# Patient Record
Sex: Female | Born: 1935
Health system: Southern US, Community
[De-identification: ages and names within clinical notes are randomized; demographics above are authoritative.]

## PROBLEM LIST (undated history)

## (undated) DIAGNOSIS — T7840XA Allergy, unspecified, initial encounter: Secondary | ICD-10-CM

## (undated) DIAGNOSIS — C4491 Basal cell carcinoma of skin, unspecified: Secondary | ICD-10-CM

## (undated) DIAGNOSIS — I480 Paroxysmal atrial fibrillation: Secondary | ICD-10-CM

## (undated) DIAGNOSIS — B019 Varicella without complication: Secondary | ICD-10-CM

## (undated) DIAGNOSIS — I5189 Other ill-defined heart diseases: Secondary | ICD-10-CM

## (undated) DIAGNOSIS — Z9289 Personal history of other medical treatment: Secondary | ICD-10-CM

## (undated) DIAGNOSIS — R918 Other nonspecific abnormal finding of lung field: Secondary | ICD-10-CM

## (undated) DIAGNOSIS — R42 Dizziness and giddiness: Secondary | ICD-10-CM

## (undated) DIAGNOSIS — I2699 Other pulmonary embolism without acute cor pulmonale: Secondary | ICD-10-CM

## (undated) DIAGNOSIS — K449 Diaphragmatic hernia without obstruction or gangrene: Secondary | ICD-10-CM

## (undated) DIAGNOSIS — Z87442 Personal history of urinary calculi: Secondary | ICD-10-CM

## (undated) DIAGNOSIS — M1990 Unspecified osteoarthritis, unspecified site: Secondary | ICD-10-CM

## (undated) DIAGNOSIS — R55 Syncope and collapse: Secondary | ICD-10-CM

## (undated) DIAGNOSIS — K219 Gastro-esophageal reflux disease without esophagitis: Secondary | ICD-10-CM

## (undated) DIAGNOSIS — J45909 Unspecified asthma, uncomplicated: Secondary | ICD-10-CM

## (undated) HISTORY — DX: Unspecified asthma, uncomplicated: J45.909

## (undated) HISTORY — DX: Varicella without complication: B01.9

## (undated) HISTORY — DX: Personal history of other medical treatment: Z92.89

## (undated) HISTORY — DX: Personal history of urinary calculi: Z87.442

## (undated) HISTORY — DX: Other nonspecific abnormal finding of lung field: R91.8

## (undated) HISTORY — DX: Gastro-esophageal reflux disease without esophagitis: K21.9

## (undated) HISTORY — DX: Other pulmonary embolism without acute cor pulmonale: I26.99

## (undated) HISTORY — DX: Allergy, unspecified, initial encounter: T78.40XA

## (undated) HISTORY — DX: Paroxysmal atrial fibrillation: I48.0

## (undated) HISTORY — DX: Diaphragmatic hernia without obstruction or gangrene: K44.9

## (undated) HISTORY — DX: Unspecified osteoarthritis, unspecified site: M19.90

---
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
## 1969-09-25 HISTORY — PX: VEIN LIGATION AND STRIPPING: SHX2653

## 1973-09-25 HISTORY — PX: NEPHRECTOMY: SHX65

## 1973-09-25 HISTORY — PX: DILATION AND CURETTAGE OF UTERUS: SHX78

## 2010-11-24 LAB — HM PAP SMEAR: HM PAP: NORMAL

## 2010-11-24 LAB — HM MAMMOGRAPHY: HM MAMMO: NORMAL

## 2011-09-26 HISTORY — PX: REPLACEMENT TOTAL KNEE BILATERAL: SUR1225

## 2013-11-18 DIAGNOSIS — J45909 Unspecified asthma, uncomplicated: Secondary | ICD-10-CM | POA: Diagnosis not present

## 2013-11-18 DIAGNOSIS — E785 Hyperlipidemia, unspecified: Secondary | ICD-10-CM | POA: Diagnosis not present

## 2013-11-18 DIAGNOSIS — M129 Arthropathy, unspecified: Secondary | ICD-10-CM | POA: Diagnosis not present

## 2013-11-18 DIAGNOSIS — I10 Essential (primary) hypertension: Secondary | ICD-10-CM | POA: Diagnosis not present

## 2013-11-19 DIAGNOSIS — L570 Actinic keratosis: Secondary | ICD-10-CM | POA: Diagnosis not present

## 2013-11-19 DIAGNOSIS — D235 Other benign neoplasm of skin of trunk: Secondary | ICD-10-CM | POA: Diagnosis not present

## 2013-11-19 DIAGNOSIS — L820 Inflamed seborrheic keratosis: Secondary | ICD-10-CM | POA: Diagnosis not present

## 2013-11-27 DIAGNOSIS — E785 Hyperlipidemia, unspecified: Secondary | ICD-10-CM | POA: Diagnosis not present

## 2013-11-27 DIAGNOSIS — I10 Essential (primary) hypertension: Secondary | ICD-10-CM | POA: Diagnosis not present

## 2013-11-27 DIAGNOSIS — R42 Dizziness and giddiness: Secondary | ICD-10-CM | POA: Diagnosis not present

## 2013-12-02 DIAGNOSIS — R911 Solitary pulmonary nodule: Secondary | ICD-10-CM | POA: Diagnosis not present

## 2013-12-02 DIAGNOSIS — R918 Other nonspecific abnormal finding of lung field: Secondary | ICD-10-CM | POA: Diagnosis not present

## 2013-12-04 DIAGNOSIS — J449 Chronic obstructive pulmonary disease, unspecified: Secondary | ICD-10-CM | POA: Diagnosis not present

## 2013-12-04 DIAGNOSIS — J45909 Unspecified asthma, uncomplicated: Secondary | ICD-10-CM | POA: Diagnosis not present

## 2013-12-04 DIAGNOSIS — J984 Other disorders of lung: Secondary | ICD-10-CM | POA: Diagnosis not present

## 2013-12-31 DIAGNOSIS — D34 Benign neoplasm of thyroid gland: Secondary | ICD-10-CM | POA: Diagnosis not present

## 2013-12-31 DIAGNOSIS — E042 Nontoxic multinodular goiter: Secondary | ICD-10-CM | POA: Diagnosis not present

## 2013-12-31 DIAGNOSIS — E049 Nontoxic goiter, unspecified: Secondary | ICD-10-CM | POA: Diagnosis not present

## 2014-01-01 DIAGNOSIS — H6120 Impacted cerumen, unspecified ear: Secondary | ICD-10-CM | POA: Diagnosis not present

## 2014-01-06 DIAGNOSIS — J309 Allergic rhinitis, unspecified: Secondary | ICD-10-CM | POA: Diagnosis not present

## 2014-01-06 DIAGNOSIS — J45909 Unspecified asthma, uncomplicated: Secondary | ICD-10-CM | POA: Diagnosis not present

## 2014-01-06 DIAGNOSIS — J069 Acute upper respiratory infection, unspecified: Secondary | ICD-10-CM | POA: Diagnosis not present

## 2014-01-06 DIAGNOSIS — I10 Essential (primary) hypertension: Secondary | ICD-10-CM | POA: Diagnosis not present

## 2014-02-03 DIAGNOSIS — D235 Other benign neoplasm of skin of trunk: Secondary | ICD-10-CM | POA: Diagnosis not present

## 2014-02-03 DIAGNOSIS — L988 Other specified disorders of the skin and subcutaneous tissue: Secondary | ICD-10-CM | POA: Diagnosis not present

## 2014-02-03 DIAGNOSIS — D485 Neoplasm of uncertain behavior of skin: Secondary | ICD-10-CM | POA: Diagnosis not present

## 2014-02-03 DIAGNOSIS — R21 Rash and other nonspecific skin eruption: Secondary | ICD-10-CM | POA: Diagnosis not present

## 2014-04-20 DIAGNOSIS — R21 Rash and other nonspecific skin eruption: Secondary | ICD-10-CM | POA: Diagnosis not present

## 2014-04-28 DIAGNOSIS — R42 Dizziness and giddiness: Secondary | ICD-10-CM | POA: Diagnosis not present

## 2014-04-28 DIAGNOSIS — I10 Essential (primary) hypertension: Secondary | ICD-10-CM | POA: Diagnosis not present

## 2014-04-28 DIAGNOSIS — E042 Nontoxic multinodular goiter: Secondary | ICD-10-CM | POA: Diagnosis not present

## 2014-04-28 DIAGNOSIS — E785 Hyperlipidemia, unspecified: Secondary | ICD-10-CM | POA: Diagnosis not present

## 2014-04-28 DIAGNOSIS — J45909 Unspecified asthma, uncomplicated: Secondary | ICD-10-CM | POA: Diagnosis not present

## 2014-04-28 DIAGNOSIS — D34 Benign neoplasm of thyroid gland: Secondary | ICD-10-CM | POA: Diagnosis not present

## 2014-05-12 DIAGNOSIS — R21 Rash and other nonspecific skin eruption: Secondary | ICD-10-CM | POA: Diagnosis not present

## 2014-05-14 DIAGNOSIS — L259 Unspecified contact dermatitis, unspecified cause: Secondary | ICD-10-CM | POA: Diagnosis not present

## 2014-05-15 DIAGNOSIS — Z471 Aftercare following joint replacement surgery: Secondary | ICD-10-CM | POA: Diagnosis not present

## 2014-05-15 DIAGNOSIS — R21 Rash and other nonspecific skin eruption: Secondary | ICD-10-CM | POA: Diagnosis not present

## 2014-06-15 DIAGNOSIS — J45909 Unspecified asthma, uncomplicated: Secondary | ICD-10-CM | POA: Diagnosis not present

## 2014-06-15 DIAGNOSIS — I9589 Other hypotension: Secondary | ICD-10-CM | POA: Diagnosis not present

## 2014-06-15 DIAGNOSIS — R42 Dizziness and giddiness: Secondary | ICD-10-CM | POA: Diagnosis not present

## 2014-06-15 DIAGNOSIS — T50904A Poisoning by unspecified drugs, medicaments and biological substances, undetermined, initial encounter: Secondary | ICD-10-CM | POA: Diagnosis not present

## 2014-07-30 ENCOUNTER — Encounter: Payer: Self-pay | Admitting: Internal Medicine

## 2014-07-30 ENCOUNTER — Ambulatory Visit (INDEPENDENT_AMBULATORY_CARE_PROVIDER_SITE_OTHER): Payer: Medicare Other | Admitting: Internal Medicine

## 2014-07-30 VITALS — BP 146/78 | HR 76 | Temp 97.9°F | Wt 193.0 lb

## 2014-07-30 DIAGNOSIS — J309 Allergic rhinitis, unspecified: Secondary | ICD-10-CM | POA: Diagnosis not present

## 2014-07-30 DIAGNOSIS — R05 Cough: Secondary | ICD-10-CM

## 2014-07-30 DIAGNOSIS — R059 Cough, unspecified: Secondary | ICD-10-CM

## 2014-07-30 MED ORDER — HYDROCODONE-HOMATROPINE 5-1.5 MG/5ML PO SYRP: 5.0000 mL | ORAL_SOLUTION | Freq: Three times a day (TID) | ORAL | Status: DC | PRN

## 2014-07-30 NOTE — Progress Notes (Signed)
HPI  Pt presents to the clinic today with c/o cough x 1 week. She has had some chest congestion and nasal congestion. She is producing mucous from her nose and her cough. It is clear/white. She reports that she has had fever and chills. She does not have any other URI symptoms. She has tried Delsym and saline nasal rinse. She does have a history of seasonal allergies. She is on Claritin and Flonase.  Review of Systems     No past medical history on file.  No family history on file.  History   Social History  . Marital Status: Widowed    Spouse Name: N/A    Number of Children: N/A  . Years of Education: N/A   Occupational History  . Not on file.   Social History Main Topics  . Smoking status: Never Smoker   . Smokeless tobacco: Never Used  . Alcohol Use: 0.0 oz/week    0 Not specified per week     Comment: rare--wine  . Drug Use: Not on file  . Sexual Activity: Not on file   Other Topics Concern  . Not on file   Social History Narrative  . No narrative on file    No Known Allergies   Constitutional: Positive headache, fatigue and fever. Denies abrupt weight changes.  HEENT:  Positive nasal congestion. Denies eye redness, eye pain, pressure behind the eyes, facial pain, ear pain, ringing in the ears, wax buildup, runny nose or bloody nose. Respiratory: Positive cough. Denies difficulty breathing or shortness of breath.  Cardiovascular: Denies chest pain, chest tightness, palpitations or swelling in the hands or feet.   No other specific complaints in a complete review of systems (except as listed in HPI above).  Objective:   BP 146/78 mmHg  Pulse 76  Temp(Src) 97.9 F (36.6 C) (Oral)  Wt 193 lb (87.544 kg)  SpO2 97%  Wt Readings from Last 3 Encounters:  07/30/14 193 lb (87.544 kg)     General: Appears her stated age, well developed, well nourished in NAD. HEENT: Head: normal shape and size; Eyes: sclera slightly injected, no icterus, conjunctiva pink,  PERRLA and EOMs intact; Ears: Tm's gray and intact, normal light reflex; Nose: mucosa pink and moist, septum midline; Throat/Mouth: + PND. Teeth present, mucosa pink and moist, no exudate noted, no lesions or ulcerations noted.  Cardiovascular: Normal rate and rhythm. S1,S2 noted.  No murmur, rubs or gallops noted.. Pulmonary/Chest: Normal effort and positive vesicular breath sounds. No respiratory distress. No wheezes, rales or ronchi noted.      Assessment & Plan:   Cough, secondary to allergies:  Get some rest and drink plenty of water Continue your Claritin and Flonase Rx for Hycodan cough syrup  Watch for fever, colored mucous or worsening cough with shortness of breath  RTC as needed or if symptoms persist.

## 2014-07-30 NOTE — Patient Instructions (Signed)
Cough, Adult  A cough is a reflex that helps clear your throat and airways. It can help heal the body or may be a reaction to an irritated airway. A cough may only last 2 or 3 weeks (acute) or may last more than 8 weeks (chronic).  CAUSES Acute cough:  Viral or bacterial infections. Chronic cough:  Infections.  Allergies.  Asthma.  Post-nasal drip.  Smoking.  Heartburn or acid reflux.  Some medicines.  Chronic lung problems (COPD).  Cancer. SYMPTOMS   Cough.  Fever.  Chest pain.  Increased breathing rate.  High-pitched whistling sound when breathing (wheezing).  Colored mucus that you cough up (sputum). TREATMENT   A bacterial cough may be treated with antibiotic medicine.  A viral cough must run its course and will not respond to antibiotics.  Your caregiver may recommend other treatments if you have a chronic cough. HOME CARE INSTRUCTIONS   Only take over-the-counter or prescription medicines for pain, discomfort, or fever as directed by your caregiver. Use cough suppressants only as directed by your caregiver.  Use a cold steam vaporizer or humidifier in your bedroom or home to help loosen secretions.  Sleep in a semi-upright position if your cough is worse at night.  Rest as needed.  Stop smoking if you smoke. SEEK IMMEDIATE MEDICAL CARE IF:   You have pus in your sputum.  Your cough starts to worsen.  You cannot control your cough with suppressants and are losing sleep.  You begin coughing up blood.  You have difficulty breathing.  You develop pain which is getting worse or is uncontrolled with medicine.  You have a fever. MAKE SURE YOU:   Understand these instructions.  Will watch your condition.  Will get help right away if you are not doing well or get worse. Document Released: 03/10/2011 Document Revised: 12/04/2011 Document Reviewed: 03/10/2011 Exit[employer] Patient Information 2015 [employer]. This information is not intended  to replace advice given to you by your health care provider. Make sure you discuss any questions you have with your health care provider.  

## 2014-08-13 ENCOUNTER — Encounter: Payer: Self-pay | Admitting: Internal Medicine

## 2014-08-13 ENCOUNTER — Ambulatory Visit (INDEPENDENT_AMBULATORY_CARE_PROVIDER_SITE_OTHER): Payer: Medicare Other | Admitting: Internal Medicine

## 2014-08-13 VITALS — BP 120/76 | HR 75 | Temp 97.9°F | Wt 192.0 lb

## 2014-08-13 DIAGNOSIS — J309 Allergic rhinitis, unspecified: Secondary | ICD-10-CM

## 2014-08-13 DIAGNOSIS — Z9189 Other specified personal risk factors, not elsewhere classified: Secondary | ICD-10-CM

## 2014-08-13 DIAGNOSIS — Z87898 Personal history of other specified conditions: Secondary | ICD-10-CM | POA: Diagnosis not present

## 2014-08-13 DIAGNOSIS — Z23 Encounter for immunization: Secondary | ICD-10-CM

## 2014-08-13 MED ORDER — PREDNISONE 10 MG PO TABS: ORAL_TABLET | ORAL | Status: DC

## 2014-08-13 MED ORDER — AMOXICILLIN 500 MG PO CAPS: ORAL_CAPSULE | ORAL | Status: DC

## 2014-08-13 NOTE — Progress Notes (Signed)
HPI  Pt presents to the clinic today with c/o cough and chest congestion. This has now been going on for the last 4 weeks. She was seen 11/5 for the same, advised this was allergies. She was encouraged to take Claritin and Flonase. She was given Hycodan which did help her cough. However, the cough is still productive of clear/white mucous. She has also tried Delsym and Mucinex. She denies fever, chills or body aches. She has not had sick contacts. She has no history of breathing problems. She is being followed by a pulmonologist because she has "spots on her lungs"  Review of Systems     No past medical history on file.  No family history on file.  History   Social History  . Marital Status: Widowed    Spouse Name: N/A    Number of Children: N/A  . Years of Education: N/A   Occupational History  . Not on file.   Social History Main Topics  . Smoking status: Never Smoker   . Smokeless tobacco: Never Used  . Alcohol Use: 0.0 oz/week    0 Not specified per week     Comment: rare--wine  . Drug Use: Not on file  . Sexual Activity: Not on file   Other Topics Concern  . Not on file   Social History Narrative    No Known Allergies   Constitutional:  Denies headache, fatigue, fever or abrupt weight changes.  HEENT:  Denies eye redness, eye pain, pressure behind the eyes, facial pain, nasal congestion, ear pain, ringing in the ears, wax buildup, runny nose or sore throat. Respiratory: Positive cough, chest congestion or shortness of breath. Denies difficulty breathing or shortness of breath.  Cardiovascular: Denies chest pain, chest tightness, palpitations or swelling in the hands or feet.   No other specific complaints in a complete review of systems (except as listed in HPI above).  Objective:   BP 120/76 mmHg  Pulse 75  Temp(Src) 97.9 F (36.6 C) (Oral)  Wt 192 lb (87.091 kg)  SpO2 98%  Wt Readings from Last 3 Encounters:  08/13/14 192 lb (87.091 kg)  07/30/14 193  lb (87.544 kg)     General: Appears her stated age, well developed, well nourished in NAD. HEENT: Head: normal shape and size;  Ears: Tm's gray and intact, normal light reflex; Nose: mucosa pink and moist, septum midline; Throat/Mouth:Teeth present, mucosa pink and moist, no exudate noted, no lesions or ulcerations noted.  Cardiovascular: Normal rate and rhythm. S1,S2 noted.  No murmur, rubs or gallops noted.  Pulmonary/Chest: Normal effort and diminished vesicular breath sounds. No respiratory distress. No wheezes, rales or ronchi noted.      Assessment & Plan:   Allergic Rhinitis:  Get some rest and drink plenty of water eRx for pred taper Continue Delsym OTC for cough Flu shot today  Prophylaxis prior to dental procedure:  eRx for Amoxil 2 gm PO x 1 30 minutes prior to dental procedure  RTC as needed or if symptoms persist.

## 2014-08-13 NOTE — Patient Instructions (Signed)
Cough, Adult  A cough is a reflex that helps clear your throat and airways. It can help heal the body or may be a reaction to an irritated airway. A cough may only last 2 or 3 weeks (acute) or may last more than 8 weeks (chronic).  CAUSES Acute cough:  Viral or bacterial infections. Chronic cough:  Infections.  Allergies.  Asthma.  Post-nasal drip.  Smoking.  Heartburn or acid reflux.  Some medicines.  Chronic lung problems (COPD).  Cancer. SYMPTOMS   Cough.  Fever.  Chest pain.  Increased breathing rate.  High-pitched whistling sound when breathing (wheezing).  Colored mucus that you cough up (sputum). TREATMENT   A bacterial cough may be treated with antibiotic medicine.  A viral cough must run its course and will not respond to antibiotics.  Your caregiver may recommend other treatments if you have a chronic cough. HOME CARE INSTRUCTIONS   Only take over-the-counter or prescription medicines for pain, discomfort, or fever as directed by your caregiver. Use cough suppressants only as directed by your caregiver.  Use a cold steam vaporizer or humidifier in your bedroom or home to help loosen secretions.  Sleep in a semi-upright position if your cough is worse at night.  Rest as needed.  Stop smoking if you smoke. SEEK IMMEDIATE MEDICAL CARE IF:   You have pus in your sputum.  Your cough starts to worsen.  You cannot control your cough with suppressants and are losing sleep.  You begin coughing up blood.  You have difficulty breathing.  You develop pain which is getting worse or is uncontrolled with medicine.  You have a fever. MAKE SURE YOU:   Understand these instructions.  Will watch your condition.  Will get help right away if you are not doing well or get worse. Document Released: 03/10/2011 Document Revised: 12/04/2011 Document Reviewed: 03/10/2011 Exit[employer] Patient Information 2015 [employer]. This information is not intended  to replace advice given to you by your health care provider. Make sure you discuss any questions you have with your health care provider.  

## 2014-08-13 NOTE — Progress Notes (Signed)
Pre visit review using our clinic review tool, if applicable. No additional management support is needed unless otherwise documented below in the visit note. 

## 2014-09-10 ENCOUNTER — Ambulatory Visit (INDEPENDENT_AMBULATORY_CARE_PROVIDER_SITE_OTHER): Payer: Medicare Other | Admitting: Internal Medicine

## 2014-09-10 ENCOUNTER — Encounter: Payer: Self-pay | Admitting: Internal Medicine

## 2014-09-10 VITALS — BP 124/76 | HR 72 | Temp 98.0°F | Ht 66.25 in | Wt 194.0 lb

## 2014-09-10 DIAGNOSIS — J309 Allergic rhinitis, unspecified: Secondary | ICD-10-CM | POA: Diagnosis not present

## 2014-09-10 DIAGNOSIS — E785 Hyperlipidemia, unspecified: Secondary | ICD-10-CM

## 2014-09-10 DIAGNOSIS — K219 Gastro-esophageal reflux disease without esophagitis: Secondary | ICD-10-CM | POA: Diagnosis not present

## 2014-09-10 DIAGNOSIS — Z87898 Personal history of other specified conditions: Secondary | ICD-10-CM | POA: Diagnosis not present

## 2014-09-10 DIAGNOSIS — M1990 Unspecified osteoarthritis, unspecified site: Secondary | ICD-10-CM

## 2014-09-10 DIAGNOSIS — I10 Essential (primary) hypertension: Secondary | ICD-10-CM | POA: Diagnosis not present

## 2014-09-10 DIAGNOSIS — Z9189 Other specified personal risk factors, not elsewhere classified: Secondary | ICD-10-CM

## 2014-09-10 LAB — COMPREHENSIVE METABOLIC PANEL WITH GFR
ALT: 15 U/L (ref 0–35)
AST: 16 U/L (ref 0–37)
Albumin: 3.7 g/dL (ref 3.5–5.2)
Alkaline Phosphatase: 51 U/L (ref 39–117)
BUN: 16 mg/dL (ref 6–23)
CO2: 25 meq/L (ref 19–32)
Calcium: 9.2 mg/dL (ref 8.4–10.5)
Chloride: 105 meq/L (ref 96–112)
Creatinine, Ser: 0.8 mg/dL (ref 0.4–1.2)
GFR: 78.01 mL/min
Glucose, Bld: 89 mg/dL (ref 70–99)
Potassium: 4.6 meq/L (ref 3.5–5.1)
Sodium: 139 meq/L (ref 135–145)
Total Bilirubin: 0.5 mg/dL (ref 0.2–1.2)
Total Protein: 6.6 g/dL (ref 6.0–8.3)

## 2014-09-10 LAB — CBC
HCT: 36.5 % (ref 36.0–46.0)
Hemoglobin: 11.9 g/dL — ABNORMAL LOW (ref 12.0–15.0)
MCHC: 32.7 g/dL (ref 30.0–36.0)
MCV: 86.7 fl (ref 78.0–100.0)
Platelets: 215 10*3/uL (ref 150.0–400.0)
RBC: 4.21 Mil/uL (ref 3.87–5.11)
RDW: 13.5 % (ref 11.5–15.5)
WBC: 4.6 10*3/uL (ref 4.0–10.5)

## 2014-09-10 LAB — LIPID PANEL
Cholesterol: 140 mg/dL (ref 0–200)
HDL: 51.7 mg/dL
LDL Cholesterol: 68 mg/dL (ref 0–99)
NonHDL: 88.3
Total CHOL/HDL Ratio: 3
Triglycerides: 102 mg/dL (ref 0.0–149.0)
VLDL: 20.4 mg/dL (ref 0.0–40.0)

## 2014-09-10 MED ORDER — LEVOCETIRIZINE DIHYDROCHLORIDE 5 MG PO TABS: 5.0000 mg | ORAL_TABLET | Freq: Every evening | ORAL | Status: DC

## 2014-09-10 MED ORDER — AMOXICILLIN 500 MG PO CAPS: ORAL_CAPSULE | ORAL | Status: DC

## 2014-09-10 MED ORDER — FLUTICASONE PROPIONATE 50 MCG/ACT NA SUSP: 2.0000 | Freq: Every day | NASAL | Status: DC

## 2014-09-10 NOTE — Assessment & Plan Note (Signed)
Occasional She does not want to go back on the PPI- I agree Continue Gaviscon prn

## 2014-09-10 NOTE — Assessment & Plan Note (Signed)
Well controlled on celebrex and tylenol Will check CBC and CMET today

## 2014-09-10 NOTE — Assessment & Plan Note (Signed)
Controlled off meds Will check CBC and CMET today

## 2014-09-10 NOTE — Assessment & Plan Note (Signed)
Flaring Will switch claritin to xyzal Increase Flonase to BID Continue pataday

## 2014-09-10 NOTE — Assessment & Plan Note (Signed)
No issues on zocor Will check CMET and Lipid profile today

## 2014-09-10 NOTE — Progress Notes (Signed)
HPI  Pt presents to the clinic today to establish care.  Asthma: Symptoms controlled on Flovent. Rarely uses albuterol.  Arthritis: Fair control with celebrex. Will take Tylenol occasionally in addition as needed.  GERD: Diet controlled. She was taken off her PPI last year and has been doing well since. Will take Gaviscon about once per week.  Allergies: Symptoms controlled on Claritin, Flonase and Pataday.  HTN: Not medicated.  HLD: She denies myalgias on Zocor.  She does have some concerns today about sinus issues. These did completely clear up with the prednisone taper. She feels a pressure in her sinus but denies blowing colored mucous out of her nose. She denies fever, chills or body aches. She has been taking her prescription medications only.  Flu: 2015 Tetanus: 2011 Pneumovax: 2013 Prevnar: unsure Zostovax: 2013 Pap Smear: 2012 Mammogram: 2012 Bone Density: 2012 Colon Screening: more than 10 years ago Vision Screening: 2014 Dentist: biannually  Past Medical History  Diagnosis Date  . Asthma   . Arthritis   . Chicken pox   . GERD (gastroesophageal reflux disease)   . Allergy   . Hypertension   . History of kidney stones   . Pulmonary embolism on left     Current Outpatient Prescriptions  Medication Sig Dispense Refill  . acetaminophen (TYLENOL) 500 MG tablet Take 500 mg by mouth as needed.    Marland Kitchen albuterol (PROVENTIL HFA;VENTOLIN HFA) 108 (90 BASE) MCG/ACT inhaler Inhale 2 puffs into the lungs every 4 (four) hours as needed for wheezing or shortness of breath.    . Alum Hydroxide-Mag Carbonate (GAVISCON EXTRA STRENGTH) 160-105 MG CHEW Chew 2 tablets by mouth as needed.    Marland Kitchen aspirin 81 MG tablet Take 81 mg by mouth daily.    . celecoxib (CELEBREX) 200 MG capsule Take 200 mg by mouth daily.    . clobetasol cream (TEMOVATE) 4.69 % Apply 1 application topically 2 (two) times daily.    Marland Kitchen docusate sodium (COLACE) 100 MG capsule Take 100 mg by mouth 2 (two) times daily  as needed for mild constipation.    . Fish Oil-Cholecalciferol (OMEGA-3 FISH OIL-VITAMIN D3) 1200-1000 MG-UNIT CAPS Take 1 capsule by mouth daily.    . fluticasone (FLONASE) 50 MCG/ACT nasal spray Place 2 sprays into both nostrils daily.    . fluticasone (FLOVENT HFA) 110 MCG/ACT inhaler Inhale 2 puffs into the lungs 2 (two) times daily.    Donnie Aho (GLUCOSAMINE MSM COMPLEX) TABS Take 1 tablet by mouth 2 (two) times daily.    Marland Kitchen loratadine (CLARITIN) 10 MG tablet Take 10 mg by mouth daily.    . Melatonin 5 MG CAPS Take 1 capsule by mouth at bedtime.    . Multiple Vitamins-Minerals (ONE DAILY MULTIVITAMIN WOMEN PO) Take 1 tablet by mouth daily.    . Olopatadine HCl (PATADAY) 0.2 % SOLN Apply 1 drop to eye daily.    . Polyethylene Glycol 3350 GRAN 17 g by Does not apply route as needed.    . simvastatin (ZOCOR) 10 MG tablet Take 10 mg by mouth daily.    . Wheat Dextrin (BENEFIBER) POWD Take 4 g by mouth as needed.     No current facility-administered medications for this visit.    No Known Allergies  History reviewed. No pertinent family history.  History   Social History  . Marital Status: Widowed    Spouse Name: N/A    Number of Children: N/A  . Years of Education: N/A   Occupational History  .  Not on file.   Social History Main Topics  . Smoking status: Never Smoker   . Smokeless tobacco: Never Used  . Alcohol Use: 0.0 oz/week    0 Not specified per week     Comment: rare--wine  . Drug Use: Not on file  . Sexual Activity: Not on file   Other Topics Concern  . Not on file   Social History Narrative    ROS:  Constitutional: Denies fever, malaise, fatigue, headache or abrupt weight changes.  HEENT: Pt reports runny nose and sinus pressure. Denies eye pain, eye redness, ear pain, ringing in the ears, wax buildup, nasal congestion, bloody nose, or sore throat. Respiratory: Denies difficulty breathing, shortness of breath, cough or sputum production.    Cardiovascular: Denies chest pain, chest tightness, palpitations or swelling in the hands or feet.  Gastrointestinal: Denies abdominal pain, bloating, constipation, diarrhea or blood in the stool.  GU: Denies frequency, urgency, pain with urination, blood in urine, odor or discharge. Musculoskeletal: Pt reports joint pains. Denies decrease in range of motion, difficulty with gait, muscle pain.  Skin: Denies redness, rashes, lesions or ulcercations.  Neurological: Denies dizziness, difficulty with memory, difficulty with speech or problems with balance and coordination.   No other specific complaints in a complete review of systems (except as listed in HPI above).  PE:  Ht 5' 6.25" (1.683 m)  Wt 194 lb (87.998 kg)  BMI 31.07 kg/m2 Wt Readings from Last 3 Encounters:  09/10/14 194 lb (87.998 kg)  08/13/14 192 lb (87.091 kg)  07/30/14 193 lb (87.544 kg)    General: Appears herstated age, chronically ill appearing in NAD. HEENT: Head: normal shape and size, no sinus tenderness noted; Eyes: sclera white, no icterus, conjunctiva pinkt; Ears: Tm's gray and intact, normal light reflex; Nose: mucosa boggy and moist, septum midline; Throat/Mouth: + PND, mucosa pink and moist, no lesions or ulcerations noted.  Neck: No adenopathy noted. Cardiovascular: Normal rate and rhythm. S1,S2 noted.  No murmur, rubs or gallops noted.  No carotid bruits noted. Pulmonary/Chest: Normal effort and positive vesicular breath sounds. No respiratory distress. No wheezes, rales or ronchi noted.  Abdomen: Soft and nontender. Normal bowel sounds, no bruits noted. No distention or masses noted.  Musculoskeletal: Bilateral joint deformity of hands. Strength 5/5 BUE/BLE.  No difficulty with gait.  Neurological: Alert and oriented. Cranial nerves II-XII grossly intact. Coordination normal.     Assessment and Plan:

## 2014-09-10 NOTE — Progress Notes (Signed)
Pre visit review using our clinic review tool, if applicable. No additional management support is needed unless otherwise documented below in the visit note. 

## 2014-09-10 NOTE — Patient Instructions (Signed)

## 2014-09-10 NOTE — Addendum Note (Signed)
Addended by: Lurlean Nanny on: 09/10/2014 11:03 AM   Modules accepted: Orders

## 2014-09-11 ENCOUNTER — Telehealth: Payer: Self-pay | Admitting: Internal Medicine

## 2014-09-11 NOTE — Telephone Encounter (Signed)
emmi mailed  °

## 2014-09-14 ENCOUNTER — Other Ambulatory Visit: Payer: Self-pay | Admitting: Internal Medicine

## 2014-09-14 NOTE — Telephone Encounter (Signed)
Last filled 09/10/14--please advise

## 2014-09-21 ENCOUNTER — Ambulatory Visit (INDEPENDENT_AMBULATORY_CARE_PROVIDER_SITE_OTHER): Payer: Medicare Other | Admitting: Family Medicine

## 2014-09-21 ENCOUNTER — Encounter: Payer: Self-pay | Admitting: Family Medicine

## 2014-09-21 VITALS — BP 136/82 | HR 78 | Temp 98.0°F | Wt 190.0 lb

## 2014-09-21 DIAGNOSIS — J441 Chronic obstructive pulmonary disease with (acute) exacerbation: Secondary | ICD-10-CM | POA: Diagnosis not present

## 2014-09-21 DIAGNOSIS — J189 Pneumonia, unspecified organism: Secondary | ICD-10-CM

## 2014-09-21 MED ORDER — PREDNISONE 20 MG PO TABS: ORAL_TABLET | ORAL | Status: DC

## 2014-09-21 MED ORDER — LEVOFLOXACIN 500 MG PO TABS: 500.0000 mg | ORAL_TABLET | Freq: Every day | ORAL | Status: DC

## 2014-09-21 NOTE — Progress Notes (Signed)
Pre visit review using our clinic review tool, if applicable. No additional management support is needed unless otherwise documented below in the visit note. 

## 2014-09-21 NOTE — Progress Notes (Signed)
Dr. Frederico Hamman T. Myson Levi, MD, [facility] [place], [address] Phone: [phone] Fax: [phone]  09/21/2014  Patient: Julie Davies, MRN: [id number], DOB: 09-16-1927, 80 y.o.  Primary Physician:  Webb Silversmith, NP  Chief Complaint: Cough and Dizziness  Subjective:   Julie Davies is a 80 y.o. very pleasant female patient who presents with the following:  Patient to me who has had what sounds like a history of COPD initially diagnosed at the age of 27. She was initially told her this was asthma, but she recently saw a pulmonologist in [facility] who felt like she more likely had COPD. She does not have a history of smoking since the 1950s, but she was in a household full smokers.  Coughing for 2 months. This morning got up and had to call son in law and had a dizzy spell. "Like tunnel vision and shaky." Has had them before.   COPD / Emphysema - ? If developed in the late 60's / 70's.  But more than 10 days ago she was on the low dose of some prednisone, and this made her feel somewhat better. She is currently afebrile.  Past Medical History, Surgical History, Social History, Family History, Problem List, Medications, and Allergies have been reviewed and updated if relevant.  ROS: GEN: Acute illness details above GI: Tolerating PO intake GU: maintaining adequate hydration and urination Pulm: No SOB Interactive and getting along well at home.  Otherwise, ROS is as per the HPI.   Objective:   BP 136/82 mmHg  Pulse 78  Temp(Src) 98 F (36.7 C) (Oral)  Wt 190 lb (86.183 kg)  SpO2 98%   GEN: A and O x 3. WDWN. NAD.    ENT: Nose clear, ext NML.  No LAD.  No JVD.  TM's clear. Oropharynx clear.  PULM: Normal WOB, no distress. No crackles, wheezes, rhonchi. CV: RRR, no M/G/R, No rubs, No JVD.   EXT: warm and well-perfused, No c/c/e. PSYCH: Pleasant and conversant.    Laboratory and Imaging  Data:  Assessment and Plan:   COPD exacerbation  Atypical pneumonia   Suspect that this is a COPD exacerbation that has a prolonged prior illness from either a viral bronchitis or atypical pneumonia. Pertussis is also possible but less likely. History more impressive than exam  She may have incompletely treated COPD, but her history is that 6 months ago or so ago she  Was not significantly short of breath.  She recently stopped her PPI, and GERD is often a cause of chronic coughs. If her symptoms do not improve, then I would restart this.  New Prescriptions   LEVOFLOXACIN (LEVAQUIN) 500 MG TABLET    Take 1 tablet (500 mg total) by mouth daily.   PREDNISONE (DELTASONE) 20 MG TABLET    2 tabs po daily for 5 days, then 1 po for 5 days   No orders of the defined types were placed in this encounter.    Signed,  Maud Deed. Kaira Stringfield, MD   Patient's Medications  New Prescriptions   LEVOFLOXACIN (LEVAQUIN) 500 MG TABLET    Take 1 tablet (500 mg total) by mouth daily.   PREDNISONE (DELTASONE) 20 MG TABLET    2 tabs po daily for 5 days, then 1 po for 5 days  Previous Medications   ACETAMINOPHEN (TYLENOL) 500 MG TABLET    Take 500 mg by mouth as needed.   ALBUTEROL (PROVENTIL HFA;VENTOLIN HFA) 108 (90  BASE) MCG/ACT INHALER    Inhale 2 puffs into the lungs every 4 (four) hours as needed for wheezing or shortness of breath.   ALUM HYDROXIDE-MAG CARBONATE (GAVISCON EXTRA STRENGTH) 160-105 MG CHEW    Chew 2 tablets by mouth as needed.   AMOXICILLIN (AMOXIL) 500 MG CAPSULE    TAKE 4 CAPSULES 30 MIN PRIOR TO DENTAL APPT   ASPIRIN 81 MG TABLET    Take 81 mg by mouth daily.   CELECOXIB (CELEBREX) 200 MG CAPSULE    Take 200 mg by mouth daily.   CLOBETASOL CREAM (TEMOVATE) 0.05 %    Apply 1 application topically 2 (two) times daily.   DOCUSATE SODIUM (COLACE) 100 MG CAPSULE    Take 100 mg by mouth 2 (two) times daily as needed for mild constipation.   FISH OIL-CHOLECALCIFEROL (OMEGA-3 FISH  OIL-VITAMIN D3) 1200-1000 MG-UNIT CAPS    Take 1 capsule by mouth daily.   FLUTICASONE (FLONASE) 50 MCG/ACT NASAL SPRAY    Place 2 sprays into both nostrils daily.   FLUTICASONE (FLOVENT HFA) 110 MCG/ACT INHALER    Inhale 2 puffs into the lungs 2 (two) times daily.   GLUCOS-MSM-C-MN-GINGER-WILLOW (GLUCOSAMINE MSM COMPLEX) TABS    Take 1 tablet by mouth 2 (two) times daily.   LEVOCETIRIZINE (XYZAL) 5 MG TABLET    Take 1 tablet (5 mg total) by mouth every evening.   MELATONIN 5 MG CAPS    Take 1 capsule by mouth at bedtime.   MULTIPLE VITAMINS-MINERALS (ONE DAILY MULTIVITAMIN WOMEN PO)    Take 1 tablet by mouth daily.   OLOPATADINE HCL (PATADAY) 0.2 % SOLN    Apply 1 drop to eye daily.   POLYETHYLENE GLYCOL 3350 GRAN    17 g by Does not apply route as needed.   SIMVASTATIN (ZOCOR) 10 MG TABLET    Take 10 mg by mouth daily.   WHEAT DEXTRIN (BENEFIBER) POWD    Take 4 g by mouth as needed.  Modified Medications   No medications on file  Discontinued Medications   No medications on file

## 2014-09-23 ENCOUNTER — Telehealth: Payer: Self-pay | Admitting: Family Medicine

## 2014-09-23 NOTE — Telephone Encounter (Signed)
Pt left vm on triage phone stating that she saw Dr. Lorelei Pont this week and he put her on Levaquin.  She saw her dentist today and he put her on amoxicillin and told her to call and make sure it was ok to take them both together.  I know they don't interact with each other but I wanted to double check before we call her back.

## 2014-09-23 NOTE — Telephone Encounter (Signed)
Ok to take both together

## 2014-09-24 NOTE — Telephone Encounter (Signed)
Pt informed

## 2014-10-12 ENCOUNTER — Other Ambulatory Visit: Payer: Self-pay

## 2014-10-12 DIAGNOSIS — J309 Allergic rhinitis, unspecified: Secondary | ICD-10-CM

## 2014-10-12 MED ORDER — FLUTICASONE PROPIONATE 50 MCG/ACT NA SUSP: 2.0000 | Freq: Every day | NASAL | Status: DC

## 2014-10-12 NOTE — Telephone Encounter (Addendum)
Pt said refill for flonase was supposed [facility]; I told pt I would cancel [facility]; pt said no she did not have enough med to wait on mail order and this time would get from [facility]. Pt will let our office know when she needs a [facility]ill [facility].

## 2014-11-12 ENCOUNTER — Other Ambulatory Visit: Payer: Self-pay

## 2014-11-12 DIAGNOSIS — J309 Allergic rhinitis, unspecified: Secondary | ICD-10-CM

## 2014-11-12 MED ORDER — ALBUTEROL SULFATE HFA 108 (90 BASE) MCG/ACT IN AERS: 2.0000 | INHALATION_SPRAY | RESPIRATORY_TRACT | Status: DC | PRN

## 2014-11-12 MED ORDER — FLUTICASONE PROPIONATE 50 MCG/ACT NA SUSP: 2.0000 | Freq: Every day | NASAL | Status: DC

## 2014-11-12 NOTE — Telephone Encounter (Signed)
Pt left note requesting refill fluticasone 50 mcg and ventolin HFA to [facility]. Pt left note using ventolin more often; pt has been seen x 4 since 07/30/14 with respiratory issues. Is it OK to refill?

## 2014-11-23 ENCOUNTER — Other Ambulatory Visit: Payer: Self-pay

## 2014-12-01 DIAGNOSIS — Z961 Presence of intraocular lens: Secondary | ICD-10-CM | POA: Diagnosis not present

## 2014-12-01 DIAGNOSIS — H524 Presbyopia: Secondary | ICD-10-CM | POA: Diagnosis not present

## 2014-12-01 DIAGNOSIS — H52223 Regular astigmatism, bilateral: Secondary | ICD-10-CM | POA: Diagnosis not present

## 2014-12-04 ENCOUNTER — Encounter: Payer: Self-pay | Admitting: Internal Medicine

## 2014-12-04 DIAGNOSIS — R918 Other nonspecific abnormal finding of lung field: Secondary | ICD-10-CM | POA: Diagnosis not present

## 2014-12-04 DIAGNOSIS — R911 Solitary pulmonary nodule: Secondary | ICD-10-CM | POA: Diagnosis not present

## 2014-12-07 ENCOUNTER — Telehealth: Payer: Self-pay

## 2014-12-07 NOTE — Telephone Encounter (Signed)
Pt left v/m; pt has appt with Dr Willaim Bane, pulmonary doctor on 12/08/14 and pt request any pertinent med records faxed to [phone]. Dr Surdulescu contact # is [phone]. Pt is seeing Dr Willaim Bane as f/u for CT scan that found spot on each lung.Please advise.

## 2014-12-07 NOTE — Telephone Encounter (Signed)
Note faxed as requested

## 2014-12-07 NOTE — Telephone Encounter (Signed)
Can fax Dr. Serita Grit last note, otherwise, I have nothing relevant to send

## 2014-12-08 DIAGNOSIS — R918 Other nonspecific abnormal finding of lung field: Secondary | ICD-10-CM | POA: Diagnosis not present

## 2014-12-09 ENCOUNTER — Encounter: Payer: Self-pay | Admitting: Internal Medicine

## 2014-12-09 ENCOUNTER — Ambulatory Visit (INDEPENDENT_AMBULATORY_CARE_PROVIDER_SITE_OTHER): Payer: Medicare Other | Admitting: Internal Medicine

## 2014-12-09 VITALS — BP 142/84 | HR 70 | Temp 97.9°F | Wt 194.0 lb

## 2014-12-09 DIAGNOSIS — J309 Allergic rhinitis, unspecified: Secondary | ICD-10-CM

## 2014-12-09 MED ORDER — PREDNISONE 10 MG PO TABS
ORAL_TABLET | ORAL | Status: DC
Start: 2014-12-09 — End: 2015-03-12

## 2014-12-09 NOTE — Patient Instructions (Signed)

## 2014-12-09 NOTE — Progress Notes (Signed)
HPI  Pt presents to the clinic today with c/o headache, nasal congestion, sore throat and cough. She is not able to blow anything out of her nose. The cough is productive at time of yellow mucous. She does reports itchy, watery eyes. She is taking Zyrtec, Flonase and Pataday without much relief. She denies fever, chills or body aches. She does have a history of allergies and asthma. She has not had sick contacts. She is UTD on her flu and pneumonia vaccines.  Review of Systems    Past Medical History  Diagnosis Date  . Asthma   . Arthritis   . Chicken pox   . GERD (gastroesophageal reflux disease)   . Allergy   . Hypertension   . History of kidney stones   . Pulmonary embolism on left     Family History  Problem Relation Age of Onset  . Heart disease Father   . Heart disease Maternal Aunt   . Stroke Maternal Uncle   . Cancer Neg Hx   . Diabetes Neg Hx     History   Social History  . Marital Status: Widowed    Spouse Name: N/A  . Number of Children: N/A  . Years of Education: N/A   Occupational History  . Not on file.   Social History Main Topics  . Smoking status: Never Smoker   . Smokeless tobacco: Never Used  . Alcohol Use: 0.0 oz/week    0 Standard drinks or equivalent per week     Comment: rare--wine  . Drug Use: No  . Sexual Activity: Not Currently   Other Topics Concern  . Not on file   Social History Narrative    No Known Allergies   Constitutional: Positive headache. Denies fatigue, fever or abrupt weight changes.  HEENT:  Positive facial pain, nasal congestion and sore throat. Denies eye redness, ear pain, ringing in the ears, wax buildup, runny nose or bloody nose. Respiratory: Positive cough. Denies difficulty breathing or shortness of breath.  Cardiovascular: Denies chest pain, chest tightness, palpitations or swelling in the hands or feet.   No other specific complaints in a complete review of systems (except as listed in HPI  above).  Objective:   BP 142/84 mmHg  Pulse 70  Temp(Src) 97.9 F (36.6 C) (Oral)  Wt 194 lb (87.998 kg)  SpO2 97%  General: Appears her stated age, well developed, well nourished in NAD. HEENT: Head: normal shape and size, maxillary sinus tenderness noted; Eyes: sclera injected, no icterus, conjunctiva pink; Ears: Tm's gray and intact, normal light reflex; Nose: mucosa boggy and moist, septum midline; Throat/Mouth: + PND. Teeth present, mucosa pink and moist, no exudate noted, no lesions or ulcerations noted.  Neck: No adenopathy noted. Cardiovascular: Normal rate and rhythm. S1,S2 noted.  No murmur, rubs or gallops noted.  Pulmonary/Chest: Normal effort and positive vesicular breath sounds. No respiratory distress. No wheezes, rales or ronchi noted.      Assessment & Plan:   Allergic Rhinitis  Continue Zyrtec, Flonase and Pataday eRx for low dose pred taper x 6 days If no improvement, consider allergy referral as this is her 3rd appt in 6 months for uncontrolled allergies No s/s of infection so will hold off on abx at this time  RTC as needed or if symptoms persist.

## 2014-12-25 DIAGNOSIS — E042 Nontoxic multinodular goiter: Secondary | ICD-10-CM | POA: Diagnosis not present

## 2015-01-01 DIAGNOSIS — E042 Nontoxic multinodular goiter: Secondary | ICD-10-CM | POA: Diagnosis not present

## 2015-01-01 DIAGNOSIS — D34 Benign neoplasm of thyroid gland: Secondary | ICD-10-CM | POA: Diagnosis not present

## 2015-03-02 ENCOUNTER — Other Ambulatory Visit: Payer: Self-pay | Admitting: Internal Medicine

## 2015-03-11 ENCOUNTER — Encounter: Payer: Self-pay | Admitting: Internal Medicine

## 2015-03-11 ENCOUNTER — Other Ambulatory Visit: Payer: Self-pay

## 2015-03-11 NOTE — Telephone Encounter (Signed)
i do not see where this has been filled by you--please advise

## 2015-03-12 ENCOUNTER — Ambulatory Visit (INDEPENDENT_AMBULATORY_CARE_PROVIDER_SITE_OTHER): Payer: Medicare Other | Admitting: Internal Medicine

## 2015-03-12 ENCOUNTER — Encounter: Payer: Self-pay | Admitting: Internal Medicine

## 2015-03-12 VITALS — BP 142/80 | HR 69 | Temp 98.1°F | Wt 186.0 lb

## 2015-03-12 DIAGNOSIS — R269 Unspecified abnormalities of gait and mobility: Secondary | ICD-10-CM

## 2015-03-12 DIAGNOSIS — K219 Gastro-esophageal reflux disease without esophagitis: Secondary | ICD-10-CM | POA: Diagnosis not present

## 2015-03-12 DIAGNOSIS — R918 Other nonspecific abnormal finding of lung field: Secondary | ICD-10-CM | POA: Insufficient documentation

## 2015-03-12 DIAGNOSIS — M1990 Unspecified osteoarthritis, unspecified site: Secondary | ICD-10-CM | POA: Diagnosis not present

## 2015-03-12 DIAGNOSIS — E785 Hyperlipidemia, unspecified: Secondary | ICD-10-CM

## 2015-03-12 DIAGNOSIS — I10 Essential (primary) hypertension: Secondary | ICD-10-CM | POA: Diagnosis not present

## 2015-03-12 LAB — LIPID PANEL
Cholesterol: 163 mg/dL (ref 0–200)
HDL: 58.7 mg/dL (ref >39.00)
LDL Cholesterol: 86 mg/dL (ref 0–99)
NonHDL: 104.3
Total CHOL/HDL Ratio: 3
Triglycerides: 93 mg/dL (ref 0.0–149.0)
VLDL: 18.6 mg/dL (ref 0.0–40.0)

## 2015-03-12 LAB — CBC
HCT: 38.5 % (ref 36.0–46.0)
HEMOGLOBIN: 12.6 g/dL (ref 12.0–15.0)
MCHC: 32.8 g/dL (ref 30.0–36.0)
MCV: 86.2 fl (ref 78.0–100.0)
PLATELETS: 235 10*3/uL (ref 150.0–400.0)
RBC: 4.47 Mil/uL (ref 3.87–5.11)
RDW: 13.8 % (ref 11.5–15.5)
WBC: 5.6 10*3/uL (ref 4.0–10.5)

## 2015-03-12 LAB — COMPREHENSIVE METABOLIC PANEL
ALK PHOS: 51 U/L (ref 39–117)
ALT: 15 U/L (ref 0–35)
AST: 19 U/L (ref 0–37)
Albumin: 4 g/dL (ref 3.5–5.2)
BILIRUBIN TOTAL: 0.4 mg/dL (ref 0.2–1.2)
BUN: 14 mg/dL (ref 6–23)
CO2: 30 mEq/L (ref 19–32)
CREATININE: 0.78 mg/dL (ref 0.40–1.20)
Calcium: 9.6 mg/dL (ref 8.4–10.5)
Chloride: 102 mEq/L (ref 96–112)
GFR: 74.47 mL/min (ref >60.00)
GLUCOSE: 98 mg/dL (ref 70–99)
POTASSIUM: 4.4 meq/L (ref 3.5–5.1)
SODIUM: 136 meq/L (ref 135–145)
Total Protein: 6.5 g/dL (ref 6.0–8.3)

## 2015-03-12 MED ORDER — CELECOXIB 200 MG PO CAPS
200.0000 mg | ORAL_CAPSULE | Freq: Every day | ORAL | Status: DC
Start: 2015-03-12 — End: 2015-07-27

## 2015-03-12 NOTE — Patient Instructions (Signed)
Fat and Cholesterol Control Diet Fat and cholesterol levels in your blood and organs are influenced by your diet. High levels of fat and cholesterol may lead to diseases of the heart, small and large blood vessels, gallbladder, liver, and pancreas. CONTROLLING FAT AND CHOLESTEROL WITH DIET Although exercise and lifestyle factors are important, your diet is key. That is because certain foods are known to raise cholesterol and others to lower it. The goal is to balance foods for their effect on cholesterol and more importantly, to replace saturated and trans fat with other types of fat, such as monounsaturated fat, polyunsaturated fat, and omega-3 fatty acids. On average, a person should consume no more than 15 to 17 g of saturated fat daily. Saturated and trans fats are considered "bad" fats, and they will raise LDL cholesterol. Saturated fats are primarily found in animal products such as meats, butter, and cream. However, that does not mean you need to give up all your favorite foods. Today, there are good tasting, low-fat, low-cholesterol substitutes for most of the things you like to eat. Choose low-fat or nonfat alternatives. Choose round or loin cuts of red meat. These types of cuts are lowest in fat and cholesterol. Chicken (without the skin), fish, veal, and ground turkey breast are great choices. Eliminate fatty meats, such as hot dogs and salami. Even shellfish have little or no saturated fat. Have a 3 oz (85 g) portion when you eat lean meat, poultry, or fish. Trans fats are also called "partially hydrogenated oils." They are oils that have been scientifically manipulated so that they are solid at room temperature resulting in a longer shelf life and improved taste and texture of foods in which they are added. Trans fats are found in stick margarine, some tub margarines, cookies, crackers, and baked goods.  When baking and cooking, oils are a great substitute for butter. The monounsaturated oils are  especially beneficial since it is believed they lower LDL and raise HDL. The oils you should avoid entirely are saturated tropical oils, such as coconut and palm.  Remember to eat a lot from food groups that are naturally free of saturated and trans fat, including fish, fruit, vegetables, beans, grains (barley, rice, couscous, bulgur wheat), and pasta (without cream sauces).  IDENTIFYING FOODS THAT LOWER FAT AND CHOLESTEROL  Soluble fiber may lower your cholesterol. This type of fiber is found in fruits such as apples, vegetables such as broccoli, potatoes, and carrots, legumes such as beans, peas, and lentils, and grains such as barley. Foods fortified with plant sterols (phytosterol) may also lower cholesterol. You should eat at least 2 g per day of these foods for a cholesterol lowering effect.  Read package labels to identify low-saturated fats, trans fat free, and low-fat foods at the supermarket. Select cheeses that have only 2 to 3 g saturated fat per ounce. Use a heart-healthy tub margarine that is free of trans fats or partially hydrogenated oil. When buying baked goods (cookies, crackers), avoid partially hydrogenated oils. Breads and muffins should be made from whole grains (whole-wheat or whole oat flour, instead of "flour" or "enriched flour"). Buy non-creamy canned soups with reduced salt and no added fats.  FOOD PREPARATION TECHNIQUES  Never deep-fry. If you must fry, either stir-fry, which uses very little fat, or use non-stick cooking sprays. When possible, broil, bake, or roast meats, and steam vegetables. Instead of putting butter or margarine on vegetables, use lemon and herbs, applesauce, and cinnamon (for squash and sweet potatoes). Use nonfat   yogurt, salsa, and low-fat dressings for salads.  LOW-SATURATED FAT / LOW-FAT FOOD SUBSTITUTES Meats / Saturated Fat (g)  Avoid: Steak, marbled (3 oz/85 g) / 11 g  Choose: Steak, lean (3 oz/85 g) / 4 g  Avoid: Hamburger (3 oz/85 g) / 7  g  Choose: Hamburger, lean (3 oz/85 g) / 5 g  Avoid: Ham (3 oz/85 g) / 6 g  Choose: Ham, lean cut (3 oz/85 g) / 2.4 g  Avoid: Chicken, with skin, dark meat (3 oz/85 g) / 4 g  Choose: Chicken, skin removed, dark meat (3 oz/85 g) / 2 g  Avoid: Chicken, with skin, light meat (3 oz/85 g) / 2.5 g  Choose: Chicken, skin removed, light meat (3 oz/85 g) / 1 g Dairy / Saturated Fat (g)  Avoid: Whole milk (1 cup) / 5 g  Choose: Low-fat milk, 2% (1 cup) / 3 g  Choose: Low-fat milk, 1% (1 cup) / 1.5 g  Choose: Skim milk (1 cup) / 0.3 g  Avoid: Hard cheese (1 oz/28 g) / 6 g  Choose: Skim milk cheese (1 oz/28 g) / 2 to 3 g  Avoid: Cottage cheese, 4% fat (1 cup) / 6.5 g  Choose: Low-fat cottage cheese, 1% fat (1 cup) / 1.5 g  Avoid: Ice cream (1 cup) / 9 g  Choose: Sherbet (1 cup) / 2.5 g  Choose: Nonfat frozen yogurt (1 cup) / 0.3 g  Choose: Frozen fruit bar / trace  Avoid: Whipped cream (1 tbs) / 3.5 g  Choose: Nondairy whipped topping (1 tbs) / 1 g Condiments / Saturated Fat (g)  Avoid: Mayonnaise (1 tbs) / 2 g  Choose: Low-fat mayonnaise (1 tbs) / 1 g  Avoid: Butter (1 tbs) / 7 g  Choose: Extra light margarine (1 tbs) / 1 g  Avoid: Coconut oil (1 tbs) / 11.8 g  Choose: Olive oil (1 tbs) / 1.8 g  Choose: Corn oil (1 tbs) / 1.7 g  Choose: Safflower oil (1 tbs) / 1.2 g  Choose: Sunflower oil (1 tbs) / 1.4 g  Choose: Soybean oil (1 tbs) / 2.4 g  Choose: Canola oil (1 tbs) / 1 g Document Released: 09/11/2005 Document Revised: 01/06/2013 Document Reviewed: 12/10/2013 ExitCare Patient Information 2015 [employer]. This information is not intended to replace advice given to you by your health care provider. Make sure you discuss any questions you have with your health care provider.  

## 2015-03-12 NOTE — Assessment & Plan Note (Signed)
Seems to be worse She would like referral to an orthopedic Referral placed Will have her continue Celebrex and Tylenol for now

## 2015-03-12 NOTE — Progress Notes (Signed)
HPI  Pt presents to the clinic today for 6 month follow up of chronic condtions.  Asthma: Symptoms controlled on Flovent. Rarely uses Albuterol.  Arthritis: She reports her symptoms have gotten worse over the last 6 months. She is having more joint pain especially in her hands. She has also noted more deformity in the joints of her hand. She does take Celebrex every morning and Tylenol at night. She would like a referral to an orthopedist for further evaluation.  GERD: Diet controlled. She was taken off her PPI last year and has been doing well since. Will take Gaviscon about once per week.  Allergies: Symptoms controlled on Xyzal, Flonase and Pataday.  HTN: Not medicated. She does report that she has noticed more pain in her left chest that radiates between her shoulders. This occurs only with exertion. She has noticed some shortness of breath with it as well but is not sure if it is related to her asthma or not. Her BP today is 142/80.  HLD: She denies myalgias on Zocor. She does try to consume a low fat diet.  She also reports having more trouble with her gait. She woke up yesterday morning feeling "off"  She denies dizziness or lightheadedness. She reports her legs felt weak. She did not have any chest pain or shortness of breath with this episode. Her symptoms only last 2-3 hours. She is not sure what may have caused it. She denied blurred vision, difficulty with speech or swallowing. She denies unilateral weakness. She has not fallen recently and always walks with a cane. She is back to normal today.  She is also due to follow up her pulmonary nodules. She does not want to go all the way back to [facility] to see her pulmonologist there. She would like to be referred to a pulmonologist here.  Past Medical History  Diagnosis Date  . Asthma   . Arthritis   . Chicken pox   . GERD (gastroesophageal reflux disease)   . Allergy   . Hypertension   . History of kidney stones   . Pulmonary  embolism on left     Current Outpatient Prescriptions  Medication Sig Dispense Refill  . acetaminophen (TYLENOL) 500 MG tablet Take 500 mg by mouth as needed.    Marland Kitchen albuterol (PROVENTIL HFA;VENTOLIN HFA) 108 (90 BASE) MCG/ACT inhaler Inhale 2 puffs into the lungs every 4 (four) hours as needed for wheezing or shortness of breath. 18 g 3  . Alum Hydroxide-Mag Carbonate (GAVISCON EXTRA STRENGTH) 160-105 MG CHEW Chew 2 tablets by mouth as needed.    Marland Kitchen amoxicillin (AMOXIL) 500 MG capsule TAKE 4 CAPSULES 30 MIN PRIOR TO DENTAL APPT 4 capsule 0  . aspirin 81 MG tablet Take 81 mg by mouth daily.    . celecoxib (CELEBREX) 200 MG capsule Take 200 mg by mouth daily.    . clobetasol cream (TEMOVATE) 8.31 % Apply 1 application topically 2 (two) times daily.    Marland Kitchen docusate sodium (COLACE) 100 MG capsule Take 100 mg by mouth 2 (two) times daily as needed for mild constipation.    . Fish Oil-Cholecalciferol (OMEGA-3 FISH OIL-VITAMIN D3) 1200-1000 MG-UNIT CAPS Take 1 capsule by mouth daily.    . fluticasone (FLONASE) 50 MCG/ACT nasal spray Place 2 sprays into both nostrils daily. 48 g 3  . fluticasone (FLOVENT HFA) 110 MCG/ACT inhaler Inhale 2 puffs into the lungs 2 (two) times daily.    Donnie Aho (GLUCOSAMINE MSM COMPLEX) TABS Take 1 tablet by  mouth 2 (two) times daily.    Marland Kitchen levocetirizine (XYZAL) 5 MG tablet Take 1 tablet (5 mg total) by mouth every evening. 30 tablet 0  . Melatonin 5 MG CAPS Take 1 capsule by mouth at bedtime.    . Multiple Vitamins-Minerals (ONE DAILY MULTIVITAMIN WOMEN PO) Take 1 tablet by mouth daily.    . Olopatadine HCl (PATADAY) 0.2 % SOLN Apply 1 drop to eye daily.    . Polyethylene Glycol 3350 GRAN 17 g by Does not apply route as needed.    . predniSONE (DELTASONE) 10 MG tablet Take 3 tabs on days 1-2, take 2 tabs on days 3-4, take 1 tab on days 5-6 12 tablet 0  . simvastatin (ZOCOR) 10 MG tablet Take 10 mg by mouth daily.    . Wheat Dextrin (BENEFIBER) POWD  Take 4 g by mouth as needed.     No current facility-administered medications for this visit.    No Known Allergies  Family History  Problem Relation Age of Onset  . Heart disease Father   . Heart disease Maternal Aunt   . Stroke Maternal Uncle   . Cancer Neg Hx   . Diabetes Neg Hx     History   Social History  . Marital Status: Widowed    Spouse Name: N/A  . Number of Children: N/A  . Years of Education: N/A   Occupational History  . Not on file.   Social History Main Topics  . Smoking status: Never Smoker   . Smokeless tobacco: Never Used  . Alcohol Use: 0.0 oz/week    0 Standard drinks or equivalent per week     Comment: rare--wine  . Drug Use: No  . Sexual Activity: Not Currently   Other Topics Concern  . Not on file   Social History Narrative    ROS:  Constitutional: Denies fever, malaise, fatigue, headache or abrupt weight changes.  HEENT:  Denies eye pain, eye redness, ear pain, ringing in the ears, wax buildup, nasal congestion, runny nose, or sore throat. Respiratory: Pt reports dyspnea on exertion. Denies difficulty breathing, shortness of breath, cough or sputum production.   Cardiovascular: Pt reports chest tightness with exertion, Denies chest pain, palpitations or swelling in the hands or feet.  Gastrointestinal: Denies abdominal pain, bloating, constipation, diarrhea or blood in the stool.  GU: Pt reports urinary incontinence. Denies frequency, urgency, pain with urination, blood in urine, odor or discharge. Musculoskeletal: Pt reports joint pains. Denies decrease in range of motion, difficulty with gait, muscle pain.  Skin: Denies redness, rashes, lesions or ulcercations.  Neurological: Pt reports problems with balance. Denies dizziness, difficulty with memory, difficulty with speech or problems with coordination.  Psych: Denies anxiety, depression, SI/HI.  No other specific complaints in a complete review of systems (except as listed in HPI  above).  PE:  BP 142/80 mmHg  Pulse 69  Temp(Src) 98.1 F (36.7 C) (Oral)  Wt 186 lb (84.369 kg)  SpO2 98%  Wt Readings from Last 3 Encounters:  12/09/14 194 lb (87.998 kg)  09/21/14 190 lb (86.183 kg)  09/10/14 194 lb (87.998 kg)    General: Appears her stated age, chronically ill appearing in NAD. HEENT: Head: normal shape and size, no sinus tenderness noted; Eyes: sclera white, no icterus, conjunctiva pinkt; Ears: Tm's gray and intact, normal light reflex; Nose: mucosa boggy and moist, septum midline; Throat/Mouth: mucosa pink and moist, no lesions or ulcerations noted.  Neck: No adenopathy noted. Cardiovascular: Normal rate and rhythm.  S1,S2 noted.  No murmur, rubs or gallops noted.  No carotid bruits noted. Pulmonary/Chest: Normal effort and positive vesicular breath sounds. No respiratory distress. No wheezes, rales or ronchi noted.  Abdomen: Soft and nontender. Normal bowel sounds, no bruits noted. No distention or masses noted.  Musculoskeletal: Bilateral joint deformity of hands. Strength 5/5 BUE/BLE.  No difficulty with gait.  Neurological: Alert and oriented. Cranial nerves II-XII grossly intact. Coordination normal.    Assessment and Plan:  Impaired gait and balance:  Has returned to normal It is likely that she could of had a TIA ECG today Last LDL 68, but Lipid Profile will be repeated today Continue baby ASA daily She does not want to do a carotid ultrasound at this time  RTC in 6 months or sooner if needed

## 2015-03-12 NOTE — Assessment & Plan Note (Signed)
Will repeat Lipid Profile and CMET today Handout given on a low fat diet

## 2015-03-12 NOTE — Assessment & Plan Note (Addendum)
BP at goal off meds Given chest pressure and DOE will obtain ECG today- PAC's, no acute changes, possible old anterior infarct. Will check CBC and CMET

## 2015-03-12 NOTE — Assessment & Plan Note (Signed)
Controlled on Flonase, Xyzal and Pataday

## 2015-03-12 NOTE — Assessment & Plan Note (Signed)
Will request records from previous pulmonologist Will place referral to pulmonology in [facility]

## 2015-03-12 NOTE — Assessment & Plan Note (Signed)
Occasional Continue Gaviscon prn

## 2015-03-12 NOTE — Progress Notes (Signed)
Pre visit review using our clinic review tool, if applicable. No additional management support is needed unless otherwise documented below in the visit note. 

## 2015-03-15 ENCOUNTER — Encounter: Payer: Self-pay | Admitting: Internal Medicine

## 2015-03-19 ENCOUNTER — Other Ambulatory Visit: Payer: Self-pay

## 2015-03-19 DIAGNOSIS — M25562 Pain in left knee: Secondary | ICD-10-CM | POA: Diagnosis not present

## 2015-03-19 DIAGNOSIS — Z96652 Presence of left artificial knee joint: Secondary | ICD-10-CM | POA: Diagnosis not present

## 2015-03-19 DIAGNOSIS — Z96651 Presence of right artificial knee joint: Secondary | ICD-10-CM | POA: Diagnosis not present

## 2015-03-19 DIAGNOSIS — M5416 Radiculopathy, lumbar region: Secondary | ICD-10-CM | POA: Diagnosis not present

## 2015-03-19 DIAGNOSIS — M25561 Pain in right knee: Secondary | ICD-10-CM | POA: Diagnosis not present

## 2015-03-19 MED ORDER — SIMVASTATIN 10 MG PO TABS: 10.0000 mg | ORAL_TABLET | Freq: Every day | ORAL | Status: DC

## 2015-03-25 ENCOUNTER — Other Ambulatory Visit: Payer: Self-pay | Admitting: Orthopedic Surgery

## 2015-03-25 DIAGNOSIS — M5416 Radiculopathy, lumbar region: Secondary | ICD-10-CM

## 2015-03-30 ENCOUNTER — Ambulatory Visit
Admission: RE | Admit: 2015-03-30 | Discharge: 2015-03-30 | Disposition: A | Discharge status: Home / Self Care | Payer: Medicare Other | Source: Ambulatory Visit | Attending: Orthopedic Surgery | Admitting: Orthopedic Surgery

## 2015-03-30 DIAGNOSIS — M4807 Spinal stenosis, lumbosacral region: Secondary | ICD-10-CM | POA: Diagnosis not present

## 2015-03-30 DIAGNOSIS — M4806 Spinal stenosis, lumbar region: Secondary | ICD-10-CM | POA: Insufficient documentation

## 2015-03-30 DIAGNOSIS — M5416 Radiculopathy, lumbar region: Secondary | ICD-10-CM

## 2015-04-01 DIAGNOSIS — M25561 Pain in right knee: Secondary | ICD-10-CM | POA: Diagnosis not present

## 2015-04-01 DIAGNOSIS — M5417 Radiculopathy, lumbosacral region: Secondary | ICD-10-CM | POA: Diagnosis not present

## 2015-04-01 DIAGNOSIS — M545 Low back pain: Secondary | ICD-10-CM | POA: Diagnosis not present

## 2015-04-01 DIAGNOSIS — M25562 Pain in left knee: Secondary | ICD-10-CM | POA: Diagnosis not present

## 2015-04-06 DIAGNOSIS — M545 Low back pain: Secondary | ICD-10-CM | POA: Diagnosis not present

## 2015-04-06 DIAGNOSIS — M5417 Radiculopathy, lumbosacral region: Secondary | ICD-10-CM | POA: Diagnosis not present

## 2015-04-09 DIAGNOSIS — M5417 Radiculopathy, lumbosacral region: Secondary | ICD-10-CM | POA: Diagnosis not present

## 2015-04-09 DIAGNOSIS — M545 Low back pain: Secondary | ICD-10-CM | POA: Diagnosis not present

## 2015-04-12 DIAGNOSIS — M5416 Radiculopathy, lumbar region: Secondary | ICD-10-CM | POA: Diagnosis not present

## 2015-04-12 DIAGNOSIS — M4806 Spinal stenosis, lumbar region: Secondary | ICD-10-CM | POA: Diagnosis not present

## 2015-04-13 DIAGNOSIS — M545 Low back pain: Secondary | ICD-10-CM | POA: Diagnosis not present

## 2015-04-13 DIAGNOSIS — M5417 Radiculopathy, lumbosacral region: Secondary | ICD-10-CM | POA: Diagnosis not present

## 2015-04-14 ENCOUNTER — Encounter: Payer: Self-pay | Admitting: Internal Medicine

## 2015-04-14 ENCOUNTER — Ambulatory Visit (INDEPENDENT_AMBULATORY_CARE_PROVIDER_SITE_OTHER): Payer: Medicare Other | Admitting: Internal Medicine

## 2015-04-14 VITALS — BP 128/78 | HR 69 | Temp 97.8°F | Wt 185.0 lb

## 2015-04-14 DIAGNOSIS — Q6100 Congenital renal cyst, unspecified: Secondary | ICD-10-CM

## 2015-04-14 DIAGNOSIS — N281 Cyst of kidney, acquired: Secondary | ICD-10-CM

## 2015-04-14 NOTE — Progress Notes (Signed)
Subjective:    Patient ID: Julie Davies, female    DOB: 1927/02/27, 81 y.o.   MRN: [id number]  HPI  Pt presents to the clinic today to discuss her MRI. The MRI showed that she had a few cysts in her left kidney. This concerns her because she has already had a right nephrectomy. She has not had any dysuria, urgency or frequency. She does reports some stress incontinence but she expects this for her age. BMET from 02/2015 reviewed- kidney function was normal.  Review of Systems      Past Medical History  Diagnosis Date  . Asthma   . Arthritis   . Chicken pox   . GERD (gastroesophageal reflux disease)   . Allergy   . Hypertension   . History of kidney stones   . Pulmonary embolism on left     Current Outpatient Prescriptions  Medication Sig Dispense Refill  . acetaminophen (TYLENOL) 500 MG tablet Take 500 mg by mouth as needed.    Marland Kitchen albuterol (PROVENTIL HFA;VENTOLIN HFA) 108 (90 BASE) MCG/ACT inhaler Inhale 2 puffs into the lungs every 4 (four) hours as needed for wheezing or shortness of breath. 18 g 3  . Alum Hydroxide-Mag Carbonate (GAVISCON EXTRA STRENGTH) 160-105 MG CHEW Chew 2 tablets by mouth as needed.    Marland Kitchen amoxicillin (AMOXIL) 500 MG capsule TAKE 4 CAPSULES 30 MIN PRIOR TO DENTAL APPT 4 capsule 0  . aspirin 81 MG tablet Take 81 mg by mouth daily.    . celecoxib (CELEBREX) 200 MG capsule Take 1 capsule (200 mg total) by mouth daily. 90 capsule 1  . clobetasol cream (TEMOVATE) 8.67 % Apply 1 application topically 2 (two) times daily.    Marland Kitchen docusate sodium (COLACE) 100 MG capsule Take 100 mg by mouth 2 (two) times daily as needed for mild constipation.    . Fish Oil-Cholecalciferol (OMEGA-3 FISH OIL-VITAMIN D3) 1200-1000 MG-UNIT CAPS Take 1 capsule by mouth daily.    . fluticasone (FLONASE) 50 MCG/ACT nasal spray Place 2 sprays into both nostrils daily. 48 g 3  . fluticasone (FLOVENT HFA) 110 MCG/ACT inhaler Inhale 2 puffs into the lungs 2 (two) times daily.    Donnie Aho (GLUCOSAMINE MSM COMPLEX) TABS Take 1 tablet by mouth 2 (two) times daily.    Marland Kitchen levocetirizine (XYZAL) 5 MG tablet Take 1 tablet (5 mg total) by mouth every evening. 30 tablet 0  . Melatonin 5 MG CAPS Take 1 capsule by mouth at bedtime.    . Multiple Vitamins-Minerals (ONE DAILY MULTIVITAMIN WOMEN PO) Take 1 tablet by mouth daily.    . Olopatadine HCl (PATADAY) 0.2 % SOLN Apply 1 drop to eye daily.    . Polyethylene Glycol 3350 GRAN 17 g by Does not apply route as needed.    . simvastatin (ZOCOR) 10 MG tablet Take 1 tablet (10 mg total) by mouth daily. 90 tablet 1  . Wheat Dextrin (BENEFIBER) POWD Take 4 g by mouth as needed.     No current facility-administered medications for this visit.    No Known Allergies  Family History  Problem Relation Age of Onset  . Heart disease Father   . Heart disease Maternal Aunt   . Stroke Maternal Uncle   . Cancer Neg Hx   . Diabetes Neg Hx     History   Social History  . Marital Status: Widowed    Spouse Name: N/A  . Number of Children: N/A  . Years of Education: N/A  Occupational History  . Not on file.   Social History Main Topics  . Smoking status: Never Smoker   . Smokeless tobacco: Never Used  . Alcohol Use: 0.0 oz/week    0 Standard drinks or equivalent per week     Comment: rare--wine  . Drug Use: No  . Sexual Activity: Not Currently   Other Topics Concern  . Not on file   Social History Narrative     Constitutional: Denies fever, malaise, fatigue, headache or abrupt weight changes.  Respiratory: Denies difficulty breathing, shortness of breath, cough or sputum production.   Cardiovascular: Denies chest pain, chest tightness, palpitations or swelling in the hands or feet.  Gastrointestinal: Denies abdominal pain, bloating, constipation, diarrhea or blood in the stool.  GU: Denies urgency, frequency, pain with urination, burning sensation, blood in urine, odor or discharge.   No other  specific complaints in a complete review of systems (except as listed in HPI above).  Objective:   Physical Exam   BP 128/78 mmHg  Pulse 69  Temp(Src) 97.8 F (36.6 C) (Oral)  Wt 185 lb (83.915 kg)  SpO2 97% Wt Readings from Last 3 Encounters:  04/14/15 185 lb (83.915 kg)  03/12/15 186 lb (84.369 kg)  12/09/14 194 lb (87.998 kg)    General: Appears her stated age, well developed, well nourished in NAD. Cardiovascular: Normal rate and rhythm. S1,S2 noted.   Pulmonary/Chest: Normal effort and positive vesicular breath sounds. No respiratory distress. No wheezes, rales or ronchi noted.  Abdomen: Soft and nontender. Normal bowel sounds, no bruits noted. No distention or masses noted. Liver, spleen and kidneys non palpable. No CVA tenderness.   BMET    Component Value Date/Time   NA 136 03/12/2015 1407   K 4.4 03/12/2015 1407   CL 102 03/12/2015 1407   CO2 30 03/12/2015 1407   GLUCOSE 98 03/12/2015 1407   BUN 14 03/12/2015 1407   CREATININE 0.78 03/12/2015 1407   CALCIUM 9.6 03/12/2015 1407    Lipid Panel     Component Value Date/Time   CHOL 163 03/12/2015 1407   TRIG 93.0 03/12/2015 1407   HDL 58.70 03/12/2015 1407   CHOLHDL 3 03/12/2015 1407   VLDL 18.6 03/12/2015 1407   LDLCALC 86 03/12/2015 1407    CBC    Component Value Date/Time   WBC 5.6 03/12/2015 1407   RBC 4.47 03/12/2015 1407   HGB 12.6 03/12/2015 1407   HCT 38.5 03/12/2015 1407   PLT 235.0 03/12/2015 1407   MCV 86.2 03/12/2015 1407   MCHC 32.8 03/12/2015 1407   RDW 13.8 03/12/2015 1407    Hgb A1C No results found for: HGBA1C      Assessment & Plan:   Cysts in left kidney:  Reassured her this is benign and no intervention needed Her kidney is functioning normal This is something we will continue to monitor All questions answered  RTC as needed

## 2015-04-14 NOTE — Progress Notes (Signed)
Pre visit review using our clinic review tool, if applicable. No additional management support is needed unless otherwise documented below in the visit note. 

## 2015-04-14 NOTE — Patient Instructions (Signed)
Epidermal Cyst An epidermal cyst is sometimes called a sebaceous cyst, epidermal inclusion cyst, or infundibular cyst. These cysts usually contain a substance that looks "pasty" or "cheesy" and may have a bad smell. This substance is a protein called keratin. Epidermal cysts are usually found on the face, neck, or trunk. They may also occur in the vaginal area or other parts of the genitalia of both men and women. Epidermal cysts are usually small, painless, slow-growing bumps or lumps that move freely under the skin. It is important not to try to pop them. This may cause an infection and lead to tenderness and swelling. CAUSES  Epidermal cysts may be caused by a deep penetrating injury to the skin or a plugged hair follicle, often associated with acne. SYMPTOMS  Epidermal cysts can become inflamed and cause:  Redness.  Tenderness.  Increased temperature of the skin over the bumps or lumps.  Grayish-white, bad smelling material that drains from the bump or lump. DIAGNOSIS  Epidermal cysts are easily diagnosed by your caregiver during an exam. Rarely, a tissue sample (biopsy) may be taken to rule out other conditions that may resemble epidermal cysts. TREATMENT   Epidermal cysts often get better and disappear on their own. They are rarely ever cancerous.  If a cyst becomes infected, it may become inflamed and tender. This may require opening and draining the cyst. Treatment with antibiotics may be necessary. When the infection is gone, the cyst may be removed with minor surgery.  Small, inflamed cysts can often be treated with antibiotics or by injecting steroid medicines.  Sometimes, epidermal cysts become large and bothersome. If this happens, surgical removal in your caregiver's office may be necessary. HOME CARE INSTRUCTIONS  Only take over-the-counter or prescription medicines as directed by your caregiver.  Take your antibiotics as directed. Finish them even if you start to feel  better. SEEK MEDICAL CARE IF:   Your cyst becomes tender, red, or swollen.  Your condition is not improving or is getting worse.  You have any other questions or concerns. MAKE SURE YOU:  Understand these instructions.  Will watch your condition.  Will get help right away if you are not doing well or get worse. Document Released: 08/12/2004 Document Revised: 12/04/2011 Document Reviewed: 03/20/2011 ExitCare Patient Information 2015 [employer]. This information is not intended to replace advice given to you by your health care provider. Make sure you discuss any questions you have with your health care provider.  

## 2015-04-15 DIAGNOSIS — M545 Low back pain: Secondary | ICD-10-CM | POA: Diagnosis not present

## 2015-04-15 DIAGNOSIS — M5417 Radiculopathy, lumbosacral region: Secondary | ICD-10-CM | POA: Diagnosis not present

## 2015-04-19 ENCOUNTER — Ambulatory Visit (INDEPENDENT_AMBULATORY_CARE_PROVIDER_SITE_OTHER): Payer: Medicare Other | Admitting: Internal Medicine

## 2015-04-19 ENCOUNTER — Encounter: Payer: Self-pay | Admitting: Internal Medicine

## 2015-04-19 VITALS — BP 138/80 | HR 89 | Temp 97.9°F | Ht 67.0 in | Wt 188.0 lb

## 2015-04-19 DIAGNOSIS — J439 Emphysema, unspecified: Secondary | ICD-10-CM | POA: Diagnosis not present

## 2015-04-19 DIAGNOSIS — R918 Other nonspecific abnormal finding of lung field: Secondary | ICD-10-CM | POA: Diagnosis not present

## 2015-04-19 DIAGNOSIS — J449 Chronic obstructive pulmonary disease, unspecified: Secondary | ICD-10-CM | POA: Insufficient documentation

## 2015-04-19 NOTE — Assessment & Plan Note (Signed)
Records obtained from previous pulmonologist Dr.Sever C Surdulescu [facility] [phone]) Apparently patient had a bronchitis/pneumonia earlier in the year, discussed follow-up with a PET CT, PET CT showed possible new lesions or new pulmonary nodules (did not get a copy of this PET/CT), a repeat CT was done followed up in July 2016 that showed 8 x 74mm enlarging right upper lobe nodule along with a 1.4 x 1.7 cm right upper lobe nodule, also. Given that she had a recent infection, these were felt to be reactive or inflammatory but malignancy cannot be completely excluded.  Plan: Repeat CT with contrast prior to follow-up

## 2015-04-19 NOTE — Assessment & Plan Note (Signed)
Symptoms are mild at this time mostly mild dyspnea on exertion. Continue with allergy control. Continue with Flovent 110, 2 puffs twice a day Patient advised avoid  any forms of tobacco including secondhand smoke, electronic cigarettes, and vapors.

## 2015-04-19 NOTE — Patient Instructions (Addendum)
Follow up with Dr. Stevenson Clinch in 4  months - pulmonary function testing and 6 minute walk test prior to follow up. - repeat CT of Chest with contrast prior to follow up visit for history of pulmonary nodules.  - cont with flovent. - cont with current allergy regiment.  - cont with as needed albuterol - cont with exercise as tolerated.  - we will obtain a copy of your records from your last pulmonologist (Dr. Christin Bach Surdulescu, [facility] [phone], Fax [phone]).

## 2015-04-19 NOTE — Progress Notes (Signed)
Date: 04/19/2015  MRN# [id number] Julie Davies [birth year]  Referring Physician: NP Webb Silversmith  Julie Davies is a 81 y.o. old female seen in consultation for asthma and lung nodules  CC:  Chief Complaint  Patient presents with  . Advice Only    Pt referred by PCP Webb Silversmith for Pulmonary nodules. Previous pulmonary Dr. at [facility]--Dr. Surdulescu. Pt here to establish care she has sob and mild chest tightness.    HPI:  Patient is a pleasant 81 year old female seen in consultation today for evaluation of lung nodules and asthma optimization. Patient has a history of lung nodules. He follow-up pulmonologist in [place], review of records show that she was following his earliest 2014 for 2 small 8 mm lower lobe nodules with mild adenopathy. Nodules are nonspecific in a patient with a history of choking, hiatal hernia and possible aspiration. At that time surveillance was recommended. Patient stated that she had her last CT in March 2016 at all pulmonologist office. Patient is a prolonged history of asthma, smoked for about 10 years quit in the 1950s, had significant secondhand smoking exposure from appearance. For her asthma she is currently on as needed albuterol, and Flovent. Patient states she has a mild intermittent slightly productive cough at times, has not had to use albuterol rescue inhaler in a few months. However she did state that prior to walking she will take 2 puffs of albuterol. Patient is a former Nature conservation officer, had several Torres in [place] and station in various areas throughout the [place] including [place], [place], [place]. Patient states she has mild dizziness today but this is a chronic problem, she lives with her daughter, she can walk up and down stairs with mild dyspnea. Review of records showed she had one asthma exacerbation in 2015 that was treated with antibiotics and steroids. As for her pulmonary nodules based on her CAT scan from 2016 patient  states she has 2 new nodules for a total of 4 nodules. At today's visit there is no CD with CT scans to compare previous lung windows. Allergies: Symptoms controlled on Xyzal, Flonase and Pataday. PMHX:   Past Medical History  Diagnosis Date  . Asthma   . Arthritis   . Chicken pox   . GERD (gastroesophageal reflux disease)   . Allergy   . Hypertension   . History of kidney stones   . Pulmonary embolism on left    Surgical Hx:  Past Surgical History  Procedure Laterality Date  . Appendectomy  1935  . Vein ligation and stripping Left 1971  . Nephrectomy Right 1975  . Replacement total knee bilateral  2013  . Dilation and curettage of uterus  1975   Family Hx:  Family History  Problem Relation Age of Onset  . Heart disease Father   . Heart disease Maternal Aunt   . Stroke Maternal Uncle   . Cancer Neg Hx   . Diabetes Neg Hx    Social Hx:   History  Substance Use Topics  . Smoking status: Never Smoker   . Smokeless tobacco: Never Used  . Alcohol Use: 0.0 oz/week    0 Standard drinks or equivalent per week     Comment: rare--wine   Medication:   Current Outpatient Rx  Name  Route  Sig  Dispense  Refill  . acetaminophen (TYLENOL) 500 MG tablet   Oral   Take 500 mg by mouth as needed.         Marland Kitchen albuterol (PROVENTIL HFA;VENTOLIN HFA)  108 (90 BASE) MCG/ACT inhaler   Inhalation   Inhale 2 puffs into the lungs every 4 (four) hours as needed for wheezing or shortness of breath.   18 g   3   . Alum Hydroxide-Mag Carbonate (GAVISCON EXTRA STRENGTH) 160-105 MG CHEW   Oral   Chew 2 tablets by mouth as needed.         Marland Kitchen amoxicillin (AMOXIL) 500 MG capsule      TAKE 4 CAPSULES 30 MIN PRIOR TO DENTAL APPT   4 capsule   0   . aspirin 81 MG tablet   Oral   Take 81 mg by mouth daily.         . celecoxib (CELEBREX) 200 MG capsule   Oral   Take 1 capsule (200 mg total) by mouth daily.   90 capsule   1   . clobetasol cream (TEMOVATE) 0.05 %   Topical   Apply 1  application topically as needed.          . docusate sodium (COLACE) 100 MG capsule   Oral   Take 100 mg by mouth 2 (two) times daily as needed for mild constipation.         . Fish Oil-Cholecalciferol (OMEGA-3 FISH OIL-VITAMIN D3) 1200-1000 MG-UNIT CAPS   Oral   Take 1 capsule by mouth daily.         . fluticasone (FLONASE) 50 MCG/ACT nasal spray   Each Nare   Place 2 sprays into both nostrils daily.   48 g   3   . fluticasone (FLOVENT HFA) 110 MCG/ACT inhaler   Inhalation   Inhale 2 puffs into the lungs 2 (two) times daily.         Donnie Aho (GLUCOSAMINE MSM COMPLEX) TABS   Oral   Take 1 tablet by mouth 2 (two) times daily.         Marland Kitchen levocetirizine (XYZAL) 5 MG tablet   Oral   Take 1 tablet (5 mg total) by mouth every evening.   30 tablet   0   . Melatonin 5 MG CAPS   Oral   Take 1 capsule by mouth at bedtime.         . Multiple Vitamins-Minerals (ONE DAILY MULTIVITAMIN WOMEN PO)   Oral   Take 1 tablet by mouth daily.         . Olopatadine HCl (PATADAY) 0.2 % SOLN   Ophthalmic   Apply 1 drop to eye daily.         . Polyethylene Glycol 3350 GRAN   Does not apply   17 g by Does not apply route as needed.         . simvastatin (ZOCOR) 10 MG tablet   Oral   Take 1 tablet (10 mg total) by mouth daily.   90 tablet   1   . Wheat Dextrin (BENEFIBER) POWD   Oral   Take 4 g by mouth as needed.             Allergies:  Review of patient's allergies indicates no known allergies.  Review of Systems: Gen:  Denies  fever, sweats, chills HEENT: Denies blurred vision, double vision, ear pain, eye pain, hearing loss, nose bleeds, sore throat Cvc:  No dizziness, chest pain or heaviness Resp:   Mild intermittent productive cough, mild shortness of breath with exertion Gi: Denies swallowing difficulty, stomach pain, nausea or vomiting, diarrhea, constipation, bowel incontinence Gu:  Denies bladder incontinence, burning  urine  Ext:   No Joint pain, stiffness or swelling Skin: No skin rash, easy bruising or bleeding or hives Endoc:  No polyuria, polydipsia , polyphagia or weight change Psych: No depression, insomnia or hallucinations  Other:  All other systems negative  Physical Examination:   VS: BP 138/80 mmHg  Pulse 89  Temp(Src) 97.9 F (36.6 C) (Oral)  Ht 5\' 7"  (1.702 m)  Wt 188 lb (85.276 kg)  BMI 29.44 kg/m2  SpO2 93%  General Appearance: No distress  Neuro:without focal findings, mental status, speech normal, alert and oriented, cranial nerves 2-12 intact, reflexes normal and symmetric, sensation grossly normal  HEENT: PERRLA, EOM intact, no ptosis, no other lesions noticed; Mallampati 2 Pulmonary: normal breath sounds., diaphragmatic excursion normal.No wheezing, No rales;   Sputum Production:  none CardiovascularNormal S1,S2.  No m/r/g.  Abdominal aorta pulsation normal.    Abdomen: Benign, Soft, non-tender, No masses, hepatosplenomegaly, No lymphadenopathy Renal:  No costovertebral tenderness  GU:  No performed at this time. Endoc: No evident thyromegaly, no signs of acromegaly or Cushing features Skin:   warm, no rashes, no ecchymosis  Extremities: normal, no cyanosis, clubbing, no edema, warm with normal capillary refill. Other findings:none   Review of Faxed Records by Dr. Stevenson Clinch CT Chest 04/02/15 No findings to suggest interstitial lung disease, 8 x 10 mm microlobulated and slightly spiculated nodule in the posterior aspect of the right upper lobe, concerning for potential primary bronchogenic neoplasm. Alternatively, given the patient's history of recent pneumonia, this could represent an area of post infectious or inflammatory scarring. There is an additional area of architectural distortion and slightly cephalad to this lesion within the right upper lobe which is also unusual, and favored to represent an area of scarring also. Mild diffuse bronchial wall thickening with mild  centrilobular emphysema Three-vessel coronary artery disease  Pulmonary function testing 06/06/2013 FVC 98% FEV110% FEV1/FVC 79% RV 110 TLC 97 DLCO 73   Assessment and Plan: 81 year old female seen in consultation for asthma optimization and history of pulmonary nodules COPD type A Symptoms are mild at this time mostly mild dyspnea on exertion. Continue with allergy control. Continue with Flovent 110, 2 puffs twice a day Patient advised avoid  any forms of tobacco including secondhand smoke, electronic cigarettes, and vapors.   Pulmonary nodules Records obtained from previous pulmonologist Dr.Sever C Surdulescu [facility], [facility] [phone]) Apparently patient had a bronchitis/pneumonia earlier in the year, discussed follow-up with a PET CT, PET CT showed possible new lesions or new pulmonary nodules (did not get a copy of this PET/CT), a repeat CT was done followed up in July 2016 that showed 8 x 65mm enlarging right upper lobe nodule along with a 1.4 x 1.7 cm right upper lobe nodule, also. Given that she had a recent infection, these were felt to be reactive or inflammatory but malignancy cannot be completely excluded.  Plan: Repeat CT with contrast prior to follow-up    Updated Medication List Outpatient Encounter Prescriptions as of 04/19/2015  Medication Sig  . acetaminophen (TYLENOL) 500 MG tablet Take 500 mg by mouth as needed.  Marland Kitchen albuterol (PROVENTIL HFA;VENTOLIN HFA) 108 (90 BASE) MCG/ACT inhaler Inhale 2 puffs into the lungs every 4 (four) hours as needed for wheezing or shortness of breath.  . Alum Hydroxide-Mag Carbonate (GAVISCON EXTRA STRENGTH) 160-105 MG CHEW Chew 2 tablets by mouth as needed.  Marland Kitchen amoxicillin (AMOXIL) 500 MG capsule TAKE 4 CAPSULES 30 MIN PRIOR TO DENTAL APPT  . aspirin 81 MG tablet Take  81 mg by mouth daily.  . celecoxib (CELEBREX) 200 MG capsule Take 1 capsule (200 mg total) by mouth daily.  . clobetasol cream (TEMOVATE) 4.91 % Apply 1  application topically as needed.   . docusate sodium (COLACE) 100 MG capsule Take 100 mg by mouth 2 (two) times daily as needed for mild constipation.  . Fish Oil-Cholecalciferol (OMEGA-3 FISH OIL-VITAMIN D3) 1200-1000 MG-UNIT CAPS Take 1 capsule by mouth daily.  . fluticasone (FLONASE) 50 MCG/ACT nasal spray Place 2 sprays into both nostrils daily.  . fluticasone (FLOVENT HFA) 110 MCG/ACT inhaler Inhale 2 puffs into the lungs 2 (two) times daily.  Donnie Aho (GLUCOSAMINE MSM COMPLEX) TABS Take 1 tablet by mouth 2 (two) times daily.  Marland Kitchen levocetirizine (XYZAL) 5 MG tablet Take 1 tablet (5 mg total) by mouth every evening.  . Melatonin 5 MG CAPS Take 1 capsule by mouth at bedtime.  . Multiple Vitamins-Minerals (ONE DAILY MULTIVITAMIN WOMEN PO) Take 1 tablet by mouth daily.  . Olopatadine HCl (PATADAY) 0.2 % SOLN Apply 1 drop to eye daily.  . Polyethylene Glycol 3350 GRAN 17 g by Does not apply route as needed.  . simvastatin (ZOCOR) 10 MG tablet Take 1 tablet (10 mg total) by mouth daily.  . Wheat Dextrin (BENEFIBER) POWD Take 4 g by mouth as needed.   No facility-administered encounter medications on file as of 04/19/2015.    Orders for this visit: No orders of the defined types were placed in this encounter.     Thank  you for the consultation and for allowing [facility] to assist in the care of your patient. Our recommendations are noted above.  Please contact us if we can be of further service.   Vilinda Boehringer, MD [facility] Office Number: [phone]

## 2015-04-20 DIAGNOSIS — M5417 Radiculopathy, lumbosacral region: Secondary | ICD-10-CM | POA: Diagnosis not present

## 2015-04-20 DIAGNOSIS — M545 Low back pain: Secondary | ICD-10-CM | POA: Diagnosis not present

## 2015-04-22 DIAGNOSIS — M5417 Radiculopathy, lumbosacral region: Secondary | ICD-10-CM | POA: Diagnosis not present

## 2015-04-22 DIAGNOSIS — M545 Low back pain: Secondary | ICD-10-CM | POA: Diagnosis not present

## 2015-04-26 DIAGNOSIS — M545 Low back pain: Secondary | ICD-10-CM | POA: Diagnosis not present

## 2015-04-26 DIAGNOSIS — M5417 Radiculopathy, lumbosacral region: Secondary | ICD-10-CM | POA: Diagnosis not present

## 2015-04-30 DIAGNOSIS — M5417 Radiculopathy, lumbosacral region: Secondary | ICD-10-CM | POA: Diagnosis not present

## 2015-04-30 DIAGNOSIS — M545 Low back pain: Secondary | ICD-10-CM | POA: Diagnosis not present

## 2015-05-03 DIAGNOSIS — M545 Low back pain: Secondary | ICD-10-CM | POA: Diagnosis not present

## 2015-05-03 DIAGNOSIS — M5416 Radiculopathy, lumbar region: Secondary | ICD-10-CM | POA: Diagnosis not present

## 2015-05-05 DIAGNOSIS — M5417 Radiculopathy, lumbosacral region: Secondary | ICD-10-CM | POA: Diagnosis not present

## 2015-05-05 DIAGNOSIS — M545 Low back pain: Secondary | ICD-10-CM | POA: Diagnosis not present

## 2015-05-10 DIAGNOSIS — M5417 Radiculopathy, lumbosacral region: Secondary | ICD-10-CM | POA: Diagnosis not present

## 2015-05-10 DIAGNOSIS — M545 Low back pain: Secondary | ICD-10-CM | POA: Diagnosis not present

## 2015-05-12 DIAGNOSIS — M5417 Radiculopathy, lumbosacral region: Secondary | ICD-10-CM | POA: Diagnosis not present

## 2015-05-12 DIAGNOSIS — M545 Low back pain: Secondary | ICD-10-CM | POA: Diagnosis not present

## 2015-05-13 DIAGNOSIS — M4806 Spinal stenosis, lumbar region: Secondary | ICD-10-CM | POA: Diagnosis not present

## 2015-05-13 DIAGNOSIS — M545 Low back pain: Secondary | ICD-10-CM | POA: Diagnosis not present

## 2015-05-13 DIAGNOSIS — M5416 Radiculopathy, lumbar region: Secondary | ICD-10-CM | POA: Diagnosis not present

## 2015-05-25 DIAGNOSIS — M461 Sacroiliitis, not elsewhere classified: Secondary | ICD-10-CM | POA: Diagnosis not present

## 2015-06-10 DIAGNOSIS — M5416 Radiculopathy, lumbar region: Secondary | ICD-10-CM | POA: Diagnosis not present

## 2015-06-10 DIAGNOSIS — M4806 Spinal stenosis, lumbar region: Secondary | ICD-10-CM | POA: Diagnosis not present

## 2015-06-16 DIAGNOSIS — M5417 Radiculopathy, lumbosacral region: Secondary | ICD-10-CM | POA: Diagnosis not present

## 2015-06-16 DIAGNOSIS — M545 Low back pain: Secondary | ICD-10-CM | POA: Diagnosis not present

## 2015-06-22 DIAGNOSIS — M545 Low back pain: Secondary | ICD-10-CM | POA: Diagnosis not present

## 2015-06-22 DIAGNOSIS — M5417 Radiculopathy, lumbosacral region: Secondary | ICD-10-CM | POA: Diagnosis not present

## 2015-06-29 DIAGNOSIS — M545 Low back pain: Secondary | ICD-10-CM | POA: Diagnosis not present

## 2015-06-29 DIAGNOSIS — M5417 Radiculopathy, lumbosacral region: Secondary | ICD-10-CM | POA: Diagnosis not present

## 2015-07-09 ENCOUNTER — Telehealth: Payer: Self-pay | Admitting: Internal Medicine

## 2015-07-09 NOTE — Telephone Encounter (Signed)
LM for pt to returncall

## 2015-07-12 NOTE — Telephone Encounter (Signed)
Pt called back asking about coming in to see you. You had nothing available. Since she was going to be gone for 2 months she had me to schedule her for the end of January 2017. This is an Pharmacist, hospital for you.

## 2015-07-26 ENCOUNTER — Encounter: Payer: Self-pay | Admitting: Internal Medicine

## 2015-07-26 ENCOUNTER — Ambulatory Visit (INDEPENDENT_AMBULATORY_CARE_PROVIDER_SITE_OTHER): Payer: Medicare Other | Admitting: Internal Medicine

## 2015-07-26 ENCOUNTER — Ambulatory Visit (INDEPENDENT_AMBULATORY_CARE_PROVIDER_SITE_OTHER)
Admission: RE | Admit: 2015-07-26 | Discharge: 2015-07-26 | Disposition: A | Discharge status: Home / Self Care | Payer: Medicare Other | Source: Ambulatory Visit | Attending: Internal Medicine | Admitting: Internal Medicine

## 2015-07-26 VITALS — BP 138/80 | HR 98 | Wt 192.0 lb

## 2015-07-26 DIAGNOSIS — M79672 Pain in left foot: Secondary | ICD-10-CM

## 2015-07-26 NOTE — Progress Notes (Signed)
Subjective:    Patient ID: Julie Davies, female    DOB: 1926/11/01, 81 y.o.   MRN: [id number]  HPI Here due to left foot pain  She "tweaked" her left ankle in grocery store 2 weeks ago or so Thinks she inverted-- and had pain in instep and along big toe Had been walking fairly normally Mild pain but still doing her regular exercise (Tai Chi and "walking with ease" and other things) Had tried some tylenol--some help  Yesterday, cat was right in front of her and she slammed her foot down hard trying to avoid him Pain was bad right away---foot and came up leg "I saw stars"  She tried some ice then No other meds Still able to walk on it  Current Outpatient Prescriptions on File Prior to Visit  Medication Sig Dispense Refill  . acetaminophen (TYLENOL) 500 MG tablet Take 500 mg by mouth as needed.    Marland Kitchen albuterol (PROVENTIL HFA;VENTOLIN HFA) 108 (90 BASE) MCG/ACT inhaler Inhale 2 puffs into the lungs every 4 (four) hours as needed for wheezing or shortness of breath. 18 g 3  . Alum Hydroxide-Mag Carbonate (GAVISCON EXTRA STRENGTH) 160-105 MG CHEW Chew 2 tablets by mouth as needed.    Marland Kitchen amoxicillin (AMOXIL) 500 MG capsule TAKE 4 CAPSULES 30 MIN PRIOR TO DENTAL APPT 4 capsule 0  . aspirin 81 MG tablet Take 81 mg by mouth daily.    . celecoxib (CELEBREX) 200 MG capsule Take 1 capsule (200 mg total) by mouth daily. 90 capsule 1  . clobetasol cream (TEMOVATE) 6.73 % Apply 1 application topically as needed.     . docusate sodium (COLACE) 100 MG capsule Take 100 mg by mouth 2 (two) times daily as needed for mild constipation.    . Fish Oil-Cholecalciferol (OMEGA-3 FISH OIL-VITAMIN D3) 1200-1000 MG-UNIT CAPS Take 1 capsule by mouth daily.    . fluticasone (FLONASE) 50 MCG/ACT nasal spray Place 2 sprays into both nostrils daily. 48 g 3  . fluticasone (FLOVENT HFA) 110 MCG/ACT inhaler Inhale 2 puffs into the lungs 2 (two) times daily.    Donnie Aho (GLUCOSAMINE MSM  COMPLEX) TABS Take 1 tablet by mouth 2 (two) times daily.    Marland Kitchen levocetirizine (XYZAL) 5 MG tablet Take 1 tablet (5 mg total) by mouth every evening. 30 tablet 0  . Multiple Vitamins-Minerals (ONE DAILY MULTIVITAMIN WOMEN PO) Take 1 tablet by mouth daily.    . Olopatadine HCl (PATADAY) 0.2 % SOLN Apply 1 drop to eye daily.    . Polyethylene Glycol 3350 GRAN 17 g by Does not apply route as needed.    . simvastatin (ZOCOR) 10 MG tablet Take 1 tablet (10 mg total) by mouth daily. 90 tablet 1  . Wheat Dextrin (BENEFIBER) POWD Take 4 g by mouth as needed.     No current facility-administered medications on file prior to visit.    No Known Allergies  Past Medical History  Diagnosis Date  . Asthma   . Arthritis   . Chicken pox   . GERD (gastroesophageal reflux disease)   . Allergy   . Hypertension   . History of kidney stones   . Pulmonary embolism on left [facility])     Past Surgical History  Procedure Laterality Date  . Appendectomy  1935  . Vein ligation and stripping Left 1971  . Nephrectomy Right 1975  . Replacement total knee bilateral  2013  . Dilation and curettage of uterus  1975    Family History  Problem Relation Age of Onset  . Heart disease Father   . Heart disease Maternal Aunt   . Stroke Maternal Uncle   . Cancer Neg Hx   . Diabetes Neg Hx     Social History   Social History  . Marital Status: Widowed    Spouse Name: N/A  . Number of Children: N/A  . Years of Education: N/A   Occupational History  . Not on file.   Social History Main Topics  . Smoking status: Never Smoker   . Smokeless tobacco: Never Used  . Alcohol Use: 0.0 oz/week    0 Standard drinks or equivalent per week     Comment: rare--wine  . Drug Use: No  . Sexual Activity: Not Currently   Other Topics Concern  . Not on file   Social History Narrative   Review of Systems  Generally wears orthotics in her [employer] shoes No gout in past No swelling     Objective:   Physical Exam   Musculoskeletal:  No foot swelling Ankle completely quiet Pain along instep and at base of 1st metatarsal Toes not painful          Assessment & Plan:

## 2015-07-26 NOTE — Assessment & Plan Note (Addendum)
At first an apparent twisting injury of forefoot---then more severe slam down yesterday Localized tenderness at base of 1st metatarsal X-ray reassuring Discussed rest, ice after on it for a while Try elastic wrap or continue the ACE wrap Tylenol Uses orthotics

## 2015-07-26 NOTE — Progress Notes (Signed)
Pre visit review using our clinic review tool, if applicable. No additional management support is needed unless otherwise documented below in the visit note. 

## 2015-07-27 ENCOUNTER — Other Ambulatory Visit: Payer: Self-pay | Admitting: Internal Medicine

## 2015-08-02 ENCOUNTER — Ambulatory Visit
Admission: RE | Admit: 2015-08-02 | Discharge: 2015-08-02 | Disposition: A | Discharge status: Home / Self Care | Payer: Medicare Other | Source: Ambulatory Visit | Attending: Internal Medicine | Admitting: Internal Medicine

## 2015-08-02 ENCOUNTER — Telehealth: Payer: Self-pay | Admitting: Internal Medicine

## 2015-08-02 DIAGNOSIS — J439 Emphysema, unspecified: Secondary | ICD-10-CM | POA: Diagnosis not present

## 2015-08-02 DIAGNOSIS — K449 Diaphragmatic hernia without obstruction or gangrene: Secondary | ICD-10-CM | POA: Diagnosis not present

## 2015-08-02 DIAGNOSIS — R918 Other nonspecific abnormal finding of lung field: Secondary | ICD-10-CM

## 2015-08-02 DIAGNOSIS — I2510 Atherosclerotic heart disease of native coronary artery without angina pectoris: Secondary | ICD-10-CM | POA: Insufficient documentation

## 2015-08-02 MED ORDER — IOHEXOL 300 MG/ML  SOLN
75.0000 mL | Freq: Once | INTRAMUSCULAR | Status: AC | PRN
  Administered 2015-08-02: 75 mL via INTRAVENOUS

## 2015-08-02 NOTE — Telephone Encounter (Addendum)
LMTCB for pt. --------------------- Per Dr. Stevenson Clinch:   Please inform patient of her CT Chest results.  - Still with small pulmonary nodules, similar to  description on previous outside chest CT report dated 05/28/2013.  - no change in size, no masses or tumors.    Also,  Please send a copy of the CT report to her PMD.    Thank you  Dr. Stevenson Clinch

## 2015-08-03 NOTE — Telephone Encounter (Signed)
Pt informed. Nothing further needed. 

## 2015-08-06 ENCOUNTER — Ambulatory Visit: Payer: Medicare Other | Admitting: Internal Medicine

## 2015-08-07 DIAGNOSIS — Z23 Encounter for immunization: Secondary | ICD-10-CM | POA: Diagnosis not present

## 2015-08-10 ENCOUNTER — Ambulatory Visit: Payer: Medicare Other

## 2015-08-23 ENCOUNTER — Other Ambulatory Visit: Payer: Self-pay | Admitting: Internal Medicine

## 2015-09-06 ENCOUNTER — Other Ambulatory Visit: Payer: Self-pay

## 2015-09-06 MED ORDER — FLUTICASONE PROPIONATE HFA 110 MCG/ACT IN AERO: 2.0000 | INHALATION_SPRAY | Freq: Two times a day (BID) | RESPIRATORY_TRACT | Status: DC

## 2015-09-10 ENCOUNTER — Ambulatory Visit: Payer: Medicare Other | Admitting: Internal Medicine

## 2015-09-22 DIAGNOSIS — K219 Gastro-esophageal reflux disease without esophagitis: Secondary | ICD-10-CM | POA: Diagnosis not present

## 2015-09-22 DIAGNOSIS — R911 Solitary pulmonary nodule: Secondary | ICD-10-CM | POA: Diagnosis not present

## 2015-09-22 DIAGNOSIS — K567 Ileus, unspecified: Secondary | ICD-10-CM | POA: Diagnosis not present

## 2015-09-22 DIAGNOSIS — R079 Chest pain, unspecified: Secondary | ICD-10-CM | POA: Diagnosis not present

## 2015-09-22 DIAGNOSIS — K859 Acute pancreatitis without necrosis or infection, unspecified: Secondary | ICD-10-CM | POA: Insufficient documentation

## 2015-09-22 DIAGNOSIS — R1013 Epigastric pain: Secondary | ICD-10-CM | POA: Diagnosis not present

## 2015-09-22 DIAGNOSIS — K8000 Calculus of gallbladder with acute cholecystitis without obstruction: Secondary | ICD-10-CM | POA: Diagnosis not present

## 2015-09-22 DIAGNOSIS — E041 Nontoxic single thyroid nodule: Secondary | ICD-10-CM | POA: Diagnosis not present

## 2015-09-22 DIAGNOSIS — K6389 Other specified diseases of intestine: Secondary | ICD-10-CM | POA: Diagnosis not present

## 2015-09-22 DIAGNOSIS — R109 Unspecified abdominal pain: Secondary | ICD-10-CM | POA: Diagnosis not present

## 2015-09-22 DIAGNOSIS — K851 Biliary acute pancreatitis without necrosis or infection: Secondary | ICD-10-CM | POA: Diagnosis not present

## 2015-09-22 DIAGNOSIS — I10 Essential (primary) hypertension: Secondary | ICD-10-CM | POA: Diagnosis not present

## 2015-09-23 DIAGNOSIS — K660 Peritoneal adhesions (postprocedural) (postinfection): Secondary | ICD-10-CM | POA: Diagnosis not present

## 2015-09-23 DIAGNOSIS — K449 Diaphragmatic hernia without obstruction or gangrene: Secondary | ICD-10-CM | POA: Diagnosis present

## 2015-09-23 DIAGNOSIS — R05 Cough: Secondary | ICD-10-CM | POA: Diagnosis present

## 2015-09-23 DIAGNOSIS — E041 Nontoxic single thyroid nodule: Secondary | ICD-10-CM | POA: Diagnosis present

## 2015-09-23 DIAGNOSIS — K8501 Idiopathic acute pancreatitis with uninfected necrosis: Secondary | ICD-10-CM | POA: Diagnosis not present

## 2015-09-23 DIAGNOSIS — K8020 Calculus of gallbladder without cholecystitis without obstruction: Secondary | ICD-10-CM | POA: Diagnosis not present

## 2015-09-23 DIAGNOSIS — Z96659 Presence of unspecified artificial knee joint: Secondary | ICD-10-CM | POA: Diagnosis present

## 2015-09-23 DIAGNOSIS — K859 Acute pancreatitis without necrosis or infection, unspecified: Secondary | ICD-10-CM | POA: Diagnosis not present

## 2015-09-23 DIAGNOSIS — I10 Essential (primary) hypertension: Secondary | ICD-10-CM | POA: Diagnosis present

## 2015-09-23 DIAGNOSIS — Z79899 Other long term (current) drug therapy: Secondary | ICD-10-CM | POA: Diagnosis not present

## 2015-09-23 DIAGNOSIS — K8000 Calculus of gallbladder with acute cholecystitis without obstruction: Secondary | ICD-10-CM | POA: Diagnosis not present

## 2015-09-23 DIAGNOSIS — K219 Gastro-esophageal reflux disease without esophagitis: Secondary | ICD-10-CM | POA: Diagnosis present

## 2015-09-23 DIAGNOSIS — K567 Ileus, unspecified: Secondary | ICD-10-CM | POA: Diagnosis present

## 2015-09-23 DIAGNOSIS — Z87891 Personal history of nicotine dependence: Secondary | ICD-10-CM | POA: Diagnosis not present

## 2015-09-23 DIAGNOSIS — Z905 Acquired absence of kidney: Secondary | ICD-10-CM | POA: Diagnosis not present

## 2015-09-23 DIAGNOSIS — K851 Biliary acute pancreatitis without necrosis or infection: Secondary | ICD-10-CM | POA: Diagnosis not present

## 2015-09-23 DIAGNOSIS — K811 Chronic cholecystitis: Secondary | ICD-10-CM | POA: Diagnosis not present

## 2015-09-23 DIAGNOSIS — Z86711 Personal history of pulmonary embolism: Secondary | ICD-10-CM | POA: Diagnosis not present

## 2015-09-23 DIAGNOSIS — J45909 Unspecified asthma, uncomplicated: Secondary | ICD-10-CM | POA: Diagnosis present

## 2015-09-23 DIAGNOSIS — R1013 Epigastric pain: Secondary | ICD-10-CM | POA: Diagnosis not present

## 2015-09-23 DIAGNOSIS — R911 Solitary pulmonary nodule: Secondary | ICD-10-CM | POA: Diagnosis present

## 2015-10-12 ENCOUNTER — Ambulatory Visit (INDEPENDENT_AMBULATORY_CARE_PROVIDER_SITE_OTHER): Payer: Medicare Other | Admitting: Internal Medicine

## 2015-10-12 ENCOUNTER — Encounter: Payer: Self-pay | Admitting: Internal Medicine

## 2015-10-12 VITALS — BP 126/78 | HR 70 | Temp 97.9°F | Wt 188.0 lb

## 2015-10-12 DIAGNOSIS — Z9049 Acquired absence of other specified parts of digestive tract: Secondary | ICD-10-CM

## 2015-10-12 DIAGNOSIS — E785 Hyperlipidemia, unspecified: Secondary | ICD-10-CM | POA: Diagnosis not present

## 2015-10-12 DIAGNOSIS — Z13820 Encounter for screening for osteoporosis: Secondary | ICD-10-CM

## 2015-10-12 LAB — COMPREHENSIVE METABOLIC PANEL
ALBUMIN: 4 g/dL (ref 3.5–5.2)
ALK PHOS: 64 U/L (ref 39–117)
ALT: 15 U/L (ref 0–35)
AST: 16 U/L (ref 0–37)
BILIRUBIN TOTAL: 0.4 mg/dL (ref 0.2–1.2)
BUN: 17 mg/dL (ref 6–23)
CALCIUM: 9.2 mg/dL (ref 8.4–10.5)
CO2: 30 meq/L (ref 19–32)
CREATININE: 0.81 mg/dL (ref 0.40–1.20)
Chloride: 103 mEq/L (ref 96–112)
GFR: 71.2 mL/min (ref >60.00)
GLUCOSE: 95 mg/dL (ref 70–99)
Potassium: 4 mEq/L (ref 3.5–5.1)
Sodium: 140 mEq/L (ref 135–145)
Total Protein: 6.8 g/dL (ref 6.0–8.3)

## 2015-10-12 LAB — CBC
HEMATOCRIT: 36.8 % (ref 36.0–46.0)
HEMOGLOBIN: 12.1 g/dL (ref 12.0–15.0)
MCHC: 32.9 g/dL (ref 30.0–36.0)
MCV: 85.6 fl (ref 78.0–100.0)
PLATELETS: 271 10*3/uL (ref 150.0–400.0)
RBC: 4.3 Mil/uL (ref 3.87–5.11)
RDW: 13.8 % (ref 11.5–15.5)
WBC: 4.8 10*3/uL (ref 4.0–10.5)

## 2015-10-12 LAB — LIPID PANEL
CHOL/HDL RATIO: 3
Cholesterol: 165 mg/dL (ref 0–200)
HDL: 56.9 mg/dL (ref >39.00)
LDL Cholesterol: 89 mg/dL (ref 0–99)
NONHDL: 107.93
Triglycerides: 93 mg/dL (ref 0.0–149.0)
VLDL: 18.6 mg/dL (ref 0.0–40.0)

## 2015-10-12 NOTE — Progress Notes (Signed)
Subjective:    Patient ID: Julie Davies, female    DOB: 03/27/1927, 82 y.o.   MRN: [id number]  HPI  Pt presents to the clinic today for hospital follow up for cholecystectomy. She was in [place] when she started having sweats, abdominal pain and nausea. She went to the ER. They told her that she had a gall stone in her common bile duct. They performed a laprascopic cholecystectomy 09/24/15. Since her surgery, she reports she has felt a little fatigued. Her appetite is good. She has been a little constipated. She is urinating okay. She denies pain, fever, chills or redness/drainage from the lap sites.  She is due for her Medicare Wellness exam in 2 weeks. She would like to get her labs drawn today if she could.  Review of Systems      Past Medical History  Diagnosis Date  . Asthma   . Arthritis   . Chicken pox   . GERD (gastroesophageal reflux disease)   . Allergy   . History of kidney stones   . Pulmonary embolism on left [facility])     Current Outpatient Prescriptions  Medication Sig Dispense Refill  . acetaminophen (TYLENOL) 500 MG tablet Take 500 mg by mouth as needed.    Marland Kitchen albuterol (PROVENTIL HFA;VENTOLIN HFA) 108 (90 BASE) MCG/ACT inhaler Inhale 2 puffs into the lungs every 4 (four) hours as needed for wheezing or shortness of breath. 18 g 3  . Alum Hydroxide-Mag Carbonate (GAVISCON EXTRA STRENGTH) 160-105 MG CHEW Chew 2 tablets by mouth as needed.    Marland Kitchen amoxicillin (AMOXIL) 500 MG capsule TAKE 4 CAPSULES 30 MIN PRIOR TO DENTAL APPT 4 capsule 0  . aspirin 81 MG tablet Take 81 mg by mouth daily.    . celecoxib (CELEBREX) 200 MG capsule Take 1 capsule (200 mg total) by mouth daily. MUST SCHEDULE ANNUAL PHYSICAL 90 capsule 0  . clobetasol cream (TEMOVATE) [id number] % Apply 1 application topically as needed.     . docusate sodium (COLACE) 100 MG capsule Take 100 mg by mouth 2 (two) times daily as needed for mild constipation.    . Fish Oil-Cholecalciferol (OMEGA-3 FISH OIL-VITAMIN  D3) 1200-1000 MG-UNIT CAPS Take 1 capsule by mouth daily.    Marland Kitchen FLAXSEED, LINSEED, PO Take by mouth.    . fluticasone (FLONASE) 50 MCG/ACT nasal spray Place 2 sprays into both nostrils daily. 48 g 3  . fluticasone (FLOVENT HFA) 110 MCG/ACT inhaler Inhale 2 puffs into the lungs 2 (two) times daily. 3 Inhaler 0  . gabapentin (NEURONTIN) 300 MG capsule     . Glucos-MSM-C-Mn-Ginger-Willow (GLUCOSAMINE MSM COMPLEX) TABS Take 1 tablet by mouth 2 (two) times daily.    Marland Kitchen levocetirizine (XYZAL) 5 MG tablet Take 1 tablet (5 mg total) by mouth every evening. 30 tablet 0  . Misc Natural Products (BLACK CHERRY CONCENTRATE) LIQD Take by mouth.    . Multiple Vitamins-Minerals (ONE DAILY MULTIVITAMIN WOMEN PO) Take 1 tablet by mouth daily.    . Olopatadine HCl (PATADAY) 0.2 % SOLN Apply 1 drop to eye daily.    . Polyethylene Glycol 3350 GRAN 17 g by Does not apply route as needed.    . simvastatin (ZOCOR) 10 MG tablet TAKE 1 TABLET DAILY 90 tablet 3  . Wheat Dextrin (BENEFIBER) POWD Take 4 g by mouth as needed.     No current facility-administered medications for this visit.    No Known Allergies  Family History  Problem Relation Age of Onset  . Heart  disease Father   . Heart disease Maternal Aunt   . Stroke Maternal Uncle   . Cancer Neg Hx   . Diabetes Neg Hx     Social History   Social History  . Marital Status: Widowed    Spouse Name: N/A  . Number of Children: N/A  . Years of Education: N/A   Occupational History  . Not on file.   Social History Main Topics  . Smoking status: Never Smoker   . Smokeless tobacco: Never Used  . Alcohol Use: 0.0 oz/week    0 Standard drinks or equivalent per week     Comment: rare--wine  . Drug Use: No  . Sexual Activity: Not Currently   Other Topics Concern  . Not on file   Social History Narrative     Constitutional: Denies fever, malaise, fatigue, headache or abrupt weight changes.  Respiratory: Denies difficulty breathing, shortness of  breath, cough or sputum production.  Cardiovascular: Denies chest pain, chest tightness, palpitations or swelling in the hands or feet.  Gastrointestinal: Denies abdominal pain, bloating, constipation, diarrhea or blood in the stool. .  Skin: Pt reports lap sites on abdomen. Denies redness, rashes, lesions or ulcercations.    No other specific complaints in a complete review of systems (except as listed in HPI above).  Objective:   Physical Exam  BP 126/78 mmHg  Pulse 70  Temp(Src) 97.9 F (36.6 C) (Oral)  Wt 188 lb (85.276 kg)  SpO2 98% Wt Readings from Last 3 Encounters:  10/12/15 188 lb (85.276 kg)  07/26/15 192 lb (87.091 kg)  04/19/15 188 lb (85.276 kg)    General: Appears herstated age, well developed, well nourished in NAD. Skin: Warm, dry and intact. 4 laps sites, healed. Cardiovascular: Normal rate and rhythm. S1,S2 noted.  No murmur, rubs or gallops noted. Pulmonary/Chest: Normal effort and positive vesicular breath sounds. No respiratory distress. No wheezes, rales or ronchi noted.  Abdomen: Soft and nontender. Normal bowel sounds  BMET    Component Value Date/Time   NA 136 03/12/2015 1407   K 4.4 03/12/2015 1407   CL 102 03/12/2015 1407   CO2 30 03/12/2015 1407   GLUCOSE 98 03/12/2015 1407   BUN 14 03/12/2015 1407   CREATININE 0.78 03/12/2015 1407   CALCIUM 9.6 03/12/2015 1407    Lipid Panel     Component Value Date/Time   CHOL 163 03/12/2015 1407   TRIG 93.0 03/12/2015 1407   HDL 58.70 03/12/2015 1407   CHOLHDL 3 03/12/2015 1407   VLDL 18.6 03/12/2015 1407   LDLCALC 86 03/12/2015 1407    CBC    Component Value Date/Time   WBC 5.6 03/12/2015 1407   RBC 4.47 03/12/2015 1407   HGB 12.6 03/12/2015 1407   HCT 38.5 03/12/2015 1407   PLT 235.0 03/12/2015 1407   MCV 86.2 03/12/2015 1407   MCHC 32.8 03/12/2015 1407   RDW 13.8 03/12/2015 1407    Hgb A1C No results found for: HGBA1C       Assessment & Plan:   Cholecystectomy:  Discussed  consuming smaller, more frequent meals She is no longer taking Oxycodone Colace as needed for constipation Make sure your are drinking lots of water  Will check CBC, CMET, Lipid and Vit D for Medicare Wellness exam

## 2015-10-12 NOTE — Progress Notes (Signed)
Pre visit review using our clinic review tool, if applicable. No additional management support is needed unless otherwise documented below in the visit note. 

## 2015-10-12 NOTE — Patient Instructions (Signed)
Laparoscopic Cholecystectomy Laparoscopic cholecystectomy is surgery to remove the gallbladder. The gallbladder is located in the upper right part of the abdomen, behind the liver. It is a storage sac for bile, which is produced in the liver. Bile aids in the digestion and absorption of fats. Cholecystectomy is often done for inflammation of the gallbladder (cholecystitis). This condition is usually caused by a buildup of gallstones (cholelithiasis) in the gallbladder. Gallstones can block the flow of bile, and that can result in inflammation and pain. In severe cases, emergency surgery may be required. If emergency surgery is not required, you will have time to prepare for the procedure. Laparoscopic surgery is an alternative to open surgery. Laparoscopic surgery has a shorter recovery time. Your common bile duct may also need to be examined during the procedure. If stones are found in the common bile duct, they may be removed. LET YOUR HEALTH CARE PROVIDER KNOW ABOUT:  Any allergies you have.  All medicines you are taking, including vitamins, herbs, eye drops, creams, and over-the-counter medicines.  Previous problems you or members of your family have had with the use of anesthetics.  Any blood disorders you have.  Previous surgeries you have had.  Any medical conditions you have. RISKS AND COMPLICATIONS Generally, this is a safe procedure. However, problems may occur, including:  Infection.  Bleeding.  Allergic reactions to medicines.  Damage to other structures or organs.  A stone remaining in the common bile duct.  A bile leak from the cyst duct that is clipped when your gallbladder is removed.  The need to convert to open surgery, which requires a larger incision in the abdomen. This may be necessary if your surgeon thinks that it is not safe to continue with a laparoscopic procedure. BEFORE THE PROCEDURE  Ask your health care provider about:  Changing or stopping your  regular medicines. This is especially important if you are taking diabetes medicines or blood thinners.  Taking medicines such as aspirin and ibuprofen. These medicines can thin your blood. Do not take these medicines before your procedure if your health care provider instructs you not to.  Follow instructions from your health care provider about eating or drinking restrictions.  Let your health care provider know if you develop a cold or an infection before surgery.  Plan to have someone take you home after the procedure.  Ask your health care provider how your surgical site will be marked or identified.  You may be given antibiotic medicine to help prevent infection. PROCEDURE  To reduce your risk of infection:  Your health care team will wash or sanitize their hands.  Your skin will be washed with soap.  An IV tube may be inserted into one of your veins.  You will be given a medicine to make you fall asleep (general anesthetic).  A breathing tube will be placed in your mouth.  The surgeon will make several small cuts (incisions) in your abdomen.  A thin, lighted tube (laparoscope) that has a tiny camera on the end will be inserted through one of the small incisions. The camera on the laparoscope will send a picture to a TV screen (monitor) in the operating room. This will give the surgeon a good view inside your abdomen.  A gas will be pumped into your abdomen. This will expand your abdomen to give the surgeon more room to perform the surgery.  Other tools that are needed for the procedure will be inserted through the other incisions. The gallbladder will   be removed through one of the incisions.  After your gallbladder has been removed, the incisions will be closed with stitches (sutures), staples, or skin glue.  Your incisions may be covered with a bandage (dressing). The procedure may vary among health care providers and hospitals. AFTER THE PROCEDURE  Your blood  pressure, heart rate, breathing rate, and blood oxygen level will be monitored often until the medicines you were given have worn off.  You will be given medicines as needed to control your pain.   This information is not intended to replace advice given to you by your health care provider. Make sure you discuss any questions you have with your health care provider.   Document Released: 09/11/2005 Document Revised: 06/02/2015 Document Reviewed: 04/23/2013 Elsevier Interactive Patient Education 2016 [employer]  

## 2015-10-18 ENCOUNTER — Ambulatory Visit (INDEPENDENT_AMBULATORY_CARE_PROVIDER_SITE_OTHER): Payer: Medicare Other | Admitting: *Deleted

## 2015-10-18 ENCOUNTER — Other Ambulatory Visit: Payer: Self-pay | Admitting: Internal Medicine

## 2015-10-18 DIAGNOSIS — R918 Other nonspecific abnormal finding of lung field: Secondary | ICD-10-CM

## 2015-10-18 DIAGNOSIS — J439 Emphysema, unspecified: Secondary | ICD-10-CM

## 2015-10-18 LAB — PULMONARY FUNCTION TEST
DL/VA % pred: 97 %
DL/VA: 5.05 ml/min/mmHg/L
DLCO UNC % PRED: 75 %
DLCO unc: 21.84 ml/min/mmHg
FEF 25-75 Post: 1.58 L/sec
FEF 25-75 Pre: 1.24 L/sec
FEF2575-%CHANGE-POST: 27 %
FEF2575-%PRED-POST: 123 %
FEF2575-%Pred-Pre: 96 %
FEV1-%CHANGE-POST: 4 %
FEV1-%PRED-POST: 84 %
FEV1-%PRED-PRE: 80 %
FEV1-POST: 1.73 L
FEV1-Pre: 1.66 L
FEV1FVC-%CHANGE-POST: 12 %
FEV1FVC-%PRED-PRE: 104 %
FEV6-%Change-Post: -7 %
FEV6-%Pred-Post: 78 %
FEV6-%Pred-Pre: 84 %
FEV6-Post: 2.04 L
FEV6-Pre: 2.2 L
FEV6FVC-%Pred-Post: 105 %
FEV6FVC-%Pred-Pre: 105 %
FVC-%Change-Post: -6 %
FVC-%PRED-PRE: 79 %
FVC-%Pred-Post: 74 %
FVC-POST: 2.06 L
FVC-PRE: 2.2 L
POST FEV1/FVC RATIO: 84 %
PRE FEV6/FVC RATIO: 100 %
Post FEV6/FVC ratio: 100 %
Pre FEV1/FVC ratio: 75 %

## 2015-10-18 NOTE — Progress Notes (Signed)
PFT performed today with a Nitrogen washout.

## 2015-10-18 NOTE — Progress Notes (Signed)
SMW performed today. 

## 2015-10-25 ENCOUNTER — Encounter: Payer: Self-pay | Admitting: Internal Medicine

## 2015-10-25 ENCOUNTER — Ambulatory Visit (INDEPENDENT_AMBULATORY_CARE_PROVIDER_SITE_OTHER): Payer: Medicare Other | Admitting: Internal Medicine

## 2015-10-25 VITALS — BP 126/62 | HR 77 | Ht 67.5 in | Wt 189.9 lb

## 2015-10-25 DIAGNOSIS — R918 Other nonspecific abnormal finding of lung field: Secondary | ICD-10-CM

## 2015-10-25 DIAGNOSIS — J439 Emphysema, unspecified: Secondary | ICD-10-CM | POA: Diagnosis not present

## 2015-10-25 NOTE — Assessment & Plan Note (Signed)
Records obtained from previous pulmonologist Dr.Sever C Surdulescu [facility], Ph [phone]) Reviewed recent CT from 07/2015, still with some sub cm nodules, however, the dominant Right 58mm nodule has remained stable in size.  Plan: Repeat CT with contrast prior to follow-up in 4 months

## 2015-10-25 NOTE — Patient Instructions (Addendum)
Follow up with Dr. Stevenson Clinch in: 4 months - f/u CT Chest with contrast in 4 months for pulmonary nodules.  - cont with current inhalers.  - 2 view CXR in the next week due to cough and productive sputum

## 2015-10-25 NOTE — Progress Notes (Addendum)
Date: 10/25/2015  MRN# [id number] Julie Davies 07/07/1927  Referring Physician: NP Webb Silversmith  Julie Davies is a 82 y.o. old female seen in consultation for asthma and lung nodules  CC:  Chief Complaint  Patient presents with  . Follow-up    SMW & PFT results. pt. states breathing up & down. occ. SOB. prod. cough white in color. occ. chest/tightness. occ. wheezing.     HPI:  Patient is a pleasant 82 year old female seen in consultation today for evaluation of lung nodules and asthma optimization. Patient has a history of lung nodules. He follow-up pulmonologist in [place], review of records show that she was following his earliest 2014 for 2 small 8 mm lower lobe nodules with mild adenopathy. Nodules are nonspecific in a patient with a history of choking, hiatal hernia and possible aspiration. At that time surveillance was recommended. Patient stated that she had her last CT in March 2016 at all pulmonologist office. Patient is a prolonged history of asthma, smoked for about 10 years quit in the 1950s, had significant secondhand smoking exposure from appearance. For her asthma she is currently on as needed albuterol, and Flovent. Patient states she has a mild intermittent slightly productive cough at times, has not had to use albuterol rescue inhaler in a few months. However she did state that prior to walking she will take 2 puffs of albuterol. Patient is a former Nature conservation officer, had several Torres in [place] and station in various areas throughout the [place] including [place], [place], [place]. Patient states she has mild dizziness today but this is a chronic problem, she lives with her daughter, she can walk up and down stairs with mild dyspnea. Review of records showed she had one asthma exacerbation in 2015 that was treated with antibiotics and steroids. As for her pulmonary nodules based on her CAT scan from 2016 patient states she has 2 new nodules for a total of 4 nodules. At  today's visit there is no CD with CT scans to compare previous lung windows. Allergies: Symptoms controlled on Xyzal, Flonase and Pataday.  Events since last clinic vist:  She presents today for follow-up visit of her pulmonary nodules in her COPD.  since her last visit  She had an episode of acute cholecystitis, requiring urgent cholecystectomy while she was on vacation. This occurred in late December 2016. Today she endorses chronic cough, mild productive sputum , brownish to white in nature, no fever, no chills, no worsening shortness of breath.   I reviewed her CT scan from November 2016 with her, she has stable pulmonary nodules, dominant right sided 8 mm pulmonary nodule has not changed in size since her last 2 CTs.  Overall she is doing fairly well for her current condition.  PMHX:   Past Medical History  Diagnosis Date  . Asthma   . Arthritis   . Chicken pox   . GERD (gastroesophageal reflux disease)   . Allergy   . History of kidney stones   . Pulmonary embolism on left [facility])    Surgical Hx:  Past Surgical History  Procedure Laterality Date  . Appendectomy  1935  . Vein ligation and stripping Left 1971  . Nephrectomy Right 1975  . Replacement total knee bilateral  2013  . Dilation and curettage of uterus  1975   Family Hx:  Family History  Problem Relation Age of Onset  . Heart disease Father   . Heart disease Maternal Aunt   . Stroke Maternal Uncle   . Cancer  Neg Hx   . Diabetes Neg Hx    Social Hx:   Social History  Substance Use Topics  . Smoking status: Never Smoker   . Smokeless tobacco: Never Used  . Alcohol Use: 0.0 oz/week    0 Standard drinks or equivalent per week     Comment: rare--wine   Medication:   Current Outpatient Rx  Name  Route  Sig  Dispense  Refill  . acetaminophen (TYLENOL) 500 MG tablet   Oral   Take 500 mg by mouth as needed.         Marland Kitchen albuterol (PROVENTIL HFA;VENTOLIN HFA) 108 (90 BASE) MCG/ACT inhaler   Inhalation   Inhale  2 puffs into the lungs every 4 (four) hours as needed for wheezing or shortness of breath.   18 g   3   . Alum Hydroxide-Mag Carbonate (GAVISCON EXTRA STRENGTH) 160-105 MG CHEW   Oral   Chew 2 tablets by mouth as needed.         Marland Kitchen amoxicillin (AMOXIL) 500 MG capsule      TAKE 4 CAPSULES 30 MIN PRIOR TO DENTAL APPT   4 capsule   0   . aspirin 81 MG tablet   Oral   Take 81 mg by mouth daily.         . clobetasol cream (TEMOVATE) 0.05 %   Topical   Apply 1 application topically as needed.          . docusate sodium (COLACE) 100 MG capsule   Oral   Take 100 mg by mouth 2 (two) times daily as needed for mild constipation.         . Fish Oil-Cholecalciferol (OMEGA-3 FISH OIL-VITAMIN D3) 1200-1000 MG-UNIT CAPS   Oral   Take 1 capsule by mouth daily.         Marland Kitchen FLAXSEED, LINSEED, PO   Oral   Take by mouth.         . fluticasone (FLONASE) 50 MCG/ACT nasal spray   Each Nare   Place 2 sprays into both nostrils daily.   48 g   3   . fluticasone (FLOVENT HFA) 110 MCG/ACT inhaler   Inhalation   Inhale 2 puffs into the lungs 2 (two) times daily.   3 Inhaler   0   . gabapentin (NEURONTIN) 300 MG capsule               . Glucos-MSM-C-Mn-Ginger-Willow (GLUCOSAMINE MSM COMPLEX) TABS   Oral   Take 1 tablet by mouth 2 (two) times daily.         Marland Kitchen levocetirizine (XYZAL) 5 MG tablet   Oral   Take 1 tablet (5 mg total) by mouth every evening.   30 tablet   0   . Misc Natural Products (BLACK CHERRY CONCENTRATE) LIQD   Oral   Take by mouth.         . Multiple Vitamins-Minerals (ONE DAILY MULTIVITAMIN WOMEN PO)   Oral   Take 1 tablet by mouth daily.         . Olopatadine HCl (PATADAY) 0.2 % SOLN   Ophthalmic   Apply 1 drop to eye daily.         . Polyethylene Glycol 3350 GRAN   Does not apply   17 g by Does not apply route as needed.         . simvastatin (ZOCOR) 10 MG tablet      TAKE 1 TABLET DAILY   90 tablet  3   . Wheat Dextrin  (BENEFIBER) POWD   Oral   Take 4 g by mouth as needed.             Allergies:  Review of patient's allergies indicates no known allergies.  Review of Systems: Gen:  Denies  fever, sweats, chills HEENT: Denies blurred vision, double vision, ear pain, eye pain, hearing loss, nose bleeds, sore throat Cvc:  No dizziness, chest pain or heaviness Resp:   Mild intermittent productive cough, mild shortness of breath with exertion Gi: Denies swallowing difficulty, stomach pain, nausea or vomiting, diarrhea, constipation, bowel incontinence Gu:  Denies bladder incontinence, burning urine Ext:   No Joint pain, stiffness or swelling Skin: No skin rash, easy bruising or bleeding or hives Endoc:  No polyuria, polydipsia , polyphagia or weight change Psych: No depression, insomnia or hallucinations  Other:  All other systems negative  Physical Examination:   VS: BP 126/62 mmHg  Pulse 77  Ht 5' 7.5" (1.715 m)  Wt 189 lb 14.4 oz (86.138 kg)  BMI 29.29 kg/m2  SpO2 96%  General Appearance: No distress  Neuro:without focal findings, mental status, speech normal, alert and oriented, cranial nerves 2-12 intact, reflexes normal and symmetric, sensation grossly normal  HEENT: PERRLA, EOM intact, no ptosis, no other lesions noticed; Mallampati 2 Pulmonary: normal breath sounds., diaphragmatic excursion normal.No wheezing, No rales;   Sputum Production:  none CardiovascularNormal S1,S2.  No m/r/g.  Abdominal aorta pulsation normal.    Abdomen: Benign, Soft, non-tender, No masses, hepatosplenomegaly, No lymphadenopathy Renal:  No costovertebral tenderness  GU:  No performed at this time. Endoc: No evident thyromegaly, no signs of acromegaly or Cushing features Skin:   warm, no rashes, no ecchymosis  Extremities: normal, no cyanosis, clubbing, no edema, warm with normal capillary refill. Other findings:none   Review of Faxed Records by Dr. Stevenson Clinch CT Chest 04/02/15 No findings to suggest interstitial  lung disease, 8 x 10 mm microlobulated and slightly spiculated nodule in the posterior aspect of the right upper lobe, concerning for potential primary bronchogenic neoplasm. Alternatively, given the patient's history of recent pneumonia, this could represent an area of post infectious or inflammatory scarring. There is an additional area of architectural distortion and slightly cephalad to this lesion within the right upper lobe which is also unusual, and favored to represent an area of scarring also. Mild diffuse bronchial wall thickening with mild centrilobular emphysema Three-vessel coronary artery disease  Pulmonary function testing 06/06/2013 FVC 98% FEV110% FEV1/FVC 79% RV 110 TLC 97 DLCO 73  (The following images and results were reviewed by Dr. Stevenson Clinch on 10/25/2015). CT Chest 07/2015 EXAM: CT CHEST WITH CONTRAST  TECHNIQUE: Multidetector CT imaging of the chest was performed during intravenous contrast administration.  CONTRAST: 1mL OMNIPAQUE IOHEXOL 300 MG/ML SOLN  COMPARISON: Outside chest CT report dated 05/28/2013.  FINDINGS: Comparison is made to a chest CT report from a [facility] dated 05/28/2013. Images from that study are not available to me at this time.  There are scattered sub cm pulmonary nodules within each lung, right slightly greater than left, lower lobe predominant, similar to the description on outside chest CT report of 05/28/2013. 8 mm pulmonary nodules are identified within the right lower lobe and at the left lung base, similar to the description on the previous report.  No consolidations to suggest pneumonia. No pleural effusion. No pneumothorax. Trachea and central bronchi are unremarkable.  Scattered small lymph nodes noted within the mediastinum. No enlarged  lymph nodes within the mediastinum or perihilar regions. Multiple hypodense nodules/cysts noted within the bilateral thyroid lobes, largest within  the right thyroid lobe measuring 16 mm, with nodules also described in the thyroid lobe on the previous report.  Scattered atherosclerotic changes noted along the walls of the normal-caliber thoracic aorta. There is a hiatal hernia, small to moderate in size. Heart size is normal. No pericardial effusion. Coronary artery calcifications are noted.  Limited images of the upper abdomen are unremarkable. Scattered degenerative changes noted throughout the thoracic spine. No acute osseous abnormality.  IMPRESSION: 1. Scattered pulmonary nodules within each lung, lower lobe predominant, largest measuring 8 mm greatest dimension, similar to description on previous outside chest CT report dated 05/28/2013. I do not, however, have the actual images for direct comparison. Neoplastic nodules cannot be excluded. If the patient is at high risk for bronchogenic carcinoma, follow-up chest CT at 3-12months is recommended. If the patient is at low risk for bronchogenic carcinoma, follow-up chest CT at 6-12 months is recommended. This recommendation follows the consensus statement: Guidelines for Management of Small Pulmonary Nodules Detected on CT Scans: A Statement from the [place] as published in Radiology 2005; 237:395-400. 2. Heart size is normal. No pericardial effusion. 3. Coronary artery calcifications, particularly dense within the left anterior descending and left circumflex coronary arteries. Recommend correlation with any possible associated cardiac symptoms. 4. Hiatal hernia, small to moderate in size. 5. Multiple small nodules/cysts within the bilateral thyroid lobes, largest on the right measuring 16 mm. Recommend nonemergent follow-up with thyroid ultrasound and/or nuclear medicine thyroid scan if clinically needed and not already performed.  Pulmonary Function Test 10/18/15  FEV1 80% FEV1/FVC 75% FVC 79%  RV 71% TLC 65% RV/TLC 106% ERV 24% DLCO 75%.   Impression: no significant obstruction or response to bronchodilators. Mild to moderate restriction noted. Severe reduction in ERV ( usually seen in abdominal obesity ), clinical correlation advised.   6 minute walk test - heart heart rate 107, total distance 994 feet /303 m , no somatic and desaturations, patient use cane during test.  Assessment and Plan: 82 year old female seen in consultation for asthma optimization and history of pulmonary nodules COPD type A Continue with allergy control. Continue with Flovent 110, 2 puffs twice a day Patient advised avoid  any forms of tobacco including secondhand smoke, electronic cigarettes, and vapors. Having some mild increase in sputum production - check 2 view cxr.     Pulmonary nodules Records obtained from previous pulmonologist Dr.Sever C Surdulescu [facility], Ph [phone]) Reviewed recent CT from 07/2015, still with some sub cm nodules, however, the dominant Right 57mm nodule has remained stable in size.  Plan: Repeat CT with contrast prior to follow-up in 4 months       Updated Medication List Outpatient Encounter Prescriptions as of 10/25/2015  Medication Sig  . acetaminophen (TYLENOL) 500 MG tablet Take 500 mg by mouth as needed.  Marland Kitchen albuterol (PROVENTIL HFA;VENTOLIN HFA) 108 (90 BASE) MCG/ACT inhaler Inhale 2 puffs into the lungs every 4 (four) hours as needed for wheezing or shortness of breath.  . Alum Hydroxide-Mag Carbonate (GAVISCON EXTRA STRENGTH) 160-105 MG CHEW Chew 2 tablets by mouth as needed.  Marland Kitchen amoxicillin (AMOXIL) 500 MG capsule TAKE 4 CAPSULES 30 MIN PRIOR TO DENTAL APPT  . aspirin 81 MG tablet Take 81 mg by mouth daily.  . clobetasol cream (TEMOVATE) [id number] % Apply 1 application topically as needed.   . docusate sodium (COLACE) 100 MG capsule Take  100 mg by mouth 2 (two) times daily as needed for mild constipation.  . Fish Oil-Cholecalciferol (OMEGA-3 FISH OIL-VITAMIN D3) 1200-1000 MG-UNIT CAPS Take 1  capsule by mouth daily.  Marland Kitchen FLAXSEED, LINSEED, PO Take by mouth.  . fluticasone (FLONASE) 50 MCG/ACT nasal spray Place 2 sprays into both nostrils daily.  . fluticasone (FLOVENT HFA) 110 MCG/ACT inhaler Inhale 2 puffs into the lungs 2 (two) times daily.  Marland Kitchen gabapentin (NEURONTIN) 300 MG capsule   . Glucos-MSM-C-Mn-Ginger-Willow (GLUCOSAMINE MSM COMPLEX) TABS Take 1 tablet by mouth 2 (two) times daily.  Marland Kitchen levocetirizine (XYZAL) 5 MG tablet Take 1 tablet (5 mg total) by mouth every evening.  . Misc Natural Products (BLACK CHERRY CONCENTRATE) LIQD Take by mouth.  . Multiple Vitamins-Minerals (ONE DAILY MULTIVITAMIN WOMEN PO) Take 1 tablet by mouth daily.  . Olopatadine HCl (PATADAY) 0.2 % SOLN Apply 1 drop to eye daily.  . Polyethylene Glycol 3350 GRAN 17 g by Does not apply route as needed.  . simvastatin (ZOCOR) 10 MG tablet TAKE 1 TABLET DAILY  . Wheat Dextrin (BENEFIBER) POWD Take 4 g by mouth as needed.   No facility-administered encounter medications on file as of 10/25/2015.    Orders for this visit: Orders Placed This Encounter  Procedures  . CT Chest W Contrast    Standing Status: Future     Number of Occurrences:      Standing Expiration Date: 12/22/2016    Order Specific Question:  If indicated for the ordered procedure, I authorize the administration of contrast media per Radiology protocol    Answer:  Yes    Order Specific Question:  Reason for Exam (SYMPTOM  OR DIAGNOSIS REQUIRED)    Answer:  lung nodules    Order Specific Question:  Preferred imaging location?    Answer:  [facility]  . DG Chest 2 View    Standing Status: Future     Number of Occurrences:      Standing Expiration Date: 12/24/2016    Order Specific Question:  Reason for Exam (SYMPTOM  OR DIAGNOSIS REQUIRED)    Answer:  chonic cough    Order Specific Question:  Preferred imaging location?    Answer:  [facility] panel    Standing Status: Future     Number of Occurrences:       Standing Expiration Date: 10/24/2016     Thank  you for the consultation and for allowing [facility] to assist in the care of your patient. Our recommendations are noted above.  Please contact us if we can be of further service.   Vilinda Boehringer, MD [facility] Office Number: [phone]

## 2015-10-25 NOTE — Assessment & Plan Note (Addendum)
Continue with allergy control. Continue with Flovent 110, 2 puffs twice a day Patient advised avoid  any forms of tobacco including secondhand smoke, electronic cigarettes, and vapors. Having some mild increase in sputum production - check 2 view cxr.

## 2015-10-26 ENCOUNTER — Ambulatory Visit
Admission: RE | Admit: 2015-10-26 | Discharge: 2015-10-26 | Disposition: A | Discharge status: Home / Self Care | Payer: Medicare Other | Source: Ambulatory Visit | Attending: Internal Medicine | Admitting: Internal Medicine

## 2015-10-26 DIAGNOSIS — Z87891 Personal history of nicotine dependence: Secondary | ICD-10-CM | POA: Diagnosis not present

## 2015-10-26 DIAGNOSIS — R918 Other nonspecific abnormal finding of lung field: Secondary | ICD-10-CM | POA: Diagnosis not present

## 2015-10-26 DIAGNOSIS — J439 Emphysema, unspecified: Secondary | ICD-10-CM

## 2015-10-26 DIAGNOSIS — R05 Cough: Secondary | ICD-10-CM | POA: Insufficient documentation

## 2015-10-26 DIAGNOSIS — K449 Diaphragmatic hernia without obstruction or gangrene: Secondary | ICD-10-CM | POA: Diagnosis not present

## 2015-10-28 DIAGNOSIS — M5416 Radiculopathy, lumbar region: Secondary | ICD-10-CM | POA: Diagnosis not present

## 2015-10-28 DIAGNOSIS — M4806 Spinal stenosis, lumbar region: Secondary | ICD-10-CM | POA: Diagnosis not present

## 2015-11-01 ENCOUNTER — Ambulatory Visit: Payer: Medicare Other | Admitting: Internal Medicine

## 2015-11-10 DIAGNOSIS — M47816 Spondylosis without myelopathy or radiculopathy, lumbar region: Secondary | ICD-10-CM | POA: Diagnosis not present

## 2015-11-10 DIAGNOSIS — M533 Sacrococcygeal disorders, not elsewhere classified: Secondary | ICD-10-CM | POA: Diagnosis not present

## 2015-11-11 ENCOUNTER — Other Ambulatory Visit: Payer: Self-pay

## 2015-11-11 NOTE — Telephone Encounter (Signed)
Never filled by you--please advise if okay to send in to mail order

## 2015-11-12 MED ORDER — OLOPATADINE HCL 0.2 % OP SOLN: 1.0000 [drp] | Freq: Every day | OPHTHALMIC | Status: DC

## 2015-11-12 NOTE — Telephone Encounter (Signed)
Ok to send to mail order?  

## 2015-11-26 ENCOUNTER — Other Ambulatory Visit: Payer: Self-pay

## 2015-11-26 MED ORDER — OLOPATADINE HCL 0.2 % OP SOLN: 1.0000 [drp] | Freq: Every day | OPHTHALMIC | Status: DC

## 2015-12-04 ENCOUNTER — Other Ambulatory Visit: Payer: Self-pay | Admitting: Internal Medicine

## 2015-12-10 ENCOUNTER — Ambulatory Visit (INDEPENDENT_AMBULATORY_CARE_PROVIDER_SITE_OTHER): Payer: Medicare Other

## 2015-12-10 VITALS — BP 122/78 | HR 76 | Temp 97.7°F | Ht 68.0 in | Wt 188.2 lb

## 2015-12-10 DIAGNOSIS — Z0000 Encounter for general adult medical examination without abnormal findings: Secondary | ICD-10-CM

## 2015-12-10 DIAGNOSIS — Z23 Encounter for immunization: Secondary | ICD-10-CM

## 2015-12-10 NOTE — Patient Instructions (Signed)
Julie Davies , Thank you for taking time to come for your Medicare Wellness Visit. I appreciate your ongoing commitment to your health goals. Please review the following plan we discussed and let me know if I can assist you in the future.   These are the goals we discussed: Goals    . Maintain physical activity     Starting 12/10/2015, I will continue to do [facility] at least 2 times per week.        This is a list of the screening recommended for you and due dates:  Health Maintenance  Topic Date Due  . DEXA scan (bone density measurement)  06/25/2016*  . Flu Shot  04/25/2016  . Tetanus Vaccine  10/27/2019  . Shingles Vaccine  Completed  . Pneumonia vaccines  Completed  *Topic was postponed. The date shown is not the original due date.   Preventive Care for Adults  A healthy lifestyle and preventive care can promote health and wellness. Preventive health guidelines for adults include the following key practices.  . A routine yearly physical is a good way to check with your health care provider about your health and preventive screening. It is a chance to share any concerns and updates on your health and to receive a thorough exam.  . Visit your dentist for a routine exam and preventive care every 6 months. Brush your teeth twice a day and floss once a day. Good oral hygiene prevents tooth decay and gum disease.  . The frequency of eye exams is based on your age, health, family medical history, use  of contact lenses, and other factors. Follow your health care provider's ecommendations for frequency of eye exams.  . Eat a healthy diet. Foods like vegetables, fruits, whole grains, low-fat dairy products, and lean protein foods contain the nutrients you need without too many calories. Decrease your intake of foods high in solid fats, added sugars, and salt. Eat the right amount of calories for you. Get information about a proper diet from your health care provider, if necessary.  .  Regular physical exercise is one of the most important things you can do for your health. Most adults should get at least 150 minutes of moderate-intensity exercise (any activity that increases your heart rate and causes you to sweat) each week. In addition, most adults need muscle-strengthening exercises on 2 or more days a week.  Silver Sneakers may be a benefit available to you. To determine eligibility, you may visit the website: www.silversneakers.com or contact program at [phone] Mon-Fri between 8AM-8PM.   . Maintain a healthy weight. The body mass index (BMI) is a screening tool to identify possible weight problems. It provides an estimate of body fat based on height and weight. Your health care provider can find your BMI and can help you achieve or maintain a healthy weight.   For adults 20 years and older: ? A BMI below 18.5 is considered underweight. ? A BMI of 18.5 to 24.9 is normal. ? A BMI of 25 to 29.9 is considered overweight. ? A BMI of 30 and above is considered obese.   . Maintain normal blood lipids and cholesterol levels by exercising and minimizing your intake of saturated fat. Eat a balanced diet with plenty of fruit and vegetables. Blood tests for lipids and cholesterol should begin at age 34 and be repeated every 5 years. If your lipid or cholesterol levels are high, you are over 50, or you are at high risk for heart  disease, you may need your cholesterol levels checked more frequently. Ongoing high lipid and cholesterol levels should be treated with medicines if diet and exercise are not working.  . If you smoke, find out from your health care provider how to quit. If you do not use tobacco, please do not start.  . If you choose to drink alcohol, please do not consume more than 2 drinks per day. One drink is considered to be 12 ounces (355 mL) of beer, 5 ounces (148 mL) of wine, or 1.5 ounces (44 mL) of liquor.  . If you are 3-47 years old, ask your health care  provider if you should take aspirin to prevent strokes.  . Use sunscreen. Apply sunscreen liberally and repeatedly throughout the day. You should seek shade when your shadow is shorter than you. Protect yourself by wearing long sleeves, pants, a wide-brimmed hat, and sunglasses year round, whenever you are outdoors.  . Once a month, do a whole body skin exam, using a mirror to look at the skin on your back. Tell your health care provider of new moles, moles that have irregular borders, moles that are larger than a pencil eraser, or moles that have changed in shape or color.

## 2015-12-10 NOTE — Progress Notes (Signed)
Pre visit review using our clinic review tool, if applicable. No additional management support is needed unless otherwise documented below in the visit note. 

## 2015-12-10 NOTE — Progress Notes (Signed)
Subjective:   Julie Davies is a 82 y.o. female who presents for an Initial Medicare Annual Wellness Visit.  Cardiac Risk Factors include: advanced age (>53mn, >>64women);dyslipidemia;hypertension     Objective:    Today's Vitals   12/10/15 1154 12/10/15 1205  BP: 122/78   Pulse: 76   Temp: 97.7 F (36.5 C)   TempSrc: Oral   Height: 5' 8"  (1.727 m)   Weight: 188 lb 4 oz (85.39 kg)   SpO2: 96%   PainSc: 2  2   PainLoc: Back     Current Medications (verified) Outpatient Encounter Prescriptions as of 12/10/2015  Medication Sig  . acetaminophen (TYLENOL) 500 MG tablet Take 500 mg by mouth as needed.  .Marland Kitchenalbuterol (PROVENTIL HFA;VENTOLIN HFA) 108 (90 BASE) MCG/ACT inhaler Inhale 2 puffs into the lungs every 4 (four) hours as needed for wheezing or shortness of breath.  . Alum Hydroxide-Mag Carbonate (GAVISCON EXTRA STRENGTH) 160-105 MG CHEW Chew 2 tablets by mouth as needed.  .Marland Kitchenamoxicillin (AMOXIL) 500 MG capsule TAKE 4 CAPSULES 30 MIN PRIOR TO DENTAL APPT  . aspirin 81 MG tablet Take 81 mg by mouth daily.  . clobetasol cream (TEMOVATE) 01.42% Apply 1 application topically as needed.   . docusate sodium (COLACE) 100 MG capsule Take 100 mg by mouth 2 (two) times daily as needed for mild constipation.  . Fish Oil-Cholecalciferol (OMEGA-3 FISH OIL-VITAMIN D3) 1200-1000 MG-UNIT CAPS Take 1 capsule by mouth daily.  .Marland KitchenFLAXSEED, LINSEED, PO Take by mouth.  .Cristy FriedlanderHFA 110 MCG/ACT inhaler USE 2 INHALATIONS TWICE A DAY  . fluticasone (FLONASE) 50 MCG/ACT nasal spray Place 2 sprays into both nostrils daily.  .Marland Kitchengabapentin (NEURONTIN) 300 MG capsule Take 300 mg by mouth 2 (two) times daily.   . Ginger, Zingiber officinalis, (GINGER EXTRACT) 250 MG CAPS Take 250 mg by mouth 2 (two) times daily.  .Donnie Aho(GLUCOSAMINE MSM COMPLEX) TABS Take 1 tablet by mouth 2 (two) times daily.  .Marland Kitchenlevocetirizine (XYZAL) 5 MG tablet Take 1 tablet (5 mg total) by mouth every evening.   . Misc Natural Products (BLACK CHERRY CONCENTRATE) LIQD Take by mouth.  . Multiple Vitamins-Minerals (ONE DAILY MULTIVITAMIN WOMEN PO) Take 1 tablet by mouth daily.  . Olopatadine HCl (PATADAY) 0.2 % SOLN Apply 1 drop to eye daily.  . Polyethylene Glycol 3350 GRAN 17 g by Does not apply route as needed.  . simvastatin (ZOCOR) 10 MG tablet TAKE 1 TABLET DAILY  . TURMERIC PO Take 300 mg by mouth daily.  . Wheat Dextrin (BENEFIBER) POWD Take 4 g by mouth as needed.   No facility-administered encounter medications on file as of 12/10/2015.    Allergies (verified) Review of patient's allergies indicates no known allergies.   History: Past Medical History  Diagnosis Date  . Asthma   . Arthritis   . Chicken pox   . GERD (gastroesophageal reflux disease)   . Allergy   . History of kidney stones   . Pulmonary embolism on left ([facility]    Past Surgical History  Procedure Laterality Date  . Appendectomy  1935  . Vein ligation and stripping Left 1971  . Nephrectomy Right 1975  . Replacement total knee bilateral  2013  . Dilation and curettage of uterus  1975   Family History  Problem Relation Age of Onset  . Heart disease Father   . Heart disease Maternal Aunt   . Stroke Maternal Uncle   . Cancer Neg Hx   .  Diabetes Neg Hx    Social History   Occupational History  . Not on file.   Social History Main Topics  . Smoking status: Never Smoker   . Smokeless tobacco: Never Used  . Alcohol Use: 0.0 oz/week    0 Standard drinks or equivalent per week     Comment: rare--wine  . Drug Use: No  . Sexual Activity: Not Currently    Tobacco Counseling Counseling given: No   Activities of Daily Living In your present state of health, do you have any difficulty performing the following activities: 12/10/2015  Hearing? Y  Vision? N  Difficulty concentrating or making decisions? N  Walking or climbing stairs? N  Dressing or bathing? N  Doing errands, shopping? N  Preparing Food and  eating ? N  Using the Toilet? N  In the past six months, have you accidently leaked urine? Y  Do you have problems with loss of bowel control? N  Managing your Medications? N  Managing your Finances? N  Housekeeping or managing your Housekeeping? N    Immunizations and Health Maintenance Immunization History  Administered Date(s) Administered  . Influenza, High Dose Seasonal PF 08/07/2015  . Influenza,inj,Quad PF,36+ Mos 08/13/2014  . Pneumococcal Conjugate-13 03/27/2013  . Pneumococcal Polysaccharide-23 12/10/2015  . Td 10/26/2009   There are no preventive care reminders to display for this patient.  Patient Care Team: Jearld Fenton, NP as PCP - General (Internal Medicine)   Assessment:   This is a routine wellness examination for [facility].  Hearing/Vision screen Hearing Screening Comments: Wears bilateral hearing aids Vision Screening Comments: Last eye exam was March 2016  Dietary issues and exercise activities discussed: Current Exercise Habits: Home exercise routine, Type of exercise: Other - see comments (TAI CHI - designed for arthritis ), Time (Minutes): 30, Frequency (Times/Week): 2, Weekly Exercise (Minutes/Week): 60, Intensity: Mild, Exercise limited by: None identified  Goals    . Maintain physical activity     Starting 12/10/2015, I will continue to do [facility] at least 2 times per week.       Depression Screen PHQ 2/9 Scores 12/10/2015 09/10/2014  PHQ - 2 Score 0 0    Fall Risk Fall Risk  12/10/2015 03/12/2015 09/10/2014  Falls in the past year? Yes No No  Number falls in past yr: 1 - -  Injury with Fall? Yes - -  Follow up Falls evaluation completed - -    Cognitive Function: MMSE - Mini Mental State Exam 12/10/2015  Orientation to time 5  Orientation to Place 5  Registration 3  Attention/ Calculation 5  Recall 3  Language- name 2 objects 0  Language- repeat 1  Language- follow 3 step command 3  Language- read & follow direction 1  Write a  sentence 0  Copy design 0  Total score 26    Screening Tests Health Maintenance  Topic Date Due  . DEXA SCAN  06/25/2016 (Originally 06/08/1994)  . INFLUENZA VACCINE  04/25/2016  . TETANUS/TDAP  10/27/2019  . ZOSTAVAX  Completed  . PNA vac Low Risk Adult  Completed      Plan:     I have personally reviewed the Medicare Annual Wellness questionnaire and have noted the following in the patient's chart:  A. Medical and social history B. Use of alcohol, tobacco or illicit drugs  C. Current medications and supplements D. Functional ability and status E.  Nutritional status F.  Physical activity G. Advance directives H. List of other physicians  I.  Hospitalizations, surgeries, and ER visits in previous 12 months J.  Vitals K. Screenings to include hearing, vision, cognitive, depression L. Referrals and appointments - none  In addition, I reviewed preventive protocols, quality metrics, and best practice recommendations specific to patient. A written personalized care plan for preventive services as well as general preventive health recommendations were provided to patient. Patient has been encouraged to discuss bone density screening with PCP at next appointment.   See attached scanned questionnaire for additional information.   Signed,   Lindell Noe, MHA, BS, LPN Health  [id number]/63/[id number]  I reviewed health advisor's note, was available for consultation, and agree with documentation and plan.

## 2015-12-15 DIAGNOSIS — M791 Myalgia: Secondary | ICD-10-CM | POA: Diagnosis not present

## 2015-12-15 DIAGNOSIS — M5416 Radiculopathy, lumbar region: Secondary | ICD-10-CM | POA: Diagnosis not present

## 2015-12-15 DIAGNOSIS — M47896 Other spondylosis, lumbar region: Secondary | ICD-10-CM | POA: Diagnosis not present

## 2015-12-15 DIAGNOSIS — M4806 Spinal stenosis, lumbar region: Secondary | ICD-10-CM | POA: Diagnosis not present

## 2016-01-17 ENCOUNTER — Ambulatory Visit
Admission: RE | Admit: 2016-01-17 | Discharge: 2016-01-17 | Disposition: A | Discharge status: Home / Self Care | Payer: Medicare Other | Source: Ambulatory Visit | Attending: Internal Medicine | Admitting: Internal Medicine

## 2016-01-17 DIAGNOSIS — R918 Other nonspecific abnormal finding of lung field: Secondary | ICD-10-CM | POA: Diagnosis not present

## 2016-01-17 DIAGNOSIS — K449 Diaphragmatic hernia without obstruction or gangrene: Secondary | ICD-10-CM | POA: Diagnosis not present

## 2016-01-17 DIAGNOSIS — E042 Nontoxic multinodular goiter: Secondary | ICD-10-CM | POA: Diagnosis not present

## 2016-01-17 DIAGNOSIS — J439 Emphysema, unspecified: Secondary | ICD-10-CM

## 2016-01-17 HISTORY — DX: Basal cell carcinoma of skin, unspecified: C44.91

## 2016-01-17 LAB — POCT I-STAT CREATININE: CREATININE: 0.8 mg/dL (ref 0.44–1.00)

## 2016-01-17 MED ORDER — IOPAMIDOL (ISOVUE-300) INJECTION 61%
75.0000 mL | Freq: Once | INTRAVENOUS | Status: AC | PRN
  Administered 2016-01-17: 75 mL via INTRAVENOUS

## 2016-01-18 ENCOUNTER — Telehealth: Payer: Self-pay | Admitting: *Deleted

## 2016-01-18 NOTE — Telephone Encounter (Signed)
-----   Message from Vilinda Boehringer, MD sent at 01/17/2016  3:12 PM EDT ----- Regarding: CT results Please inform patient that her CT is stable, no new nodules, no enlarged nodules; stable CT when compared to the last one.   Thank you -VM

## 2016-01-18 NOTE — Telephone Encounter (Signed)
LMOM for pt to return call in regards to results.

## 2016-01-21 ENCOUNTER — Encounter: Payer: Self-pay | Admitting: Internal Medicine

## 2016-01-21 ENCOUNTER — Ambulatory Visit (INDEPENDENT_AMBULATORY_CARE_PROVIDER_SITE_OTHER): Payer: Medicare Other | Admitting: Internal Medicine

## 2016-01-21 VITALS — BP 144/78 | HR 77 | Ht 67.5 in | Wt 190.0 lb

## 2016-01-21 DIAGNOSIS — J439 Emphysema, unspecified: Secondary | ICD-10-CM

## 2016-01-21 DIAGNOSIS — R918 Other nonspecific abnormal finding of lung field: Secondary | ICD-10-CM | POA: Diagnosis not present

## 2016-01-21 NOTE — Patient Instructions (Addendum)
Follow up with Dr. Stevenson Clinch in: 6 months - cont with exercise - CT Chest with contrast scan in 1 year for pulmonary nodoules - cont with flovent - cont with allergy control

## 2016-01-21 NOTE — Assessment & Plan Note (Signed)
Records obtained from previous pulmonologist Dr.Sever C Surdulescu [facility], Ph [phone]) Apparently patient had a bronchitis/pneumonia earlier in the year (2014), discussed follow-up with a PET CT, PET CT showed possible new lesions or new pulmonary nodules (did not get a copy of this PET/CT), a repeat CT was done followed up in July 2016 that showed 8 x 69mm enlarging right upper lobe nodule along with a 1.4 x 1.7 cm right upper lobe nodule, also. Given that she had a recent infection, these were felt to be reactive or inflammatory but malignancy cannot be completely excluded. Follow-up CT 01/17/2016 showed stable right thyroid nodule, stable right upper lobe 27mm nodule, no new nodules noted. Given findings of recent CT, there is no further workup needed at this time besides surveillance. We did discuss possible bronchoscopy of the right upper lobe dominant nodule which is 8 mm, however at this time will risk factors for lung malignancy a low, and the character 6 of the right upper lobe nodule does not show any spiculation or Sunburst pattern, or any concerns for malignancy. Patient deferred bronchoscopy at this time.  Plan: Repeat CT with contrast in one year

## 2016-01-21 NOTE — Telephone Encounter (Signed)
Received voice mail from pt this morning stating she was calling back. Pt has appt this am and VM can go over CT results.

## 2016-01-21 NOTE — Progress Notes (Signed)
Date: 01/21/2016  MRN# [id number] Julie Davies 1926-11-08  Referring Physician: NP Webb Silversmith  Julie Davies is a 82 y.o. old female seen in consultation for asthma and lung nodules  CC:  Chief Complaint  Patient presents with  . Follow-up    CT results. no new concerns today.     HPI:  Patient is a pleasant 82 year old female seen in consultation today for evaluation of lung nodules and asthma optimization. Patient has a history of lung nodules. He follow-up pulmonologist in [place], review of records show that she was following his earliest 2014 for 2 small 8 mm lower lobe nodules with mild adenopathy. Nodules are nonspecific in a patient with a history of choking, hiatal hernia and possible aspiration. At that time surveillance was recommended. Patient stated that she had her last CT in March 2016 at all pulmonologist office. Patient is a prolonged history of asthma, smoked for about 10 years quit in the 1950s, had significant secondhand smoking exposure from appearance. For her asthma she is currently on as needed albuterol, and Flovent. Patient states she has a mild intermittent slightly productive cough at times, has not had to use albuterol rescue inhaler in a few months. However she did state that prior to walking she will take 2 puffs of albuterol. Patient is a former Nature conservation officer, had several Torres in [place] and station in various areas throughout the [place] including [place], [place], [place]. Patient states she has mild dizziness today but this is a chronic problem, she lives with her daughter, she can walk up and down stairs with mild dyspnea. Review of records showed she had one asthma exacerbation in 2015 that was treated with antibiotics and steroids. As for her pulmonary nodules based on her CAT scan from 2016 patient states she has 2 new nodules for a total of 4 nodules. At today's visit there is no CD with CT scans to compare previous lung windows. Allergies:  Symptoms controlled on Xyzal, Flonase and Pataday.  Events since last clinic vist:  She presents today for follow-up visit of her pulmonary nodules in her COPD. No ED/Urgent care visits since her last pulmonary visit.  Has some allergy symptoms today, due to pollen, mild, non productive cough CT chest done, patient would to review results today.  Had an accidental fall in February, no fractures per patient, saw her doctor, and mostly with bruising and left sided back pain since then.   PMHX:   Past Medical History  Diagnosis Date  . Asthma   . Arthritis   . Chicken pox   . GERD (gastroesophageal reflux disease)   . Allergy   . History of kidney stones   . Pulmonary embolism on left ([place])   . Basal cell carcinoma of skin    Surgical Hx:  Past Surgical History  Procedure Laterality Date  . Appendectomy  1935  . Vein ligation and stripping Left 1971  . Nephrectomy Right 1975  . Replacement total knee bilateral  2013  . Dilation and curettage of uterus  1975   Family Hx:  Family History  Problem Relation Age of Onset  . Heart disease Father   . Heart disease Maternal Aunt   . Stroke Maternal Uncle   . Cancer Neg Hx   . Diabetes Neg Hx    Social Hx:   Social History  Substance Use Topics  . Smoking status: Never Smoker   . Smokeless tobacco: Never Used  . Alcohol Use: 0.0 oz/week    0 Standard  drinks or equivalent per week     Comment: rare--wine   Medication:   Current Outpatient Rx  Name  Route  Sig  Dispense  Refill  . acetaminophen (TYLENOL) 500 MG tablet   Oral   Take 500 mg by mouth as needed.         Marland Kitchen albuterol (PROVENTIL HFA;VENTOLIN HFA) 108 (90 BASE) MCG/ACT inhaler   Inhalation   Inhale 2 puffs into the lungs every 4 (four) hours as needed for wheezing or shortness of breath.   18 g   3   . Alum Hydroxide-Mag Carbonate (GAVISCON EXTRA STRENGTH) 160-105 MG CHEW   Oral   Chew 2 tablets by mouth as needed.         Marland Kitchen amoxicillin (AMOXIL) 500 MG  capsule      TAKE 4 CAPSULES 30 MIN PRIOR TO DENTAL APPT   4 capsule   0   . aspirin 81 MG tablet   Oral   Take 81 mg by mouth daily.         . clobetasol cream (TEMOVATE) 0.05 %   Topical   Apply 1 application topically as needed.          . docusate sodium (COLACE) 100 MG capsule   Oral   Take 100 mg by mouth 2 (two) times daily as needed for mild constipation.         . Fish Oil-Cholecalciferol (OMEGA-3 FISH OIL-VITAMIN D3) 1200-1000 MG-UNIT CAPS   Oral   Take 1 capsule by mouth daily.         Marland Kitchen FLAXSEED, LINSEED, PO   Oral   Take by mouth.         Cristy Friedlander HFA 110 MCG/ACT inhaler      USE 2 INHALATIONS TWICE A DAY   36 g   1   . fluticasone (FLONASE) 50 MCG/ACT nasal spray   Each Nare   Place 2 sprays into both nostrils daily.   48 g   3   . gabapentin (NEURONTIN) 300 MG capsule   Oral   Take 300 mg by mouth 2 (two) times daily.          . Ginger, Zingiber officinalis, (GINGER EXTRACT) 250 MG CAPS   Oral   Take 250 mg by mouth 2 (two) times daily.         Donnie Aho (GLUCOSAMINE MSM COMPLEX) TABS   Oral   Take 1 tablet by mouth 2 (two) times daily.         Marland Kitchen levocetirizine (XYZAL) 5 MG tablet   Oral   Take 1 tablet (5 mg total) by mouth every evening.   30 tablet   0   . Misc Natural Products (BLACK CHERRY CONCENTRATE) LIQD   Oral   Take by mouth.         . Multiple Vitamins-Minerals (ONE DAILY MULTIVITAMIN WOMEN PO)   Oral   Take 1 tablet by mouth daily.         . Olopatadine HCl (PATADAY) 0.2 % SOLN   Ophthalmic   Apply 1 drop to eye daily.   7.5 mL   0   . Polyethylene Glycol 3350 GRAN   Does not apply   17 g by Does not apply route as needed.         . simvastatin (ZOCOR) 10 MG tablet      TAKE 1 TABLET DAILY   90 tablet   3   . TURMERIC PO  Oral   Take 300 mg by mouth daily.         . Wheat Dextrin (BENEFIBER) POWD   Oral   Take 4 g by mouth as needed.              Allergies:  Review of patient's allergies indicates no known allergies.  Review of Systems: Gen:  Denies  fever, sweats, chills HEENT: Denies blurred vision, double vision, ear pain, eye pain, hearing loss, nose bleeds, sore throat Cvc:  No dizziness, chest pain or heaviness Resp:   Mild intermittent cough, mostly non productive Gi: Denies swallowing difficulty, stomach pain, nausea or vomiting, diarrhea, constipation, bowel incontinence Gu:  Denies bladder incontinence, burning urine Ext:   Back pain Skin: No skin rash, easy bruising or bleeding or hives Endoc:  No polyuria, polydipsia , polyphagia or weight change Psych: No depression, insomnia or hallucinations  Other:  All other systems negative  Physical Examination:   VS: BP 144/78 mmHg  Pulse 77  Ht 5' 7.5" (1.715 m)  Wt 190 lb (86.183 kg)  BMI 29.30 kg/m2  SpO2 96%  General Appearance: No distress  Neuro:without focal findings, mental status, speech normal, alert and oriented, cranial nerves 2-12 intact, reflexes normal and symmetric, sensation grossly normal  HEENT: PERRLA, EOM intact, no ptosis, no other lesions noticed; Mallampati 2 Pulmonary: normal breath sounds., diaphragmatic excursion normal.No wheezing, No rales;   Sputum Production:  none CardiovascularNormal S1,S2.  No m/r/g.  Abdominal aorta pulsation normal.    Abdomen: Benign, Soft, non-tender, No masses, hepatosplenomegaly, No lymphadenopathy Renal:  No costovertebral tenderness  GU:  No performed at this time. Endoc: No evident thyromegaly, no signs of acromegaly or Cushing features Skin:   warm, no rashes, no ecchymosis  Extremities: normal, no cyanosis, clubbing, no edema, warm with normal capillary refill. Other findings:none   Review of Faxed Records by Dr. Stevenson Clinch CT Chest 04/02/15 No findings to suggest interstitial lung disease, 8 x 10 mm microlobulated and slightly spiculated nodule in the posterior aspect of the right upper lobe, concerning  for potential primary bronchogenic neoplasm. Alternatively, given the patient's history of recent pneumonia, this could represent an area of post infectious or inflammatory scarring. There is an additional area of architectural distortion and slightly cephalad to this lesion within the right upper lobe which is also unusual, and favored to represent an area of scarring also. Mild diffuse bronchial wall thickening with mild centrilobular emphysema Three-vessel coronary artery disease  Pulmonary function testing 06/06/2013 FVC 98% FEV110% FEV1/FVC 79% RV 110 TLC 97 DLCO 73  RADIOLOGY - (The following images and results were reviewed by Dr. Stevenson Clinch on 01/21/2016). CT Chest 01/17/16 FINDINGS: Mediastinum/Lymph Nodes: Right thyroid nodule measuring 1.9 x 1.3 cm on image 8. This measured 2.0 x 1.6 cm in corresponding dimensions on 08/02/2015 there is smaller, poorly defined left lobe thyroid nodules without significant change.  Fluid density pericardial recesses or mediastinal cysts are unchanged. No enlarged lymph nodes. A small to moderate-sized hiatal hernia is again demonstrated.  Lungs/Pleura: There has been no significant change in multiple sub cm nodules in both lungs. No new or enlarging nodules are seen. Similar nodules were described in the report dated 05/28/2013. Mild left lower lobe bullous changes are again demonstrated.  Upper abdomen: Small left renal cysts. Cholecystectomy clips.  Musculoskeletal: Mild thoracic spine degenerative changes.  IMPRESSION: 1. Stable multiple bilateral subcentimeter lung nodules. Comparison with the CT images dated 05/28/2013 would be helpful four stat washing long-term stability. These are  stable compared to that examination, no further follow-up would be recommended. If those images cannot be obtained for direct comparison, a follow-up chest CT with contrast would be recommended in 1 year. 2. Mild left lower lobe bullous changes. 3.  Stable thyroid poles. Again recommended is consideration of further evaluation with thyroid ultrasound. If patient is clinically hyperthyroid, consider nuclear medicine thyroid uptake and scan.   Pulmonary Function Test 10/18/15  FEV1 80% FEV1/FVC 75% FVC 79%  RV 71% TLC 65% RV/TLC 106% ERV 24% DLCO 75%.  Impression: no significant obstruction or response to bronchodilators. Mild to moderate restriction noted. Severe reduction in ERV ( usually seen in abdominal obesity ), clinical correlation advised.   6 minute walk test - heart heart rate 107, total distance 994 feet /303 m , no somatic and desaturations, patient use cane during test.  Assessment and Plan: 82 year old female seen in consultation for asthma optimization and history of pulmonary nodules COPD type A ([place]) Continue with allergy control. Continue with Flovent 110, 2 puffs twice a day Patient advised avoid  any forms of tobacco including secondhand smoke, electronic cigarettes, and vapors.       Pulmonary nodules Records obtained from previous pulmonologist Dr.Sever C Surdulescu [facility] [phone]) Apparently patient had a bronchitis/pneumonia earlier in the year (2014), discussed follow-up with a PET CT, PET CT showed possible new lesions or new pulmonary nodules (did not get a copy of this PET/CT), a repeat CT was done followed up in July 2016 that showed 8 x 4mm enlarging right upper lobe nodule along with a 1.4 x 1.7 cm right upper lobe nodule, also. Given that she had a recent infection, these were felt to be reactive or inflammatory but malignancy cannot be completely excluded. Follow-up CT 01/17/2016 showed stable right thyroid nodule, stable right upper lobe 50mm nodule, no new nodules noted. Given findings of recent CT, there is no further workup needed at this time besides surveillance. We did discuss possible bronchoscopy of the right upper lobe dominant nodule which is 8 mm, however at  this time will risk factors for lung malignancy a low, and the character 6 of the right upper lobe nodule does not show any spiculation or Sunburst pattern, or any concerns for malignancy. Patient deferred bronchoscopy at this time.  Plan: Repeat CT with contrast in one year      Updated Medication List Outpatient Encounter Prescriptions as of 01/21/2016  Medication Sig  . acetaminophen (TYLENOL) 500 MG tablet Take 500 mg by mouth as needed.  Marland Kitchen albuterol (PROVENTIL HFA;VENTOLIN HFA) 108 (90 BASE) MCG/ACT inhaler Inhale 2 puffs into the lungs every 4 (four) hours as needed for wheezing or shortness of breath.  . Alum Hydroxide-Mag Carbonate (GAVISCON EXTRA STRENGTH) 160-105 MG CHEW Chew 2 tablets by mouth as needed.  Marland Kitchen amoxicillin (AMOXIL) 500 MG capsule TAKE 4 CAPSULES 30 MIN PRIOR TO DENTAL APPT  . aspirin 81 MG tablet Take 81 mg by mouth daily.  . clobetasol cream (TEMOVATE) [id number] % Apply 1 application topically as needed.   . docusate sodium (COLACE) 100 MG capsule Take 100 mg by mouth 2 (two) times daily as needed for mild constipation.  . Fish Oil-Cholecalciferol (OMEGA-3 FISH OIL-VITAMIN D3) 1200-1000 MG-UNIT CAPS Take 1 capsule by mouth daily.  Marland Kitchen FLAXSEED, LINSEED, PO Take by mouth.  Cristy Friedlander HFA 110 MCG/ACT inhaler USE 2 INHALATIONS TWICE A DAY  . fluticasone (FLONASE) 50 MCG/ACT nasal spray Place 2 sprays into both nostrils daily.  Marland Kitchen  gabapentin (NEURONTIN) 300 MG capsule Take 300 mg by mouth 2 (two) times daily.   . Ginger, Zingiber officinalis, (GINGER EXTRACT) 250 MG CAPS Take 250 mg by mouth 2 (two) times daily.  Donnie Aho (GLUCOSAMINE MSM COMPLEX) TABS Take 1 tablet by mouth 2 (two) times daily.  Marland Kitchen levocetirizine (XYZAL) 5 MG tablet Take 1 tablet (5 mg total) by mouth every evening.  . Misc Natural Products (BLACK CHERRY CONCENTRATE) LIQD Take by mouth.  . Multiple Vitamins-Minerals (ONE DAILY MULTIVITAMIN WOMEN PO) Take 1 tablet by mouth daily.  .  Olopatadine HCl (PATADAY) 0.2 % SOLN Apply 1 drop to eye daily.  . Polyethylene Glycol 3350 GRAN 17 g by Does not apply route as needed.  . simvastatin (ZOCOR) 10 MG tablet TAKE 1 TABLET DAILY  . TURMERIC PO Take 300 mg by mouth daily.  . Wheat Dextrin (BENEFIBER) POWD Take 4 g by mouth as needed.   No facility-administered encounter medications on file as of 01/21/2016.    Orders for this visit: Orders Placed This Encounter  Procedures  . CT Chest W Contrast    Standing Status: Future     Number of Occurrences:      Standing Expiration Date: 03/22/2017    Order Specific Question:  Reason for Exam (SYMPTOM  OR DIAGNOSIS REQUIRED)    Answer:  lung nodules    Order Specific Question:  Preferred imaging location?    Answer:  [facility]     Thank  you for the consultation and for allowing [facility] to assist in the care of your patient. Our recommendations are noted above.  Please contact us if we can be of further service.   Vilinda Boehringer, MD [facility] Office Number: [phone]

## 2016-01-21 NOTE — Assessment & Plan Note (Addendum)
Continue with allergy control. Continue with Flovent 110, 2 puffs twice a day Patient advised avoid  any forms of tobacco including secondhand smoke, electronic cigarettes, and vapors.

## 2016-01-31 ENCOUNTER — Other Ambulatory Visit: Payer: Self-pay | Admitting: Internal Medicine

## 2016-02-01 DIAGNOSIS — H26491 Other secondary cataract, right eye: Secondary | ICD-10-CM | POA: Diagnosis not present

## 2016-02-01 NOTE — Telephone Encounter (Signed)
Please advise if okay for pt to continue 

## 2016-02-01 NOTE — Telephone Encounter (Signed)
Medication sent electronically 

## 2016-02-18 DIAGNOSIS — H1013 Acute atopic conjunctivitis, bilateral: Secondary | ICD-10-CM | POA: Diagnosis not present

## 2016-02-18 DIAGNOSIS — H1131 Conjunctival hemorrhage, right eye: Secondary | ICD-10-CM | POA: Diagnosis not present

## 2016-03-09 DIAGNOSIS — M4806 Spinal stenosis, lumbar region: Secondary | ICD-10-CM | POA: Diagnosis not present

## 2016-03-09 DIAGNOSIS — M47896 Other spondylosis, lumbar region: Secondary | ICD-10-CM | POA: Diagnosis not present

## 2016-03-09 DIAGNOSIS — M5416 Radiculopathy, lumbar region: Secondary | ICD-10-CM | POA: Diagnosis not present

## 2016-03-09 DIAGNOSIS — M791 Myalgia: Secondary | ICD-10-CM | POA: Diagnosis not present

## 2016-04-10 ENCOUNTER — Encounter: Payer: Self-pay | Admitting: Internal Medicine

## 2016-04-10 ENCOUNTER — Ambulatory Visit (INDEPENDENT_AMBULATORY_CARE_PROVIDER_SITE_OTHER): Payer: Medicare Other | Admitting: Internal Medicine

## 2016-04-10 VITALS — BP 132/76 | HR 71 | Temp 98.7°F | Wt 187.0 lb

## 2016-04-10 DIAGNOSIS — J439 Emphysema, unspecified: Secondary | ICD-10-CM

## 2016-04-10 DIAGNOSIS — E2839 Other primary ovarian failure: Secondary | ICD-10-CM | POA: Diagnosis not present

## 2016-04-10 DIAGNOSIS — K219 Gastro-esophageal reflux disease without esophagitis: Secondary | ICD-10-CM

## 2016-04-10 DIAGNOSIS — Z13820 Encounter for screening for osteoporosis: Secondary | ICD-10-CM | POA: Diagnosis not present

## 2016-04-10 DIAGNOSIS — I10 Essential (primary) hypertension: Secondary | ICD-10-CM | POA: Diagnosis not present

## 2016-04-10 DIAGNOSIS — R918 Other nonspecific abnormal finding of lung field: Secondary | ICD-10-CM

## 2016-04-10 DIAGNOSIS — M1990 Unspecified osteoarthritis, unspecified site: Secondary | ICD-10-CM

## 2016-04-10 DIAGNOSIS — J309 Allergic rhinitis, unspecified: Secondary | ICD-10-CM

## 2016-04-10 DIAGNOSIS — E785 Hyperlipidemia, unspecified: Secondary | ICD-10-CM

## 2016-04-10 LAB — COMPREHENSIVE METABOLIC PANEL
ALT: 14 U/L (ref 0–35)
AST: 15 U/L (ref 0–37)
Albumin: 4 g/dL (ref 3.5–5.2)
Alkaline Phosphatase: 38 U/L — ABNORMAL LOW (ref 39–117)
BILIRUBIN TOTAL: 0.4 mg/dL (ref 0.2–1.2)
BUN: 20 mg/dL (ref 6–23)
CO2: 30 meq/L (ref 19–32)
Calcium: 9.3 mg/dL (ref 8.4–10.5)
Chloride: 104 mEq/L (ref 96–112)
Creatinine, Ser: 0.76 mg/dL (ref 0.40–1.20)
GFR: 76.54 mL/min (ref >60.00)
GLUCOSE: 100 mg/dL — AB (ref 70–99)
POTASSIUM: 4.2 meq/L (ref 3.5–5.1)
Sodium: 139 mEq/L (ref 135–145)
Total Protein: 6.7 g/dL (ref 6.0–8.3)

## 2016-04-10 LAB — LIPID PANEL
CHOL/HDL RATIO: 3
Cholesterol: 162 mg/dL (ref 0–200)
HDL: 63 mg/dL (ref >39.00)
LDL CALC: 86 mg/dL (ref 0–99)
NonHDL: 98.56
Triglycerides: 64 mg/dL (ref 0.0–149.0)
VLDL: 12.8 mg/dL (ref 0.0–40.0)

## 2016-04-10 LAB — VITAMIN D 25 HYDROXY (VIT D DEFICIENCY, FRACTURES): VITD: 44.81 ng/mL (ref 30.00–100.00)

## 2016-04-10 MED ORDER — AMOXICILLIN 500 MG PO CAPS: ORAL_CAPSULE | ORAL | Status: DC

## 2016-04-10 NOTE — Assessment & Plan Note (Signed)
Controlled off meds Discussed avoiding triggers

## 2016-04-10 NOTE — Assessment & Plan Note (Signed)
Continue Flovent and Albuterol She will continue to follow with Dr. Stevenson Clinch

## 2016-04-10 NOTE — Assessment & Plan Note (Signed)
Controlled off meds ECG reviewed She thinks chest tightness is stress related She declines referral to cardiology for stress testing

## 2016-04-10 NOTE — Assessment & Plan Note (Signed)
Ongoing issue She will continue Gabapentin and Tylenol She will continue to see neruosurgery at [facility]

## 2016-04-10 NOTE — Patient Instructions (Signed)

## 2016-04-10 NOTE — Assessment & Plan Note (Signed)
Controlled with Xyzal, Flonase and Pataday

## 2016-04-10 NOTE — Assessment & Plan Note (Signed)
Chest CT reviewed She will continue to follow with Dr. Stevenson Clinch

## 2016-04-10 NOTE — Assessment & Plan Note (Signed)
Encouraged her to consume a low fat diet LDL reviewed Continue Zocor

## 2016-04-10 NOTE — Progress Notes (Signed)
HPI  Pt presents to the clinic today for 6 month follow up of chronic condtions.  Asthma/COPD: Symptoms controlled on Flovent. She is using the Albuterol every morning prior to exercise. She follows with Dr. Stevenson Clinch.  Arthritis: She has ongoing pain in her hands, wrist and back. She had a  MRI of her spine. She has had injections in her back at [facility] by Dr. Minerva Ends. She does take Gabapentin every morning and Tylenol at night.   GERD: Diet controlled. She was taken off her PPI last year and has been doing well since. Will take Gaviscon about once per week.  Allergies: Symptoms controlled on Xyzal, Flonase and Pataday.  HTN: Not medicated. She has ongoing chest tightness, mostly occurs at night. She has some associated shortness of breath. She thinks it may be related to stress. Her BP today is 132/76.  HLD: Her last LDL was 89. She denies myalgias on Zocor. She does try to consume a low fat diet.  Pulmonary nodules: She is following with Dr. Stevenson Clinch. CT chest from 12/2015 reviewed.  She is also past due for her bone density exam. She has not had one in the last 7-8 years.  Past Medical History  Diagnosis Date  . Asthma   . Arthritis   . Chicken pox   . GERD (gastroesophageal reflux disease)   . Allergy   . History of kidney stones   . Pulmonary embolism on left ([place])   . Basal cell carcinoma of skin     Current Outpatient Prescriptions  Medication Sig Dispense Refill  . acetaminophen (TYLENOL) 500 MG tablet Take 500 mg by mouth as needed.    Marland Kitchen albuterol (PROVENTIL HFA;VENTOLIN HFA) 108 (90 BASE) MCG/ACT inhaler Inhale 2 puffs into the lungs every 4 (four) hours as needed for wheezing or shortness of breath. 18 g 3  . Alum Hydroxide-Mag Carbonate (GAVISCON EXTRA STRENGTH) 160-105 MG CHEW Chew 2 tablets by mouth as needed.    Marland Kitchen amoxicillin (AMOXIL) 500 MG capsule TAKE 4 CAPSULES 30 MIN PRIOR TO DENTAL APPT 4 capsule 0  . aspirin 81 MG tablet Take 81 mg by mouth daily.    . clobetasol  cream (TEMOVATE) [id number] % Apply 1 application topically as needed.     . docusate sodium (COLACE) 100 MG capsule Take 100 mg by mouth 2 (two) times daily as needed for mild constipation.    . Fish Oil-Cholecalciferol (OMEGA-3 FISH OIL-VITAMIN D3) 1200-1000 MG-UNIT CAPS Take 1 capsule by mouth daily.    Marland Kitchen FLAXSEED, LINSEED, PO Take by mouth.    Cristy Friedlander HFA 110 MCG/ACT inhaler USE 2 INHALATIONS TWICE A DAY 36 g 1  . fluticasone (FLONASE) 50 MCG/ACT nasal spray Place 2 sprays into both nostrils daily. 48 g 3  . gabapentin (NEURONTIN) 300 MG capsule Take 300 mg by mouth 2 (two) times daily.     . Ginger, Zingiber officinalis, (GINGER EXTRACT) 250 MG CAPS Take 250 mg by mouth 2 (two) times daily.    Donnie Aho (GLUCOSAMINE MSM COMPLEX) TABS Take 1 tablet by mouth 2 (two) times daily.    Marland Kitchen levocetirizine (XYZAL) 5 MG tablet Take 1 tablet (5 mg total) by mouth every evening. 30 tablet 0  . Misc Natural Products (BLACK CHERRY CONCENTRATE) LIQD Take by mouth.    . Multiple Vitamins-Minerals (ONE DAILY MULTIVITAMIN WOMEN PO) Take 1 tablet by mouth daily.    Marland Kitchen PATADAY 0.2 % SOLN INSTILL 1 DROP TO EYE DAILY 7.5 mL 1  . Polyethylene  Glycol 3350 GRAN 17 g by Does not apply route as needed.    . simvastatin (ZOCOR) 10 MG tablet TAKE 1 TABLET DAILY 90 tablet 3  . TURMERIC PO Take 300 mg by mouth daily.    . Wheat Dextrin (BENEFIBER) POWD Take 4 g by mouth as needed.     No current facility-administered medications for this visit.    No Known Allergies  Family History  Problem Relation Age of Onset  . Heart disease Father   . Heart disease Maternal Aunt   . Stroke Maternal Uncle   . Cancer Neg Hx   . Diabetes Neg Hx     Social History   Social History  . Marital Status: Widowed    Spouse Name: N/A  . Number of Children: N/A  . Years of Education: N/A   Occupational History  . Not on file.   Social History Main Topics  . Smoking status: Never Smoker   . Smokeless  tobacco: Never Used  . Alcohol Use: 0.0 oz/week    0 Standard drinks or equivalent per week     Comment: rare--wine  . Drug Use: No  . Sexual Activity: Not Currently   Other Topics Concern  . Not on file   Social History Narrative    ROS:  Constitutional: Denies fever, malaise, fatigue, headache or abrupt weight changes.  HEENT:  Denies eye pain, eye redness, ear pain, ringing in the ears, wax buildup, nasal congestion, runny nose, or sore throat. Respiratory: Denies difficulty breathing, shortness of breath, cough or sputum production.   Cardiovascular: Pt reports chest tightness, Denies chest pain, palpitations or swelling in the hands or feet.  Gastrointestinal: Denies abdominal pain, bloating, constipation, diarrhea or blood in the stool.  GU: Pt reports urinary incontinence. Denies frequency, urgency, pain with urination, blood in urine, odor or discharge. Musculoskeletal: Pt reports joint pains. Denies decrease in range of motion, difficulty with gait, muscle pain.  Skin: Denies redness, rashes, lesions or ulcercations.  Neurological: Denies dizziness, difficulty with memory, difficulty with speech or problems with coordination.  Psych: Pt reports stress. Denies anxiety, depression, SI/HI.  No other specific complaints in a complete review of systems (except as listed in HPI above).  PE:  BP 132/76 mmHg  Pulse 71  Temp(Src) 98.7 F (37.1 C) (Oral)  Wt 187 lb (84.823 kg)  SpO2 98%   Wt Readings from Last 3 Encounters:  01/21/16 190 lb (86.183 kg)  12/10/15 188 lb 4 oz (85.39 kg)  10/25/15 189 lb 14.4 oz (86.138 kg)    General: Appears her stated age, chronically ill appearing in NAD. HEENT: Head: normal shape and size, no sinus tenderness noted; Eyes: sclera white, no icterus, conjunctiva pinkt; Ears: Tm's gray and intact, normal light reflex; Throat/Mouth: mucosa pink and moist, no lesions or ulcerations noted.  Neck: No adenopathy noted. Cardiovascular: Normal  rate and rhythm. S1,S2 noted.  No murmur, rubs or gallops noted.  No carotid bruits noted. Pulmonary/Chest: Normal effort and positive vesicular breath sounds. No respiratory distress. No wheezes, rales or ronchi noted.  Abdomen: Soft and mildly tender in the LLQ. Normal bowel sounds. No distention or masses noted.  Musculoskeletal: Bilateral joint deformity of hands. Strength 5/5 BUE/BLE.  No difficulty with gait.  Neurological: Alert and oriented. Cranial nerves II-XII grossly intact. Coordination normal.    Assessment and Plan:    RTC in 6 months or sooner if needed

## 2016-04-11 LAB — CBC
HCT: 39.9 % (ref 36.0–46.0)
HEMOGLOBIN: 13.2 g/dL (ref 12.0–15.0)
MCHC: 33.2 g/dL (ref 30.0–36.0)
MCV: 85.7 fl (ref 78.0–100.0)
Platelets: 217 10*3/uL (ref 150.0–400.0)
RBC: 4.66 Mil/uL (ref 3.87–5.11)
RDW: 14 % (ref 11.5–15.5)
WBC: 4.6 10*3/uL (ref 4.0–10.5)

## 2016-04-28 ENCOUNTER — Other Ambulatory Visit: Payer: Self-pay

## 2016-04-28 MED ORDER — AZELASTINE HCL 0.05 % OP SOLN: 1.0000 [drp] | Freq: Two times a day (BID) | OPHTHALMIC | 2 refills | Status: DC

## 2016-05-02 ENCOUNTER — Ambulatory Visit
Admission: RE | Admit: 2016-05-02 | Discharge: 2016-05-02 | Disposition: A | Discharge status: Home / Self Care | Payer: Medicare Other | Source: Ambulatory Visit | Attending: Internal Medicine | Admitting: Internal Medicine

## 2016-05-02 DIAGNOSIS — M810 Age-related osteoporosis without current pathological fracture: Secondary | ICD-10-CM | POA: Diagnosis not present

## 2016-05-02 DIAGNOSIS — Z780 Asymptomatic menopausal state: Secondary | ICD-10-CM | POA: Insufficient documentation

## 2016-05-02 DIAGNOSIS — J449 Chronic obstructive pulmonary disease, unspecified: Secondary | ICD-10-CM | POA: Insufficient documentation

## 2016-05-02 DIAGNOSIS — Z13820 Encounter for screening for osteoporosis: Secondary | ICD-10-CM | POA: Diagnosis not present

## 2016-05-02 DIAGNOSIS — E2839 Other primary ovarian failure: Secondary | ICD-10-CM

## 2016-05-10 ENCOUNTER — Telehealth: Payer: Self-pay | Admitting: Internal Medicine

## 2016-05-10 NOTE — Telephone Encounter (Signed)
Pt returned your call - please call [phone] Thank you

## 2016-05-17 NOTE — Telephone Encounter (Signed)
PT RETURNED YOUR CALL 0- SHE THINKS IT IS REGARDING BONE DENSITY

## 2016-05-18 MED ORDER — ALENDRONATE SODIUM 70 MG PO TABS: 70.0000 mg | ORAL_TABLET | ORAL | 0 refills | Status: DC

## 2016-06-03 ENCOUNTER — Other Ambulatory Visit: Payer: Self-pay | Admitting: Internal Medicine

## 2016-06-09 ENCOUNTER — Ambulatory Visit (INDEPENDENT_AMBULATORY_CARE_PROVIDER_SITE_OTHER): Payer: Medicare Other | Admitting: Internal Medicine

## 2016-06-09 ENCOUNTER — Encounter: Payer: Self-pay | Admitting: Internal Medicine

## 2016-06-09 VITALS — BP 136/84 | HR 80 | Temp 98.6°F | Wt 192.0 lb

## 2016-06-09 DIAGNOSIS — W108XXA Fall (on) (from) other stairs and steps, initial encounter: Secondary | ICD-10-CM | POA: Diagnosis not present

## 2016-06-09 DIAGNOSIS — M545 Low back pain, unspecified: Secondary | ICD-10-CM

## 2016-06-09 DIAGNOSIS — Z23 Encounter for immunization: Secondary | ICD-10-CM | POA: Diagnosis not present

## 2016-06-09 DIAGNOSIS — W109XXA Fall (on) (from) unspecified stairs and steps, initial encounter: Secondary | ICD-10-CM

## 2016-06-09 DIAGNOSIS — L989 Disorder of the skin and subcutaneous tissue, unspecified: Secondary | ICD-10-CM

## 2016-06-09 NOTE — Patient Instructions (Signed)
Fall Prevention in the Home  Falls can cause injuries and can affect people from all age groups. There are many simple things that you can do to make your home safe and to help prevent falls. WHAT CAN I DO ON THE OUTSIDE OF MY HOME?  Regularly repair the edges of walkways and driveways and fix any cracks.  Remove high doorway thresholds.  Trim any shrubbery on the main path into your home.  Use bright outdoor lighting.  Clear walkways of debris and clutter, including tools and rocks.  Regularly check that handrails are securely fastened and in good repair. Both sides of any steps should have handrails.  Install guardrails along the edges of any raised decks or porches.  Have leaves, snow, and ice cleared regularly.  Use sand or salt on walkways during winter months.  In the garage, clean up any spills right away, including grease or oil spills. WHAT CAN I DO IN THE BATHROOM?  Use night lights.  Install grab bars by the toilet and in the tub and shower. Do not use towel bars as grab bars.  Use non-skid mats or decals on the floor of the tub or shower.  If you need to sit down while you are in the shower, use a plastic, non-slip stool..  Keep the floor dry. Immediately clean up any water that spills on the floor.  Remove soap buildup in the tub or shower on a regular basis.  Attach bath mats securely with double-sided non-slip rug tape.  Remove throw rugs and other tripping hazards from the floor. WHAT CAN I DO IN THE BEDROOM?  Use night lights.  Make sure that a bedside light is easy to reach.  Do not use oversized bedding that drapes onto the floor.  Have a firm chair that has side arms to use for getting dressed.  Remove throw rugs and other tripping hazards from the floor. WHAT CAN I DO IN THE KITCHEN?   Clean up any spills right away.  Avoid walking on wet floors.  Place frequently used items in easy-to-reach places.  If you need to reach for something  above you, use a sturdy step stool that has a grab bar.  Keep electrical cables out of the way.  Do not use floor polish or wax that makes floors slippery. If you have to use wax, make sure that it is non-skid floor wax.  Remove throw rugs and other tripping hazards from the floor. WHAT CAN I DO IN THE STAIRWAYS?  Do not leave any items on the stairs.  Make sure that there are handrails on both sides of the stairs. Fix handrails that are broken or loose. Make sure that handrails are as long as the stairways.  Check any carpeting to make sure that it is firmly attached to the stairs. Fix any carpet that is loose or worn.  Avoid having throw rugs at the top or bottom of stairways, or secure the rugs with carpet tape to prevent them from moving.  Make sure that you have a light switch at the top of the stairs and the bottom of the stairs. If you do not have them, have them installed. WHAT ARE SOME OTHER FALL PREVENTION TIPS?  Wear closed-toe shoes that fit well and support your feet. Wear shoes that have rubber soles or low heels.  When you use a stepladder, make sure that it is completely opened and that the sides are firmly locked. Have someone hold the ladder while you   are using it. Do not climb a closed stepladder.  Add color or contrast paint or tape to grab bars and handrails in your home. Place contrasting color strips on the first and last steps.  Use mobility aids as needed, such as canes, walkers, scooters, and crutches.  Turn on lights if it is dark. Replace any light bulbs that burn out.  Set up furniture so that there are clear paths. Keep the furniture in the same spot.  Fix any uneven floor surfaces.  Choose a carpet design that does not hide the edge of steps of a stairway.  Be aware of any and all pets.  Review your medicines with your healthcare provider. Some medicines can cause dizziness or changes in blood pressure, which increase your risk of falling. Talk  with your health care provider about other ways that you can decrease your risk of falls. This may include working with a physical therapist or trainer to improve your strength, balance, and endurance.   This information is not intended to replace advice given to you by your health care provider. Make sure you discuss any questions you have with your health care provider.   Document Released: 09/01/2002 Document Revised: 01/26/2015 Document Reviewed: 10/16/2014 Elsevier Interactive Patient Education 2016 [employer]  

## 2016-06-09 NOTE — Progress Notes (Signed)
Subjective:    Patient ID: Julie Davies, female    DOB: [birth year], 82 y.o.   MRN: [id number]  HPI  Pt presents to the clinic today with c/o a fall. She reports this occurred 5 days ago. She was going up the stairs at her home, tripped up her cane with her dog, lost her footing and landed on her right side. She reports pain in her lower back. She describes the pain as sore and achy. She rates it as a 3/10. She denies numbness or tingling in her legs or loss of bowel or bladder. She has tried Tylenol OTC with some relief. She does have osteoporosis and is taking Fosamax as prescribed. She reports she has an appt with Dr. Cristie Hem at [facility] (spine doctor) on October 5th.  She is also concerned about a spot on her right lower leg. She noticed this about 3 months ago but noticed changes in it 3 weeks ago. The spot is scaly. It is red and sore to touch. She denies drainage from the area. She has a history of basal and squamous cell carcinoma and is concerned this may be cancerous. She has not tried anything OTC for this. She made an appt with a dermatologist but reports it is not until November.   Review of Systems  Past Medical History:  Diagnosis Date  . Allergy   . Arthritis   . Asthma   . Basal cell carcinoma of skin   . Chicken pox   . GERD (gastroesophageal reflux disease)   . History of kidney stones   . Pulmonary embolism on left [facility])     Current Outpatient Prescriptions  Medication Sig Dispense Refill  . acetaminophen (TYLENOL) 500 MG tablet Take 500 mg by mouth as needed.    Marland Kitchen albuterol (PROVENTIL HFA;VENTOLIN HFA) 108 (90 BASE) MCG/ACT inhaler Inhale 2 puffs into the lungs every 4 (four) hours as needed for wheezing or shortness of breath. 18 g 3  . Alum Hydroxide-Mag Carbonate (GAVISCON EXTRA STRENGTH) 160-105 MG CHEW Chew 2 tablets by mouth as needed.    Marland Kitchen amoxicillin (AMOXIL) 500 MG capsule Take 4 capsules prior to dental visit 4 capsule 2  . aspirin 81 MG tablet Take 81 mg  by mouth daily.    Marland Kitchen azelastine (OPTIVAR) 0.05 % ophthalmic solution Place 1 drop into both eyes 2 (two) times daily. 18 mL 2  . clobetasol cream (TEMOVATE) [id number] % Apply 1 application topically as needed.     . docusate sodium (COLACE) 100 MG capsule Take 100 mg by mouth 2 (two) times daily as needed for mild constipation.    . Fish Oil-Cholecalciferol (OMEGA-3 FISH OIL-VITAMIN D3) 1200-1000 MG-UNIT CAPS Take 1 capsule by mouth daily.    Marland Kitchen FLAXSEED, LINSEED, PO Take by mouth.    Cristy Friedlander HFA 110 MCG/ACT inhaler USE 2 INHALATIONS TWICE A DAY 36 g 3  . fluticasone (FLONASE) 50 MCG/ACT nasal spray Place 2 sprays into both nostrils daily. 48 g 3  . gabapentin (NEURONTIN) 300 MG capsule Take 300 mg by mouth 2 (two) times daily.     . Ginger, Zingiber officinalis, (GINGER EXTRACT) 250 MG CAPS Take 250 mg by mouth 2 (two) times daily.    Donnie Aho (GLUCOSAMINE MSM COMPLEX) TABS Take 1 tablet by mouth 2 (two) times daily.    Marland Kitchen levocetirizine (XYZAL) 5 MG tablet Take 1 tablet (5 mg total) by mouth every evening. 30 tablet 0  . Misc Natural Products (BLACK CHERRY CONCENTRATE) LIQD  Take by mouth.    . Multiple Vitamins-Minerals (ONE DAILY MULTIVITAMIN WOMEN PO) Take 1 tablet by mouth daily.    Marland Kitchen PATADAY 0.2 % SOLN INSTILL 1 DROP TO EYE DAILY 7.5 mL 1  . Polyethylene Glycol 3350 GRAN 17 g by Does not apply route as needed.    . simvastatin (ZOCOR) 10 MG tablet TAKE 1 TABLET DAILY 90 tablet 3  . TURMERIC PO Take 300 mg by mouth daily.    . Wheat Dextrin (BENEFIBER) POWD Take 4 g by mouth as needed.    Marland Kitchen alendronate (FOSAMAX) 70 MG tablet Take 1 tablet (70 mg total) by mouth every 7 (seven) days. Take with a full glass of water on an empty stomach. (Patient not taking: Reported on 06/09/2016) 12 tablet 0   No current facility-administered medications for this visit.     No Known Allergies  Family History  Problem Relation Age of Onset  . Heart disease Father   . Heart disease  Maternal Aunt   . Stroke Maternal Uncle   . Cancer Neg Hx   . Diabetes Neg Hx     Social History   Social History  . Marital status: Widowed    Spouse name: N/A  . Number of children: N/A  . Years of education: N/A   Occupational History  . Not on file.   Social History Main Topics  . Smoking status: Never Smoker  . Smokeless tobacco: Never Used  . Alcohol use 0.0 oz/week     Comment: rare--wine  . Drug use: No  . Sexual activity: Not Currently   Other Topics Concern  . Not on file   Social History Narrative  . No narrative on file     Constitutional: Denies fever, malaise, fatigue, headache or abrupt weight changes.  Musculoskeletal: Pt reports back pain. Denies decrease in range of motion, difficulty with gait, or joint swelling.  Skin: Pt reports abnormal skin lesion of right leg. Denies rashes, or ulcercations.  Neurological: Denies dizziness, difficulty with memory, difficulty with speech or problems with balance and coordination.  Psych: Denies anxiety, depression, SI/HI.  No other specific complaints in a complete review of systems (except as listed in HPI above).     Objective:   Physical Exam   BP 136/84   Pulse 80   Temp 98.6 F (37 C) (Oral)   Wt 192 lb (87.1 kg)   SpO2 97%   BMI 29.63 kg/m  Wt Readings from Last 3 Encounters:  06/09/16 192 lb (87.1 kg)  04/10/16 187 lb (84.8 kg)  01/21/16 190 lb (86.2 kg)    General: Appears her stated age, well developed, well nourished in NAD. Skin: 1 cm raised scaly grey/black lesion noted on anterior right shin. Tender to palpation. No drainage. Slight redness but no s/s of cellulitis. Musculoskeletal: Normal flexion, extension and rotation of the spine. Tenderness noted over the lumbar spine. No difficulty with gait, using cane for assistance (baseline).  Neurological: Alert and oriented.    BMET    Component Value Date/Time   NA 139 04/10/2016 0904   K 4.2 04/10/2016 0904   CL 104 04/10/2016  0904   CO2 30 04/10/2016 0904   GLUCOSE 100 (H) 04/10/2016 0904   BUN 20 04/10/2016 0904   CREATININE 0.76 04/10/2016 0904   CALCIUM 9.3 04/10/2016 0904    Lipid Panel     Component Value Date/Time   CHOL 162 04/10/2016 0904   TRIG 64.0 04/10/2016 0904   HDL 63.00  04/10/2016 0904   CHOLHDL 3 04/10/2016 0904   VLDL 12.8 04/10/2016 0904   LDLCALC 86 04/10/2016 0904    CBC    Component Value Date/Time   WBC 4.6 04/10/2016 0904   RBC 4.66 04/10/2016 0904   HGB 13.2 04/10/2016 0904   HCT 39.9 04/10/2016 0904   PLT 217.0 04/10/2016 0904   MCV 85.7 04/10/2016 0904   MCHC 33.2 04/10/2016 0904   RDW 14.0 04/10/2016 0904    Hgb A1C No results found for: HGBA1C         Assessment & Plan:   Skin lesion of right leg:  Concern for skin cancer Will place referral to dermatology for further evaluation and treatment  Low back pain, s/p fall up stairs:  Concern for compression fracture given history of osteoporosis She declines xray of lumbar spine today Discussed fall prevention and home safety- handout given Continue Tylenol She will follow up with Dr. Drinda Butts 10/5  Flu shot given today  RTC as needed or if symptoms persist or worsen Candies Palm, NP

## 2016-06-09 NOTE — Progress Notes (Signed)
Pre visit review using our clinic review tool, if applicable. No additional management support is needed unless otherwise documented below in the visit note. 

## 2016-06-12 NOTE — Addendum Note (Signed)
Addended by: Lurlean Nanny on: 06/12/2016 05:09 PM   Modules accepted: Orders

## 2016-06-13 DIAGNOSIS — L821 Other seborrheic keratosis: Secondary | ICD-10-CM | POA: Diagnosis not present

## 2016-06-13 DIAGNOSIS — D1801 Hemangioma of skin and subcutaneous tissue: Secondary | ICD-10-CM | POA: Diagnosis not present

## 2016-06-13 DIAGNOSIS — D485 Neoplasm of uncertain behavior of skin: Secondary | ICD-10-CM | POA: Diagnosis not present

## 2016-06-13 DIAGNOSIS — C44722 Squamous cell carcinoma of skin of right lower limb, including hip: Secondary | ICD-10-CM | POA: Diagnosis not present

## 2016-06-14 IMAGING — CR DG CHEST 2V
1 series · 2 of 2 positions shown · non-contrast
Comparison: CT chest of 08/02/2015

CLINICAL DATA: Chronic cough, history of smoking

EXAM:
CHEST  2 VIEW

[Series 1: dg chest 2 view · 0.14mm/px · 2 of 2 slices shown]
[im 1/2]
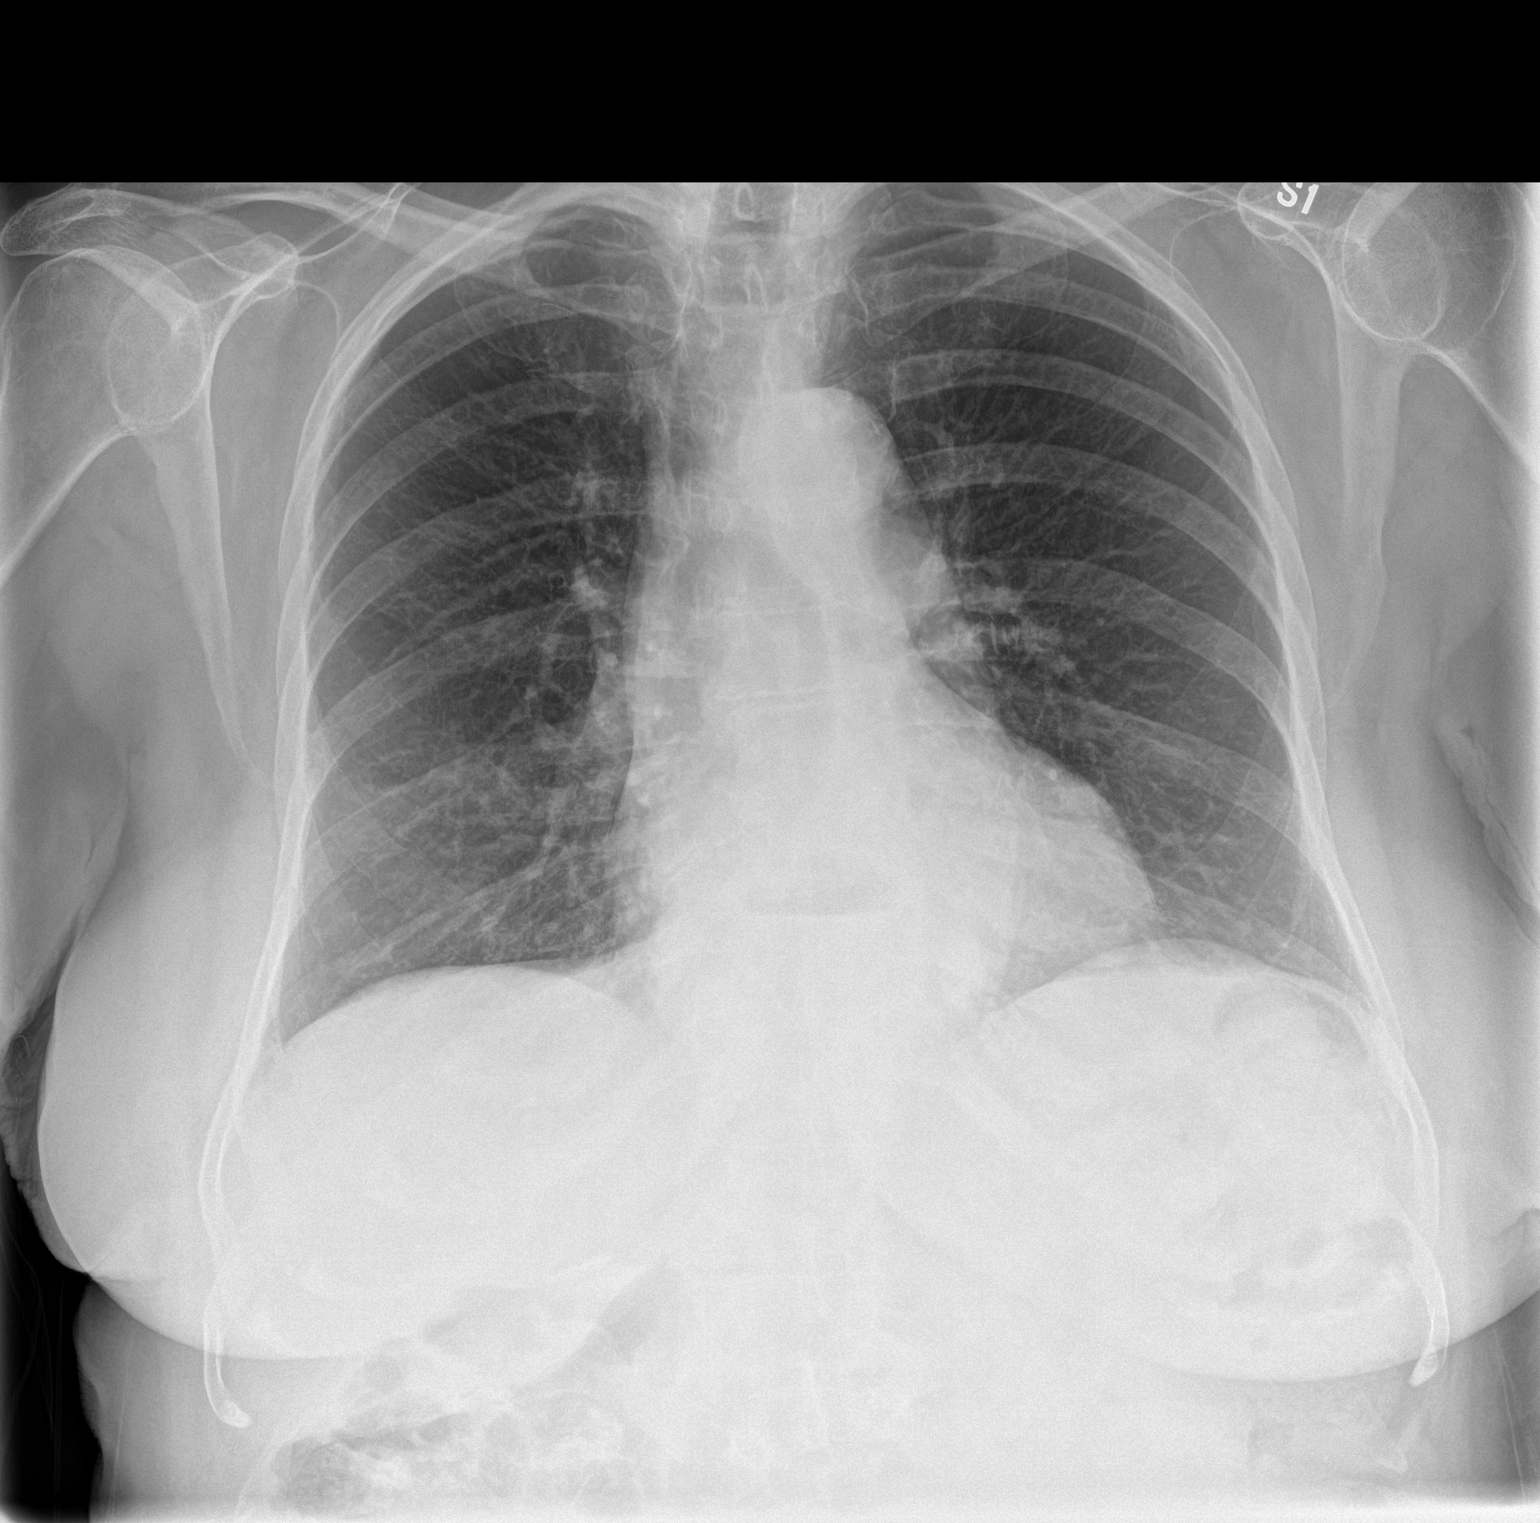
[im 2/2]
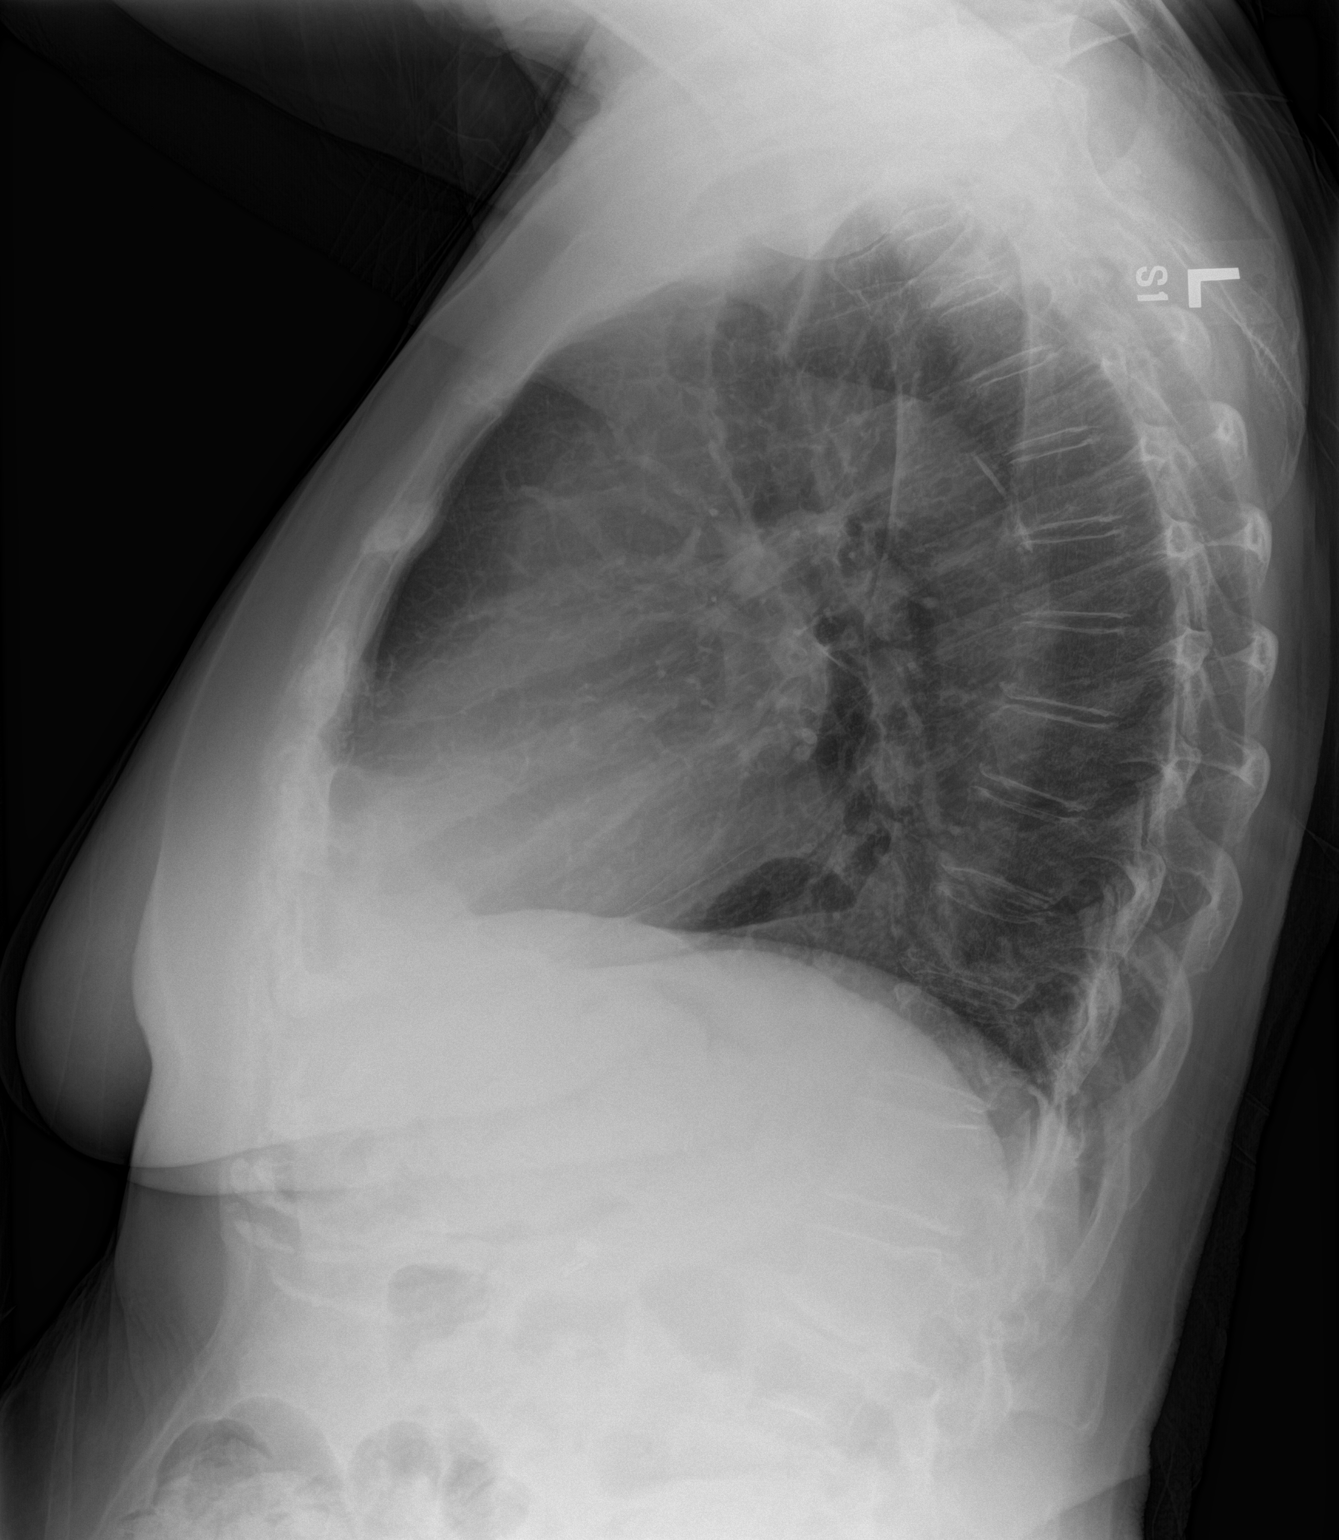

[2 of 2 positions shown; findings below may reference images not displayed]

FINDINGS: No active infiltrate or effusion is seen. On prior CT multiple small
nodules were present the largest at the left lung base. A nodule
cannot be excluded medially at the left lung base near the left
cardiophrenic angle on frontal chest x-ray. This may be in a
somewhat different position than the dominant nodule of 8 mm
described by prior CT and repeat CT could be performed if warranted.
Mediastinal and hilar contours are unremarkable. The heart is mildly
enlarged. A small hiatal hernia is again noted. The bones are
osteopenic.
IMPRESSION: 1. No active lung disease.
2. Small hiatal hernia.
3. A nodule is questioned at the left cardiophrenic angle not
definitely representing the 8 mm nodule by prior CT. Consider repeat
CT if warranted clinically.

## 2016-06-29 DIAGNOSIS — M47896 Other spondylosis, lumbar region: Secondary | ICD-10-CM | POA: Diagnosis not present

## 2016-06-29 DIAGNOSIS — M5416 Radiculopathy, lumbar region: Secondary | ICD-10-CM | POA: Diagnosis not present

## 2016-06-29 DIAGNOSIS — M48061 Spinal stenosis, lumbar region without neurogenic claudication: Secondary | ICD-10-CM | POA: Diagnosis not present

## 2016-06-29 DIAGNOSIS — M791 Myalgia: Secondary | ICD-10-CM | POA: Diagnosis not present

## 2016-07-07 DIAGNOSIS — L905 Scar conditions and fibrosis of skin: Secondary | ICD-10-CM | POA: Diagnosis not present

## 2016-07-07 DIAGNOSIS — C44722 Squamous cell carcinoma of skin of right lower limb, including hip: Secondary | ICD-10-CM | POA: Diagnosis not present

## 2016-07-21 ENCOUNTER — Telehealth: Payer: Self-pay | Admitting: Internal Medicine

## 2016-07-21 NOTE — Telephone Encounter (Signed)
Patient calling for the date and time of her ct scan? Please call patient.

## 2016-07-21 NOTE — Telephone Encounter (Signed)
Called and spoke to pt. Pt questioning when her CT chest is scheduled. Advised pt that per VM's last OV notes pt is to have a 1 year f/u in 12/2017. ROV scheduled for 07/25/16. Pt verbalized understanding and denied any further questions or concerns at this time.

## 2016-07-23 ENCOUNTER — Other Ambulatory Visit: Payer: Self-pay | Admitting: Internal Medicine

## 2016-07-25 ENCOUNTER — Encounter: Payer: Self-pay | Admitting: Internal Medicine

## 2016-07-25 ENCOUNTER — Ambulatory Visit: Payer: Medicare Other | Admitting: Internal Medicine

## 2016-07-25 ENCOUNTER — Ambulatory Visit (INDEPENDENT_AMBULATORY_CARE_PROVIDER_SITE_OTHER): Payer: Medicare Other | Admitting: Internal Medicine

## 2016-07-25 VITALS — BP 128/88 | HR 63 | Wt 193.0 lb

## 2016-07-25 DIAGNOSIS — R918 Other nonspecific abnormal finding of lung field: Secondary | ICD-10-CM

## 2016-07-25 DIAGNOSIS — J432 Centrilobular emphysema: Secondary | ICD-10-CM

## 2016-07-25 MED ORDER — PREDNISONE 20 MG PO TABS: 20.0000 mg | ORAL_TABLET | Freq: Every day | ORAL | 0 refills | Status: DC

## 2016-07-25 MED ORDER — AZITHROMYCIN 250 MG PO TABS: ORAL_TABLET | ORAL | 0 refills | Status: AC

## 2016-07-25 NOTE — Progress Notes (Signed)
Date: 07/25/2016  MRN# [id number] Julie Davies [birth year]  Referring Physician: NP Webb Silversmith  Julie Davies is a 82 y.o. old female seen in consultation for asthma and lung nodules  CC:  Chief Complaint  Patient presents with  . Follow-up    SOb w/activity; prod cough with tan mucus    HPI:  Patient is a pleasant 82 year old female seen in consultation today for evaluation of lung nodules and asthma optimization. Patient has a history of lung nodules. He follow-up pulmonologist in [place], review of records show that she was following his earliest 2014 for 2 small 8 mm lower lobe nodules with mild adenopathy. Nodules are nonspecific in a patient with a history of choking, hiatal hernia and possible aspiration. At that time surveillance was recommended. Patient stated that she had her last CT in March 2016 at all pulmonologist office. Patient is a prolonged history of asthma, smoked for about 10 years quit in the 1950s, had significant secondhand smoking exposure from appearance. For her asthma she is currently on as needed albuterol, and Flovent. Patient states she has a mild intermittent slightly productive cough at times, has not had to use albuterol rescue inhaler in a few months. However she did state that prior to walking she will take 2 puffs of albuterol. Patient is a former Nature conservation officer, had several Torres in [place] and station in various areas throughout the [place] including [place], [place], [place]. Patient states she has mild dizziness today but this is a chronic problem, she lives with her daughter, she can walk up and down stairs with mild dyspnea. Review of records showed she had one asthma exacerbation in 2015 that was treated with antibiotics and steroids. As for her pulmonary nodules based on her CAT scan from 2016 patient states she has 2 new nodules for a total of 4 nodules. At today's visit there is no CD with CT scans to compare previous lung  windows. Allergies: Symptoms controlled on Xyzal, Flonase and Pataday.  Events since last clinic vist: Patient presents today for follow-up visit of her pulmonary nodules in her COPD. Since her last visit, she has had a squamous cell carcinoma of the skin removed from her right shin, site is clean with some mild clear drainage. She's also had 3 weeks of worsening allergy symptoms to include watery eyes, runny nose, shortness of breath, cough, clear sputum production, also had sick contacts in multiple family members in the house. Today she states that she is mildly better, but still endorses worsening shortness of breath and cough.  PMHX:   Past Medical History:  Diagnosis Date  . Allergy   . Arthritis   . Asthma   . Basal cell carcinoma of skin   . Chicken pox   . GERD (gastroesophageal reflux disease)   . History of kidney stones   . Pulmonary embolism on left [facility])    Surgical Hx:  Past Surgical History:  Procedure Laterality Date  . APPENDECTOMY  1935  . DILATION AND CURETTAGE OF UTERUS  1975  . NEPHRECTOMY Right 1975  . REPLACEMENT TOTAL KNEE BILATERAL  2013  . VEIN LIGATION AND STRIPPING Left 1971   Family Hx:  Family History  Problem Relation Age of Onset  . Heart disease Father   . Stroke Maternal Uncle   . Heart disease Maternal Aunt   . Cancer Neg Hx   . Diabetes Neg Hx    Social Hx:   Social History  Substance Use Topics  . Smoking status: Never  Smoker  . Smokeless tobacco: Never Used  . Alcohol use 0.0 oz/week     Comment: rare--wine   Medication:    Current Outpatient Prescriptions:  .  acetaminophen (TYLENOL) 500 MG tablet, Take 500 mg by mouth as needed., Disp: , Rfl:  .  albuterol (PROVENTIL HFA;VENTOLIN HFA) 108 (90 BASE) MCG/ACT inhaler, Inhale 2 puffs into the lungs every 4 (four) hours as needed for wheezing or shortness of breath., Disp: 18 g, Rfl: 3 .  alendronate (FOSAMAX) 70 MG tablet, Take 1 tablet (70 mg total) by mouth every 7 (seven)  days. Take with a full glass of water on an empty stomach., Disp: 12 tablet, Rfl: 0 .  Alum Hydroxide-Mag Carbonate (GAVISCON EXTRA STRENGTH) 160-105 MG CHEW, Chew 2 tablets by mouth as needed., Disp: , Rfl:  .  amoxicillin (AMOXIL) 500 MG capsule, Take 4 capsules prior to dental visit, Disp: 4 capsule, Rfl: 2 .  aspirin 81 MG tablet, Take 81 mg by mouth daily., Disp: , Rfl:  .  docusate sodium (COLACE) 100 MG capsule, Take 100 mg by mouth 2 (two) times daily as needed for mild constipation., Disp: , Rfl:  .  Fish Oil-Cholecalciferol (OMEGA-3 FISH OIL-VITAMIN D3) 1200-1000 MG-UNIT CAPS, Take 1 capsule by mouth daily., Disp: , Rfl:  .  FLAXSEED, LINSEED, PO, Take by mouth., Disp: , Rfl:  .  FLOVENT HFA 110 MCG/ACT inhaler, USE 2 INHALATIONS TWICE A DAY, Disp: 36 g, Rfl: 3 .  gabapentin (NEURONTIN) 300 MG capsule, Take 300 mg by mouth 2 (two) times daily. , Disp: , Rfl:  .  Ginger, Zingiber officinalis, (GINGER EXTRACT) 250 MG CAPS, Take 250 mg by mouth 2 (two) times daily., Disp: , Rfl:  .  Glucos-MSM-C-Mn-Ginger-Willow (GLUCOSAMINE MSM COMPLEX) TABS, Take 1 tablet by mouth 2 (two) times daily., Disp: , Rfl:  .  levocetirizine (XYZAL) 5 MG tablet, Take 1 tablet (5 mg total) by mouth every evening., Disp: 30 tablet, Rfl: 0 .  Misc Natural Products (BLACK CHERRY CONCENTRATE) LIQD, Take by mouth., Disp: , Rfl:  .  Multiple Vitamins-Minerals (ONE DAILY MULTIVITAMIN WOMEN PO), Take 1 tablet by mouth daily., Disp: , Rfl:  .  PATADAY 0.2 % SOLN, INSTILL 1 DROP TO EYE DAILY, Disp: 7.5 mL, Rfl: 1 .  Polyethylene Glycol 3350 GRAN, 17 g by Does not apply route as needed., Disp: , Rfl:  .  simvastatin (ZOCOR) 10 MG tablet, TAKE 1 TABLET DAILY, Disp: 90 tablet, Rfl: 3 .  TURMERIC PO, Take 300 mg by mouth daily., Disp: , Rfl:  .  Wheat Dextrin (BENEFIBER) POWD, Take 4 g by mouth as needed., Disp: , Rfl:      Allergies:  Review of patient's allergies indicates no known allergies.  Review of Systems: Gen:   Denies  fever, sweats, chills HEENT: Denies blurred vision, double vision, ear pain, eye pain, hearing loss, nose bleeds, sore throat Cvc:  No dizziness, chest pain or heaviness Resp:   Mild intermittent cough, mostly non productive, shortness of breath Gi: Denies swallowing difficulty, stomach pain, nausea or vomiting, diarrhea, constipation, bowel incontinence Gu:  Denies bladder incontinence, burning urine Ext:   Back pain Skin: No skin rash, easy bruising or bleeding or hives Endoc:  No polyuria, polydipsia , polyphagia or weight change Psych: No depression, insomnia or hallucinations  Other:  All other systems negative  Physical Examination:   VS: BP 128/88 (BP Location: Left Arm, Cuff Size: Normal)   Pulse 63   Wt 193 lb (87.5  kg)   SpO2 95%   BMI 29.78 kg/m   General Appearance: No distress  Neuro:without focal findings, mental status, speech normal, alert and oriented, cranial nerves 2-12 intact, reflexes normal and symmetric, sensation grossly normal  HEENT: PERRLA, EOM intact, no ptosis, no other lesions noticed; Mallampati 2 Pulmonary: normal breath sounds., diaphragmatic excursion normal.No wheezing, No rales;   Sputum Production:  none CardiovascularNormal S1,S2.  No m/r/g.  Abdominal aorta pulsation normal.    Abdomen: Benign, Soft, non-tender, No masses, hepatosplenomegaly, No lymphadenopathy Renal:  No costovertebral tenderness  GU:  No performed at this time. Endoc: No evident thyromegaly, no signs of acromegaly or Cushing features Skin:   warm, no rashes, no ecchymosis  Extremities: Anterior right leg with small incision, clean mild erythema around the site but no significant drainage.   Review of Faxed Records by Dr. Stevenson Clinch CT Chest 04/02/15 No findings to suggest interstitial lung disease, 8 x 10 mm microlobulated and slightly spiculated nodule in the posterior aspect of the right upper lobe, concerning for potential primary bronchogenic neoplasm. Alternatively,  given the patient's history of recent pneumonia, this could represent an area of post infectious or inflammatory scarring. There is an additional area of architectural distortion and slightly cephalad to this lesion within the right upper lobe which is also unusual, and favored to represent an area of scarring also. Mild diffuse bronchial wall thickening with mild centrilobular emphysema Three-vessel coronary artery disease  Pulmonary function testing 06/06/2013 FVC 98% FEV110% FEV1/FVC 79% RV 110 TLC 97 DLCO 73   Pulmonary Function Test 10/18/15  FEV1 80% FEV1/FVC 75% FVC 79%  RV 71% TLC 65% RV/TLC 106% ERV 24% DLCO 75%.  Impression: no significant obstruction or response to bronchodilators. Mild to moderate restriction noted. Severe reduction in ERV ( usually seen in abdominal obesity ), clinical correlation advised.   6 minute walk test - heart heart rate 107, total distance 994 feet /303 m , no somatic and desaturations, patient use cane during test.  Assessment and Plan: 82 year old female seen in consultation for asthma optimization and history of pulmonary nodules COPD type A ([place]) Continue with allergy control. Continue with Flovent 110, 2 puffs twice a day Patient advised avoid  any forms of tobacco including secondhand smoke, electronic cigarettes, and vapors.  Today with mild exacerbation due to seasonal allergies, and recent sick contacts. Plan: -Prednisone 20 mg, 1 tab daily 5 days -Z-Pak, use as directed      Pulmonary nodules Records obtained from previous pulmonologist Dr.Sever C Surdulescu [facility] [phone]) Apparently patient had a bronchitis/pneumonia earlier in the year (2014), discussed follow-up with a PET CT, PET CT showed possible new lesions or new pulmonary nodules (did not get a copy of this PET/CT), a repeat CT was done followed up in July 2016 that showed 8 x 46mm enlarging right upper lobe nodule along with a 1.4 x 1.7 cm  right upper lobe nodule, also. Given that she had a recent infection, these were felt to be reactive or inflammatory but malignancy cannot be completely excluded. Follow-up CT 01/17/2016 showed stable right thyroid nodule, stable right upper lobe 18mm nodule, no new nodules noted. Given findings of recent CT, there is no further workup needed at this time besides surveillance. We did discuss possible bronchoscopy of the right upper lobe dominant nodule which is 8 mm, however at this time will risk factors for lung malignancy a low, and the character 6 of the right upper lobe nodule does not show  any spiculation or Sunburst pattern, or any concerns for malignancy. Patient deferred bronchoscopy at this time.  Plan: Repeat CAT scan of chest without contrast prior to follow-up visit in 4 months.     Updated Medication List Outpatient Encounter Prescriptions as of 07/25/2016  Medication Sig  . acetaminophen (TYLENOL) 500 MG tablet Take 500 mg by mouth as needed.  Marland Kitchen albuterol (PROVENTIL HFA;VENTOLIN HFA) 108 (90 BASE) MCG/ACT inhaler Inhale 2 puffs into the lungs every 4 (four) hours as needed for wheezing or shortness of breath.  Marland Kitchen alendronate (FOSAMAX) 70 MG tablet Take 1 tablet (70 mg total) by mouth every 7 (seven) days. Take with a full glass of water on an empty stomach.  . Alum Hydroxide-Mag Carbonate (GAVISCON EXTRA STRENGTH) 160-105 MG CHEW Chew 2 tablets by mouth as needed.  Marland Kitchen amoxicillin (AMOXIL) 500 MG capsule Take 4 capsules prior to dental visit  . aspirin 81 MG tablet Take 81 mg by mouth daily.  Marland Kitchen docusate sodium (COLACE) 100 MG capsule Take 100 mg by mouth 2 (two) times daily as needed for mild constipation.  . Fish Oil-Cholecalciferol (OMEGA-3 FISH OIL-VITAMIN D3) 1200-1000 MG-UNIT CAPS Take 1 capsule by mouth daily.  Marland Kitchen FLAXSEED, LINSEED, PO Take by mouth.  Cristy Friedlander HFA 110 MCG/ACT inhaler USE 2 INHALATIONS TWICE A DAY  . gabapentin (NEURONTIN) 300 MG capsule Take 300 mg by mouth 2  (two) times daily.   . Ginger, Zingiber officinalis, (GINGER EXTRACT) 250 MG CAPS Take 250 mg by mouth 2 (two) times daily.  Donnie Aho (GLUCOSAMINE MSM COMPLEX) TABS Take 1 tablet by mouth 2 (two) times daily.  Marland Kitchen levocetirizine (XYZAL) 5 MG tablet Take 1 tablet (5 mg total) by mouth every evening.  . Misc Natural Products (BLACK CHERRY CONCENTRATE) LIQD Take by mouth.  . Multiple Vitamins-Minerals (ONE DAILY MULTIVITAMIN WOMEN PO) Take 1 tablet by mouth daily.  Marland Kitchen PATADAY 0.2 % SOLN INSTILL 1 DROP TO EYE DAILY  . Polyethylene Glycol 3350 GRAN 17 g by Does not apply route as needed.  . simvastatin (ZOCOR) 10 MG tablet TAKE 1 TABLET DAILY  . TURMERIC PO Take 300 mg by mouth daily.  . Wheat Dextrin (BENEFIBER) POWD Take 4 g by mouth as needed.  . [DISCONTINUED] azelastine (OPTIVAR) 0.05 % ophthalmic solution Place 1 drop into both eyes 2 (two) times daily.  . [DISCONTINUED] clobetasol cream (TEMOVATE) [id number] % Apply 1 application topically as needed.   . [DISCONTINUED] fluticasone (FLONASE) 50 MCG/ACT nasal spray Place 2 sprays into both nostrils daily.   No facility-administered encounter medications on file as of 07/25/2016.     Orders for this visit: No orders of the defined types were placed in this encounter.    Thank  you for the consultation and for allowing [facility] to assist in the care of your patient. Our recommendations are noted above.  Please contact us if we can be of further service.   Julie Boehringer, MD [facility] Office Number: [phone]

## 2016-07-25 NOTE — Addendum Note (Signed)
Addended by: Oscar La R on: 07/25/2016 01:02 PM   Modules accepted: Orders

## 2016-07-25 NOTE — Patient Instructions (Addendum)
Follow up with Dr. Mortimer Fries in 4 months (69min visit) - cont with exercise as tolerated.  - Prednisone 20mg   - 1 tab PO with breakfast x 5 days - Zpak - use as directed.  - CT Chest without contrast prior to follow up visit for pulmonary nodoules - cont with flovent - cont with allergy control

## 2016-07-25 NOTE — Assessment & Plan Note (Signed)
Continue with allergy control. Continue with Flovent 110, 2 puffs twice a day Patient advised avoid  any forms of tobacco including secondhand smoke, electronic cigarettes, and vapors.  Today with mild exacerbation due to seasonal allergies, and recent sick contacts. Plan: -Prednisone 20 mg, 1 tab daily 5 days -Z-Pak, use as directed

## 2016-07-25 NOTE — Assessment & Plan Note (Signed)
Records obtained from previous pulmonologist Dr.Sever C Surdulescu [facility], [facility] [phone]) Apparently patient had a bronchitis/pneumonia earlier in the year (2014), discussed follow-up with a PET CT, PET CT showed possible new lesions or new pulmonary nodules (did not get a copy of this PET/CT), a repeat CT was done followed up in July 2016 that showed 8 x 4mm enlarging right upper lobe nodule along with a 1.4 x 1.7 cm right upper lobe nodule, also. Given that she had a recent infection, these were felt to be reactive or inflammatory but malignancy cannot be completely excluded. Follow-up CT 01/17/2016 showed stable right thyroid nodule, stable right upper lobe 60mm nodule, no new nodules noted. Given findings of recent CT, there is no further workup needed at this time besides surveillance. We did discuss possible bronchoscopy of the right upper lobe dominant nodule which is 8 mm, however at this time will risk factors for lung malignancy a low, and the character 6 of the right upper lobe nodule does not show any spiculation or Sunburst pattern, or any concerns for malignancy. Patient deferred bronchoscopy at this time.  Plan: Repeat CAT scan of chest without contrast prior to follow-up visit in 4 months.

## 2016-07-31 DIAGNOSIS — H26491 Other secondary cataract, right eye: Secondary | ICD-10-CM | POA: Diagnosis not present

## 2016-08-15 DIAGNOSIS — H26492 Other secondary cataract, left eye: Secondary | ICD-10-CM | POA: Diagnosis not present

## 2016-08-17 ENCOUNTER — Other Ambulatory Visit: Payer: Self-pay | Admitting: Internal Medicine

## 2016-10-02 DIAGNOSIS — M48061 Spinal stenosis, lumbar region without neurogenic claudication: Secondary | ICD-10-CM | POA: Diagnosis not present

## 2016-10-02 DIAGNOSIS — M791 Myalgia: Secondary | ICD-10-CM | POA: Diagnosis not present

## 2016-10-02 DIAGNOSIS — M47896 Other spondylosis, lumbar region: Secondary | ICD-10-CM | POA: Diagnosis not present

## 2016-10-02 DIAGNOSIS — M5416 Radiculopathy, lumbar region: Secondary | ICD-10-CM | POA: Diagnosis not present

## 2016-10-12 ENCOUNTER — Ambulatory Visit: Payer: Medicare Other | Admitting: Internal Medicine

## 2016-10-16 ENCOUNTER — Encounter: Payer: Self-pay | Admitting: Internal Medicine

## 2016-10-16 ENCOUNTER — Ambulatory Visit (INDEPENDENT_AMBULATORY_CARE_PROVIDER_SITE_OTHER): Payer: Medicare Other | Admitting: Internal Medicine

## 2016-10-16 VITALS — BP 126/82 | HR 88 | Temp 97.4°F | Wt 194.0 lb

## 2016-10-16 DIAGNOSIS — I10 Essential (primary) hypertension: Secondary | ICD-10-CM | POA: Diagnosis not present

## 2016-10-16 DIAGNOSIS — K219 Gastro-esophageal reflux disease without esophagitis: Secondary | ICD-10-CM

## 2016-10-16 DIAGNOSIS — J3089 Other allergic rhinitis: Secondary | ICD-10-CM

## 2016-10-16 DIAGNOSIS — E7800 Pure hypercholesterolemia, unspecified: Secondary | ICD-10-CM

## 2016-10-16 DIAGNOSIS — M810 Age-related osteoporosis without current pathological fracture: Secondary | ICD-10-CM | POA: Insufficient documentation

## 2016-10-16 DIAGNOSIS — J439 Emphysema, unspecified: Secondary | ICD-10-CM

## 2016-10-16 DIAGNOSIS — M1990 Unspecified osteoarthritis, unspecified site: Secondary | ICD-10-CM

## 2016-10-16 DIAGNOSIS — R918 Other nonspecific abnormal finding of lung field: Secondary | ICD-10-CM

## 2016-10-16 LAB — COMPREHENSIVE METABOLIC PANEL
ALT: 13 U/L (ref 0–35)
AST: 14 U/L (ref 0–37)
Albumin: 4.1 g/dL (ref 3.5–5.2)
Alkaline Phosphatase: 34 U/L — ABNORMAL LOW (ref 39–117)
BILIRUBIN TOTAL: 0.4 mg/dL (ref 0.2–1.2)
BUN: 19 mg/dL (ref 6–23)
CALCIUM: 9.8 mg/dL (ref 8.4–10.5)
CHLORIDE: 102 meq/L (ref 96–112)
CO2: 31 meq/L (ref 19–32)
Creatinine, Ser: 0.83 mg/dL (ref 0.40–1.20)
GFR: 69.06 mL/min (ref >60.00)
Glucose, Bld: 96 mg/dL (ref 70–99)
POTASSIUM: 4.2 meq/L (ref 3.5–5.1)
Sodium: 139 mEq/L (ref 135–145)
Total Protein: 7 g/dL (ref 6.0–8.3)

## 2016-10-16 LAB — CBC
HCT: 37.6 % (ref 36.0–46.0)
HEMOGLOBIN: 12.6 g/dL (ref 12.0–15.0)
MCHC: 33.6 g/dL (ref 30.0–36.0)
MCV: 85.4 fl (ref 78.0–100.0)
PLATELETS: 207 10*3/uL (ref 150.0–400.0)
RBC: 4.41 Mil/uL (ref 3.87–5.11)
RDW: 13.6 % (ref 11.5–15.5)
WBC: 4 10*3/uL (ref 4.0–10.5)

## 2016-10-16 LAB — VITAMIN D 25 HYDROXY (VIT D DEFICIENCY, FRACTURES): VITD: 52.75 ng/mL (ref 30.00–100.00)

## 2016-10-16 LAB — LIPID PANEL
CHOL/HDL RATIO: 2
Cholesterol: 161 mg/dL (ref 0–200)
HDL: 64.6 mg/dL (ref >39.00)
LDL Cholesterol: 76 mg/dL (ref 0–99)
NonHDL: 96.46
TRIGLYCERIDES: 104 mg/dL (ref 0.0–149.0)
VLDL: 20.8 mg/dL (ref 0.0–40.0)

## 2016-10-16 MED ORDER — ALBUTEROL SULFATE HFA 108 (90 BASE) MCG/ACT IN AERS: 2.0000 | INHALATION_SPRAY | RESPIRATORY_TRACT | 3 refills | Status: DC | PRN

## 2016-10-16 MED ORDER — OLOPATADINE HCL 0.2 % OP SOLN: OPHTHALMIC | 1 refills | Status: DC

## 2016-10-16 MED ORDER — ALENDRONATE SODIUM 70 MG PO TABS: ORAL_TABLET | ORAL | 1 refills | Status: DC

## 2016-10-16 NOTE — Assessment & Plan Note (Signed)
She will continue to try to control this with diet Continue Gaviscon prn

## 2016-10-16 NOTE — Assessment & Plan Note (Signed)
CMET and Lipid profile today Encouraged her to consume a low fat diet Continue Zocor, will adjust if needed based on labs.

## 2016-10-16 NOTE — Patient Instructions (Signed)
Fat and Cholesterol Restricted Diet Introduction Getting too much fat and cholesterol in your diet may cause health problems. Following this diet helps keep your fat and cholesterol at normal levels. This can keep you from getting sick. What types of fat should I choose?  Choose monosaturated and polyunsaturated fats. These are found in foods such as olive oil, canola oil, flaxseeds, walnuts, almonds, and seeds.  Eat more omega-3 fats. Good choices include salmon, mackerel, sardines, tuna, flaxseed oil, and ground flaxseeds.  Limit saturated fats. These are in animal products such as meats, butter, and cream. They can also be in plant products such as palm oil, palm kernel oil, and coconut oil.  Avoid foods with partially hydrogenated oils in them. These contain trans fats. Examples of foods that have trans fats are stick margarine, some tub margarines, cookies, crackers, and other baked goods. What general guidelines do I need to follow?  Check food labels. Look for the words "trans fat" and "saturated fat."  When preparing a meal:  Fill half of your plate with vegetables and green salads.  Fill one fourth of your plate with whole grains. Look for the word "whole" as the first word in the ingredient list.  Fill one fourth of your plate with lean protein foods.  Eat more foods that have fiber, like apples, carrots, beans, peas, and barley.  Eat more home-cooked foods. Eat less at restaurants and buffets.  Limit or avoid alcohol.  Limit foods high in starch and sugar.  Limit fried foods.  Cook foods without frying them. Baking, boiling, grilling, and broiling are all great options.  Lose weight if you are overweight. Losing even a small amount of weight can help your overall health. It can also help prevent diseases such as diabetes and heart disease. What foods can I eat? Grains  Whole grains, such as whole wheat or whole grain breads, crackers, cereals, and pasta. Unsweetened  oatmeal, bulgur, barley, quinoa, or brown rice. Corn or whole wheat flour tortillas. Vegetables  Fresh or frozen vegetables (raw, steamed, roasted, or grilled). Green salads. Fruits  All fresh, canned (in natural juice), or frozen fruits. Meat and Other Protein Products  Ground beef (85% or leaner), grass-fed beef, or beef trimmed of fat. Skinless chicken or turkey. Ground chicken or turkey. Pork trimmed of fat. All fish and seafood. Eggs. Dried beans, peas, or lentils. Unsalted nuts or seeds. Unsalted canned or dry beans. Dairy  Low-fat dairy products, such as skim or 1% milk, 2% or reduced-fat cheeses, low-fat ricotta or cottage cheese, or plain low-fat yogurt. Fats and Oils  Tub margarines without trans fats. Light or reduced-fat mayonnaise and salad dressings. Avocado. Olive, canola, sesame, or safflower oils. Natural peanut or almond butter (choose ones without added sugar and oil). The items listed above may not be a complete list of recommended foods or beverages. Contact your dietitian for more options.  What foods are not recommended? Grains  White bread. White pasta. White rice. Cornbread. Bagels, pastries, and croissants. Crackers that contain trans fat. Vegetables  White potatoes. Corn. Creamed or fried vegetables. Vegetables in a cheese sauce. Fruits  Dried fruits. Canned fruit in light or heavy syrup. Fruit juice. Meat and Other Protein Products  Fatty cuts of meat. Ribs, chicken wings, bacon, sausage, bologna, salami, chitterlings, fatback, hot dogs, bratwurst, and packaged luncheon meats. Liver and organ meats. Dairy  Whole or 2% milk, cream, half-and-half, and cream cheese. Whole milk cheeses. Whole-fat or sweetened yogurt. Full-fat cheeses. Nondairy creamers and   whipped toppings. Processed cheese, cheese spreads, or cheese curds. Sweets and Desserts  Corn syrup, sugars, honey, and molasses. Candy. Jam and jelly. Syrup. Sweetened cereals. Cookies, pies, cakes, donuts,  muffins, and ice cream. Fats and Oils  Butter, stick margarine, lard, shortening, ghee, or bacon fat. Coconut, palm kernel, or palm oils. Beverages  Alcohol. Sweetened drinks (such as sodas, lemonade, and fruit drinks or punches). The items listed above may not be a complete list of foods and beverages to avoid. Contact your dietitian for more information.  This information is not intended to replace advice given to you by your health care provider. Make sure you discuss any questions you have with your health care provider. Document Released: 03/12/2012 Document Revised: 05/18/2016 Document Reviewed: 12/11/2013  2017 Elsevier  

## 2016-10-16 NOTE — Assessment & Plan Note (Signed)
She wants to continue Fosamax Will follow bone density exams She is currently working with an Chief Financial Officer to repair her dental problems Vit D today

## 2016-10-16 NOTE — Assessment & Plan Note (Signed)
She is following with Dr. Filbert Berthold She will continue Flovent and Albuterol Albuterol refilled today

## 2016-10-16 NOTE — Assessment & Plan Note (Signed)
She will have repeat CT 12/2016 She will follow with Dr. Filbert Berthold

## 2016-10-16 NOTE — Assessment & Plan Note (Signed)
Chronic Continue Xyzal, Pataday and Flonase Pataday refilled today

## 2016-10-16 NOTE — Progress Notes (Signed)
Subjective:    Patient ID: Julie Davies, female    DOB: 06-23-1927, 83 y.o.   MRN: [id number]  HPI  Pt presents to the clinic today to follow up chronic conditions:  COPD and Pulmonary Nodules. Symptoms controlled on Flovent. She uses her Albuterol inhaler daily prior to exercise. She had a CT Chest 12/2015. She follows with Dr. Filbert Berthold. She is requesting a refill of her Albuterol inhaler.  Arthritis: Mainly in her hands, wrist and back. She reports worsening low back pain and she is going to start physical therapy tomorrow. She is taking Gabapentin 2 x day and Tylenol at night with good relief. She follows with Dr. Glo Herring at [facility].   GERD: She is not sure what triggers this. Her symptoms are mainly diet controlled. If she does have a flare, she will take Gaviscon as needed with good relief.  Allergies: Occur all year long. She takes Xyzal, Flonase and Pataday. She is requesting a refill of her Pataday today.   HTN: Her BP today is 126/82. She is currently not taking any medication for HTN. ECG from 02/2015.  HLD: Her last LDL was 86, 03/2016. She is taking Zocor as prescribed, denies myalgias. She does consume a low fat diet.  Osteoporosis: She reports she is having dental trouble because of the Fosamax. She is about to have her teeth extracted and implants put in. She wants to continue the Fosamax for her bones and is requesting a refill of the Fosamax today.  Review of Systems      Past Medical History:  Diagnosis Date  . Allergy   . Arthritis   . Asthma   . Basal cell carcinoma of skin   . Chicken pox   . GERD (gastroesophageal reflux disease)   . History of kidney stones   . Pulmonary embolism on left [facility])     Current Outpatient Prescriptions  Medication Sig Dispense Refill  . acetaminophen (TYLENOL) 500 MG tablet Take 500 mg by mouth as needed.    Marland Kitchen albuterol (PROVENTIL HFA;VENTOLIN HFA) 108 (90 BASE) MCG/ACT inhaler Inhale 2 puffs into the lungs every 4 (four) hours as  needed for wheezing or shortness of breath. 18 g 3  . alendronate (FOSAMAX) 70 MG tablet TAKE 1 TABLET EVERY 7 DAYS WITH A FULL GLASS OF WATER ON AN EMPTY STOMACH 12 tablet 0  . Alum Hydroxide-Mag Carbonate (GAVISCON EXTRA STRENGTH) 160-105 MG CHEW Chew 2 tablets by mouth as needed.    Marland Kitchen amoxicillin (AMOXIL) 500 MG capsule Take 4 capsules prior to dental visit 4 capsule 2  . aspirin 81 MG tablet Take 81 mg by mouth daily.    Marland Kitchen docusate sodium (COLACE) 100 MG capsule Take 100 mg by mouth 2 (two) times daily as needed for mild constipation.    . Fish Oil-Cholecalciferol (OMEGA-3 FISH OIL-VITAMIN D3) 1200-1000 MG-UNIT CAPS Take 1 capsule by mouth daily.    Marland Kitchen FLAXSEED, LINSEED, PO Take by mouth.    Cristy Friedlander HFA 110 MCG/ACT inhaler USE 2 INHALATIONS TWICE A DAY 36 g 3  . gabapentin (NEURONTIN) 300 MG capsule Take 300 mg by mouth 2 (two) times daily.     . Ginger, Zingiber officinalis, (GINGER EXTRACT) 250 MG CAPS Take 250 mg by mouth 2 (two) times daily.    Donnie Aho (GLUCOSAMINE MSM COMPLEX) TABS Take 1 tablet by mouth 2 (two) times daily.    Marland Kitchen levocetirizine (XYZAL) 5 MG tablet Take 1 tablet (5 mg total) by mouth every evening. [place]  tablet 0  . Misc Natural Products (BLACK CHERRY CONCENTRATE) LIQD Take by mouth.    . Multiple Vitamins-Minerals (ONE DAILY MULTIVITAMIN WOMEN PO) Take 1 tablet by mouth daily.    Marland Kitchen PATADAY 0.2 % SOLN INSTILL 1 DROP TO EYE DAILY 7.5 mL 1  . Polyethylene Glycol 3350 GRAN 17 g by Does not apply route as needed.    . predniSONE (DELTASONE) 20 MG tablet Take 1 tablet (20 mg total) by mouth daily with breakfast. 5 tablet 0  . simvastatin (ZOCOR) 10 MG tablet TAKE 1 TABLET DAILY 90 tablet 1  . TURMERIC PO Take 300 mg by mouth daily.    . Wheat Dextrin (BENEFIBER) POWD Take 4 g by mouth as needed.     No current facility-administered medications for this visit.     No Known Allergies  Family History  Problem Relation Age of Onset  . Heart disease  Father   . Stroke Maternal Uncle   . Heart disease Maternal Aunt   . Cancer Neg Hx   . Diabetes Neg Hx     Social History   Social History  . Marital status: Widowed    Spouse name: N/A  . Number of children: N/A  . Years of education: N/A   Occupational History  . Not on file.   Social History Main Topics  . Smoking status: Never Smoker  . Smokeless tobacco: Never Used  . Alcohol use 0.0 oz/week     Comment: rare--wine  . Drug use: No  . Sexual activity: Not Currently   Other Topics Concern  . Not on file   Social History Narrative  . No narrative on file     Constitutional: Denies fever, malaise, fatigue, headache or abrupt weight changes.  HEENT: Pt reports missing teeth. Denies eye pain, eye redness, ear pain, ringing in the ears, wax buildup, runny nose, nasal congestion, bloody nose, or sore throat. Respiratory: Denies difficulty breathing, shortness of breath, cough or sputum production.   Cardiovascular: Pt reports intermittent chest tightness. Denies chest pain, palpitations or swelling in the hands or feet.  Gastrointestinal: Pt reports intermittent reflux and constipation. Denies abdominal pain, bloating, diarrhea or blood in the stool.  GU: Denies urgency, frequency, pain with urination, burning sensation, blood in urine, odor or discharge. Musculoskeletal: Pt reports joint pain. Denies decrease in range of motion, difficulty with gait, muscle pain or joint swelling.  Skin: Denies redness, rashes, lesions or ulcercations.  Neurological: Denies dizziness, difficulty with memory, difficulty with speech or problems with balance and coordination.  Psych: Denies anxiety, depression, SI/HI.  No other specific complaints in a complete review of systems (except as listed in HPI above).  Objective:   Physical Exam  BP 126/82   Pulse 88   Temp 97.4 F (36.3 C) (Oral)   Wt 194 lb (88 kg)   SpO2 96%   BMI 29.94 kg/m  Wt Readings from Last 3 Encounters:    10/16/16 194 lb (88 kg)  07/25/16 193 lb (87.5 kg)  06/09/16 192 lb (87.1 kg)    General: Appears her stated age, chronically ill appearing, in NAD. HEENT: Head: normal shape and size; Ears: Tm's gray and intact, normal light reflex;  Throat/Mouth: Teeth missing, mucosa pink and moist, no exudate, lesions or ulcerations noted.  Cardiovascular: Normal rate and rhythm. S1,S2 noted.  No murmur, rubs or gallops noted. No JVD or BLE edema. No carotid bruits noted. Pulmonary/Chest: Normal effort and positive vesicular breath sounds. No respiratory distress. No  wheezes, rales or ronchi noted.  Abdomen: Soft and nontender. Normal bowel sounds. No distention or masses noted.  Musculoskeletal: Multiple joint enlargement noted in fingers. Tender to palpation over the lumbar spine. Gait slow but steady with the use of a cane. Neurological: Alert and oriented.    BMET    Component Value Date/Time   NA 139 04/10/2016 0904   K 4.2 04/10/2016 0904   CL 104 04/10/2016 0904   CO2 30 04/10/2016 0904   GLUCOSE 100 (H) 04/10/2016 0904   BUN 20 04/10/2016 0904   CREATININE 0.76 04/10/2016 0904   CALCIUM 9.3 04/10/2016 0904    Lipid Panel     Component Value Date/Time   CHOL 162 04/10/2016 0904   TRIG 64.0 04/10/2016 0904   HDL 63.00 04/10/2016 0904   CHOLHDL 3 04/10/2016 0904   VLDL 12.8 04/10/2016 0904   LDLCALC 86 04/10/2016 0904    CBC    Component Value Date/Time   WBC 4.6 04/10/2016 0904   RBC 4.66 04/10/2016 0904   HGB 13.2 04/10/2016 0904   HCT 39.9 04/10/2016 0904   PLT 217.0 04/10/2016 0904   MCV 85.7 04/10/2016 0904   MCHC 33.2 04/10/2016 0904   RDW 14.0 04/10/2016 0904    Hgb A1C No results found for: HGBA1C         Assessment & Plan:

## 2016-10-16 NOTE — Assessment & Plan Note (Signed)
Worsening in her back She starts PT tomorrow She will continue Tylenol and Gabapentin as prescribed She will continue to follow with Dr. Riley Churches

## 2016-10-16 NOTE — Assessment & Plan Note (Signed)
Controlled off meds CBC and CMET today Will monitor 

## 2016-10-17 DIAGNOSIS — M48061 Spinal stenosis, lumbar region without neurogenic claudication: Secondary | ICD-10-CM | POA: Diagnosis not present

## 2016-10-17 DIAGNOSIS — M545 Low back pain: Secondary | ICD-10-CM | POA: Diagnosis not present

## 2016-10-20 MED ORDER — ALBUTEROL SULFATE HFA 108 (90 BASE) MCG/ACT IN AERS: 2.0000 | INHALATION_SPRAY | RESPIRATORY_TRACT | 3 refills | Status: DC | PRN

## 2016-10-20 MED ORDER — OLOPATADINE HCL 0.2 % OP SOLN: OPHTHALMIC | 1 refills | Status: DC

## 2016-10-20 MED ORDER — ALENDRONATE SODIUM 70 MG PO TABS: ORAL_TABLET | ORAL | 1 refills | Status: DC

## 2016-10-20 NOTE — Addendum Note (Signed)
Addended by: Lurlean Nanny on: 10/20/2016 04:33 PM   Modules accepted: Orders

## 2016-10-20 NOTE — Addendum Note (Signed)
Addended by: Lurlean Nanny on: 10/20/2016 04:36 PM   Modules accepted: Orders

## 2016-10-25 DIAGNOSIS — M545 Low back pain: Secondary | ICD-10-CM | POA: Diagnosis not present

## 2016-10-25 DIAGNOSIS — M5416 Radiculopathy, lumbar region: Secondary | ICD-10-CM | POA: Diagnosis not present

## 2016-10-31 DIAGNOSIS — M545 Low back pain: Secondary | ICD-10-CM | POA: Diagnosis not present

## 2016-10-31 DIAGNOSIS — M5416 Radiculopathy, lumbar region: Secondary | ICD-10-CM | POA: Diagnosis not present

## 2016-11-07 DIAGNOSIS — M545 Low back pain: Secondary | ICD-10-CM | POA: Diagnosis not present

## 2016-11-07 DIAGNOSIS — M5416 Radiculopathy, lumbar region: Secondary | ICD-10-CM | POA: Diagnosis not present

## 2016-11-09 ENCOUNTER — Telehealth: Payer: Self-pay | Admitting: Internal Medicine

## 2016-11-09 NOTE — Telephone Encounter (Signed)
Patient wants to schedule CT prior to April 13 appt.

## 2016-11-10 ENCOUNTER — Other Ambulatory Visit: Payer: Self-pay | Admitting: *Deleted

## 2016-11-10 DIAGNOSIS — R911 Solitary pulmonary nodule: Secondary | ICD-10-CM

## 2016-11-10 NOTE — Telephone Encounter (Signed)
Contacted patient and scheduled CT Chest with contrast for Tues 01/02/17 at 1:30 at [facility]. No solid foods 4 hours before. Water only. Pt is aware of appointment date, time and to arrive at 1:00 to draw labs prior to CT.  Pt voiced understanding and repeated the appointment date, time, location and instructions back to me. Nothing else needed at this time. Rhonda J Cobb

## 2016-11-16 DIAGNOSIS — M5416 Radiculopathy, lumbar region: Secondary | ICD-10-CM | POA: Diagnosis not present

## 2016-11-16 DIAGNOSIS — M545 Low back pain: Secondary | ICD-10-CM | POA: Diagnosis not present

## 2016-11-23 DIAGNOSIS — M48061 Spinal stenosis, lumbar region without neurogenic claudication: Secondary | ICD-10-CM | POA: Diagnosis not present

## 2016-11-23 DIAGNOSIS — M545 Low back pain: Secondary | ICD-10-CM | POA: Diagnosis not present

## 2016-12-04 DIAGNOSIS — M791 Myalgia: Secondary | ICD-10-CM | POA: Diagnosis not present

## 2016-12-04 DIAGNOSIS — M48061 Spinal stenosis, lumbar region without neurogenic claudication: Secondary | ICD-10-CM | POA: Diagnosis not present

## 2016-12-04 DIAGNOSIS — M47896 Other spondylosis, lumbar region: Secondary | ICD-10-CM | POA: Diagnosis not present

## 2016-12-04 DIAGNOSIS — M5416 Radiculopathy, lumbar region: Secondary | ICD-10-CM | POA: Diagnosis not present

## 2016-12-12 ENCOUNTER — Ambulatory Visit: Payer: Medicare Other | Admitting: Internal Medicine

## 2016-12-12 DIAGNOSIS — L538 Other specified erythematous conditions: Secondary | ICD-10-CM | POA: Diagnosis not present

## 2016-12-12 DIAGNOSIS — D1801 Hemangioma of skin and subcutaneous tissue: Secondary | ICD-10-CM | POA: Diagnosis not present

## 2016-12-12 DIAGNOSIS — L821 Other seborrheic keratosis: Secondary | ICD-10-CM | POA: Diagnosis not present

## 2016-12-12 DIAGNOSIS — L820 Inflamed seborrheic keratosis: Secondary | ICD-10-CM | POA: Diagnosis not present

## 2016-12-12 DIAGNOSIS — Z85828 Personal history of other malignant neoplasm of skin: Secondary | ICD-10-CM | POA: Diagnosis not present

## 2016-12-12 DIAGNOSIS — Z08 Encounter for follow-up examination after completed treatment for malignant neoplasm: Secondary | ICD-10-CM | POA: Diagnosis not present

## 2016-12-12 DIAGNOSIS — L298 Other pruritus: Secondary | ICD-10-CM | POA: Diagnosis not present

## 2016-12-13 DIAGNOSIS — M545 Low back pain: Secondary | ICD-10-CM | POA: Diagnosis not present

## 2016-12-13 DIAGNOSIS — M5416 Radiculopathy, lumbar region: Secondary | ICD-10-CM | POA: Diagnosis not present

## 2016-12-28 DIAGNOSIS — M545 Low back pain: Secondary | ICD-10-CM | POA: Diagnosis not present

## 2016-12-28 DIAGNOSIS — M5416 Radiculopathy, lumbar region: Secondary | ICD-10-CM | POA: Diagnosis not present

## 2017-01-02 ENCOUNTER — Ambulatory Visit
Admission: RE | Admit: 2017-01-02 | Discharge: 2017-01-02 | Disposition: A | Discharge status: Home / Self Care | Payer: Medicare Other | Source: Ambulatory Visit | Attending: Internal Medicine | Admitting: Internal Medicine

## 2017-01-02 DIAGNOSIS — I2510 Atherosclerotic heart disease of native coronary artery without angina pectoris: Secondary | ICD-10-CM | POA: Diagnosis not present

## 2017-01-02 DIAGNOSIS — K449 Diaphragmatic hernia without obstruction or gangrene: Secondary | ICD-10-CM | POA: Diagnosis not present

## 2017-01-02 DIAGNOSIS — I700 Atherosclerosis of aorta: Secondary | ICD-10-CM | POA: Diagnosis not present

## 2017-01-02 DIAGNOSIS — Z9049 Acquired absence of other specified parts of digestive tract: Secondary | ICD-10-CM | POA: Diagnosis not present

## 2017-01-02 DIAGNOSIS — R918 Other nonspecific abnormal finding of lung field: Secondary | ICD-10-CM | POA: Insufficient documentation

## 2017-01-02 DIAGNOSIS — Z87891 Personal history of nicotine dependence: Secondary | ICD-10-CM | POA: Insufficient documentation

## 2017-01-02 DIAGNOSIS — R911 Solitary pulmonary nodule: Secondary | ICD-10-CM | POA: Diagnosis not present

## 2017-01-02 LAB — POCT I-STAT CREATININE: CREATININE: 0.9 mg/dL (ref 0.44–1.00)

## 2017-01-02 MED ORDER — IOPAMIDOL (ISOVUE-300) INJECTION 61%
75.0000 mL | Freq: Once | INTRAVENOUS | Status: AC | PRN
  Administered 2017-01-02: 75 mL via INTRAVENOUS

## 2017-01-05 ENCOUNTER — Encounter: Payer: Self-pay | Admitting: Internal Medicine

## 2017-01-05 ENCOUNTER — Ambulatory Visit (INDEPENDENT_AMBULATORY_CARE_PROVIDER_SITE_OTHER): Payer: Medicare Other | Admitting: Internal Medicine

## 2017-01-05 VITALS — BP 136/74 | HR 66 | Wt 198.0 lb

## 2017-01-05 DIAGNOSIS — J4520 Mild intermittent asthma, uncomplicated: Secondary | ICD-10-CM

## 2017-01-05 DIAGNOSIS — R918 Other nonspecific abnormal finding of lung field: Secondary | ICD-10-CM

## 2017-01-05 NOTE — Progress Notes (Signed)
Date: 01/05/2017  MRN# [id number] Julie Davies 1927/04/06  Referring Physician: NP Webb Silversmith  Julie Davies is a 83 y.o. old female seen in consultation for asthma and lung nodules  CC:  Follow up ASTHMA  PREVIOUS HISTORY Follow up Patient is a pleasant 83 year old female for evaluation of lung nodules and asthma optimization. Patient has a history of lung nodules. He follow-up pulmonologist in [place], review of records show that she was following his earliest 2014 for 2 small 8 mm lower lobe nodules with mild adenopathy. Nodules are nonspecific in a patient with a history of choking, hiatal hernia and possible aspiration. At that time surveillance was recommended. Patient stated that she had her last CT in March 2016 at all pulmonologist office. Patient is a prolonged history of asthma, smoked for about 10 years quit in the 1950s, had significant secondhand smoking exposure from appearance. For her asthma she is currently on as needed albuterol, and Flovent. Patient states she has a mild intermittent slightly productive cough at times, has not had to use albuterol rescue inhaler in a few months. However she did state that prior to walking she will take 2 puffs of albuterol. 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 Patient states she has mild dizziness today but this is a chronic problem, she lives with her daughter, she can walk up and down stairs with mild dyspnea. Review of records showed she had one asthma exacerbation in 2015 that was treated with antibiotics and steroids. As for her pulmonary nodules based on her CAT scan from 2016 patient states she has 2 new nodules for a total of 4 nodules. At today's visit there is no CD with CT scans to compare previous lung windows. Allergies: Symptoms controlled on Xyzal, Flonase and Pataday.  HPI Patient presents today for  follow-up visit of her pulmonary nodules in her ASTHMA Today she states that she is mildly better, but still endorses worsening shortness of breath and cough. She uses flovent and albuterol daily She has no signs of infection at this time No exacerbation at this time  I have reviewed CT Chest 12/2016 with patient there is new RUL GGO, all other nodules are stable and no growth   Medication:    Current Outpatient Prescriptions:  .  acetaminophen (TYLENOL) 500 MG tablet, Take 500 mg by mouth as needed., Disp: , Rfl:  .  albuterol (PROVENTIL HFA;VENTOLIN HFA) 108 (90 Base) MCG/ACT inhaler, Inhale 2 puffs into the lungs every 4 (four) hours as needed for wheezing or shortness of breath., Disp: 54 g, Rfl: 3 .  alendronate (FOSAMAX) 70 MG tablet, TAKE 1 TABLET EVERY 7 DAYS WITH A FULL GLASS OF WATER ON AN EMPTY STOMACH, Disp: 12 tablet, Rfl: 1 .  Alum Hydroxide-Mag Carbonate (GAVISCON EXTRA STRENGTH) 160-105 MG CHEW, Chew 2 tablets by mouth as needed., Disp: , Rfl:  .  amoxicillin (AMOXIL) 500 MG capsule, Take 4 capsules prior to dental visit, Disp: 4 capsule, Rfl: 2 .  aspirin 81 MG tablet, Take 81 mg by mouth daily., Disp: , Rfl:  .  docusate sodium (COLACE) 100 MG capsule, Take 100 mg by mouth 2 (two) times daily as needed for mild constipation., Disp: , Rfl:  .  Fish Oil-Cholecalciferol (OMEGA-3 FISH OIL-VITAMIN D3) 1200-1000 MG-UNIT CAPS, Take 1 capsule by mouth daily., Disp: , Rfl:  .  FLAXSEED, LINSEED, PO, Take by mouth., Disp: , Rfl:  .  FLOVENT HFA 110 MCG/ACT inhaler, USE 2 INHALATIONS TWICE A DAY, Disp: 36 g, Rfl: 3 .  gabapentin (NEURONTIN) 300 MG capsule, Take 300 mg by mouth 2 (two) times daily. , Disp: , Rfl:  .  Ginger, Zingiber officinalis, (GINGER EXTRACT) 250 MG CAPS, Take 250 mg by mouth 2 (two) times daily., Disp: , Rfl:  .  Glucos-MSM-C-Mn-Ginger-Willow (GLUCOSAMINE MSM COMPLEX) TABS, Take 1 tablet by mouth 2 (two) times daily., Disp: , Rfl:  .  levocetirizine (XYZAL) 5 MG  tablet, Take 1 tablet (5 mg total) by mouth every evening., Disp: 30 tablet, Rfl: 0 .  Misc Natural Products (BLACK CHERRY CONCENTRATE) LIQD, Take by mouth., Disp: , Rfl:  .  Multiple Vitamins-Minerals (ONE DAILY MULTIVITAMIN WOMEN PO), Take 1 tablet by mouth daily., Disp: , Rfl:  .  Olopatadine HCl (PATADAY) 0.2 % SOLN, INSTILL 1 DROP TO EYE DAILY, Disp: 7.5 mL, Rfl: 1 .  Olopatadine HCl (PATADAY) 0.2 % SOLN, INSTILL 1 DROP TO EYE DAILY, Disp: 7.5 mL, Rfl: 1 .  Polyethylene Glycol 3350 GRAN, 17 g by Does not apply route as needed., Disp: , Rfl:  .  simvastatin (ZOCOR) 10 MG tablet, TAKE 1 TABLET DAILY, Disp: 90 tablet, Rfl: 1 .  TURMERIC PO, Take 300 mg by mouth daily., Disp: , Rfl:  .  Wheat Dextrin (BENEFIBER) POWD, Take 4 g by mouth as needed., Disp: , Rfl:      Allergies:  Tramadol  Review of Systems: Gen:  Denies  fever, sweats, chills HEENT: Denies blurred vision, double vision, ear pain, eye pain, hearing loss, nose bleeds, sore throat Cvc:  No dizziness, chest pain or heaviness Resp:   Mild intermittent cough, mostly non productive, shortness of breath Other:  All other systems negative  Physical Examination:   BP 136/74 (BP Location: Left Arm, Cuff Size: Normal)   Pulse 66   Wt 198 lb (89.8 kg)   SpO2 96%   BMI 30.55 kg/m   General Appearance: No distress  Neuro:without focal findings, mental status, speech normal, alert and oriented, cranial nerves 2-12 intact, reflexes normal and symmetric, sensation grossly normal  HEENT: PERRLA, EOM intact, no ptosis, no other lesions noticed; Mallampati 2 Pulmonary: normal breath sounds., diaphragmatic excursion normal.No wheezing, No rales;   Sputum Production:  none CardiovascularNormal S1,S2.  No m/r/g.  Abdominal aorta pulsation normal.    Extremities: Anterior right leg with small incision, clean mild erythema around the site but no significant drainage.   CT Chest 04/02/15 No findings to suggest interstitial lung disease, 8  x 10 mm microlobulated and slightly spiculated nodule in the posterior aspect of the right upper lobe, concerning for potential primary bronchogenic neoplasm. Alternatively, given the patient's history of recent pneumonia, this could represent an area of post infectious or inflammatory scarring. There is an additional area of architectural distortion and slightly cephalad to this lesion within the right upper lobe which is also unusual, and favored to represent an area of scarring also. Mild diffuse bronchial wall thickening with mild centrilobular emphysema Three-vessel coronary artery disease  Pulmonary function testing 06/06/2013 FVC 98% FEV110% FEV1/FVC 79% RV 110 TLC 97 DLCO 73   Pulmonary Function Test 10/18/15  FEV1 80% FEV1/FVC 75% FVC 79%  RV 71% TLC 65% RV/TLC 106% ERV 24% DLCO 75%.  Impression: no significant obstruction or response to bronchodilators. Mild to moderate restriction noted. Severe reduction in ERV ( usually seen in abdominal obesity ), clinical correlation advised.   6 minute walk test - heart  heart rate 107, total distance 994 feet /303 m , no somatic and desaturations, patient use cane during test.  Assessment and Plan: 83 year old female follow up  for asthma-mild intermittent With h/o  pulmonary nodules -Continue with allergy control. -change flovent to Symbicort and re-assess resp status in 3 months -Patient advised avoid  any forms of tobacco including secondhand smoke, electronic cigarettes, and vapors.  Pulmonary nodules-previous work up of nodules Records obtained from previous pulmonologist Dr.Sever C Surdulescu [facility], Ph [phone]) Apparently patient had a bronchitis/pneumonia earlier in the year (2014), discussed follow-up with a PET CT, PET CT showed possible new lesions or new pulmonary nodules (did not get a copy of this PET/CT), a repeat CT was done followed up in July 2016 that showed 8 x 92mm enlarging right upper lobe  nodule along with a 1.4 x 1.7 cm right upper lobe nodule, also. Given that she had a recent infection, these were felt to be reactive or inflammatory but malignancy cannot be completely excluded. Follow-up CT 01/17/2016 showed stable right thyroid nodule, stable right upper lobe 8mm nodule, no new nodules noted. Given findings of recent CT, there is no further workup needed at this time besides surveillance. We did discuss possible bronchoscopy of the right upper lobe dominant nodule which is 8 mm, however at this time will risk factors for lung malignancy a low, and the character 6 of the right upper lobe nodule does not show any spiculation or Sunburst pattern, or any concerns for malignancy. Patient deferred bronchoscopy at this time.  Current CT scan of chest without contrast shows new RUL GGO new from previous CT scan Will need repeat CT chest in 3 months     Patient satisfied with Plan of action and management. All questions answered  Corrin Parker, M.D.  [facility]  Medical Director [place] Director [facility] Cardio-Pulmonary Department

## 2017-01-05 NOTE — Patient Instructions (Addendum)
Change flovent to Symbicort Follow up CT chest in 3 months

## 2017-01-11 DIAGNOSIS — M545 Low back pain: Secondary | ICD-10-CM | POA: Diagnosis not present

## 2017-01-11 DIAGNOSIS — M5416 Radiculopathy, lumbar region: Secondary | ICD-10-CM | POA: Diagnosis not present

## 2017-01-25 DIAGNOSIS — M5416 Radiculopathy, lumbar region: Secondary | ICD-10-CM | POA: Diagnosis not present

## 2017-01-25 DIAGNOSIS — M545 Low back pain: Secondary | ICD-10-CM | POA: Diagnosis not present

## 2017-02-19 ENCOUNTER — Other Ambulatory Visit: Payer: Self-pay | Admitting: Internal Medicine

## 2017-02-20 DIAGNOSIS — M47896 Other spondylosis, lumbar region: Secondary | ICD-10-CM | POA: Diagnosis not present

## 2017-02-20 DIAGNOSIS — M5416 Radiculopathy, lumbar region: Secondary | ICD-10-CM | POA: Diagnosis not present

## 2017-02-20 DIAGNOSIS — M791 Myalgia: Secondary | ICD-10-CM | POA: Diagnosis not present

## 2017-02-20 DIAGNOSIS — M48061 Spinal stenosis, lumbar region without neurogenic claudication: Secondary | ICD-10-CM | POA: Diagnosis not present

## 2017-02-20 DIAGNOSIS — M545 Low back pain: Secondary | ICD-10-CM | POA: Diagnosis not present

## 2017-02-20 DIAGNOSIS — M25551 Pain in right hip: Secondary | ICD-10-CM | POA: Diagnosis not present

## 2017-02-21 ENCOUNTER — Telehealth: Payer: Self-pay | Admitting: Internal Medicine

## 2017-02-21 MED ORDER — BUDESONIDE-FORMOTEROL FUMARATE 160-4.5 MCG/ACT IN AERO
2.0000 | INHALATION_SPRAY | Freq: Two times a day (BID) | RESPIRATORY_TRACT | 0 refills | Status: DC
Start: 2017-02-21 — End: 2017-02-21

## 2017-02-21 MED ORDER — BUDESONIDE-FORMOTEROL FUMARATE 160-4.5 MCG/ACT IN AERO: 2.0000 | INHALATION_SPRAY | Freq: Two times a day (BID) | RESPIRATORY_TRACT | 3 refills | Status: DC

## 2017-02-21 NOTE — Telephone Encounter (Signed)
Spoke with pt and informed her that we would send 1 inhaler to local pharmacy and then will [facility] 3 month supply. RXs sent. Nothing further needed.

## 2017-02-21 NOTE — Telephone Encounter (Signed)
Pt is calling stating she needs a prescription called for Symbicort   Please call into [facility] on [address]   If she is to be on this for a long term then please call and [facility]  She is running low   Please let patient know.

## 2017-02-27 ENCOUNTER — Other Ambulatory Visit: Payer: Self-pay | Admitting: Internal Medicine

## 2017-02-27 DIAGNOSIS — M5416 Radiculopathy, lumbar region: Secondary | ICD-10-CM | POA: Diagnosis not present

## 2017-02-27 DIAGNOSIS — M48061 Spinal stenosis, lumbar region without neurogenic claudication: Secondary | ICD-10-CM | POA: Diagnosis not present

## 2017-02-27 DIAGNOSIS — M25551 Pain in right hip: Secondary | ICD-10-CM | POA: Diagnosis not present

## 2017-02-27 DIAGNOSIS — M47896 Other spondylosis, lumbar region: Secondary | ICD-10-CM | POA: Diagnosis not present

## 2017-02-27 DIAGNOSIS — M791 Myalgia: Secondary | ICD-10-CM | POA: Diagnosis not present

## 2017-02-27 MED ORDER — FLUTICASONE-SALMETEROL 115-21 MCG/ACT IN AERO
2.0000 | INHALATION_SPRAY | Freq: Two times a day (BID) | RESPIRATORY_TRACT | 3 refills | Status: DC
Start: 2017-02-27 — End: 2017-02-27

## 2017-02-27 MED ORDER — FLUTICASONE-SALMETEROL 115-21 MCG/ACT IN AERO: 2.0000 | INHALATION_SPRAY | Freq: Two times a day (BID) | RESPIRATORY_TRACT | 3 refills | Status: DC

## 2017-02-27 MED ORDER — FLUTICASONE-SALMETEROL 115-21 MCG/ACT IN AERO: 2.0000 | INHALATION_SPRAY | Freq: Two times a day (BID) | RESPIRATORY_TRACT | 0 refills | Status: DC

## 2017-02-27 NOTE — Telephone Encounter (Signed)
Patient aware Symbicort changed to Advair HFA 115-21.

## 2017-03-12 ENCOUNTER — Ambulatory Visit: Payer: Medicare Other | Admitting: Internal Medicine

## 2017-03-12 ENCOUNTER — Ambulatory Visit (INDEPENDENT_AMBULATORY_CARE_PROVIDER_SITE_OTHER): Payer: Medicare Other | Admitting: Primary Care

## 2017-03-12 ENCOUNTER — Encounter: Payer: Self-pay | Admitting: Primary Care

## 2017-03-12 VITALS — BP 124/84 | HR 83 | Temp 97.7°F | Wt 188.1 lb

## 2017-03-12 DIAGNOSIS — R5383 Other fatigue: Secondary | ICD-10-CM

## 2017-03-12 DIAGNOSIS — J432 Centrilobular emphysema: Secondary | ICD-10-CM | POA: Diagnosis not present

## 2017-03-12 DIAGNOSIS — K219 Gastro-esophageal reflux disease without esophagitis: Secondary | ICD-10-CM | POA: Diagnosis not present

## 2017-03-12 DIAGNOSIS — R42 Dizziness and giddiness: Secondary | ICD-10-CM | POA: Diagnosis not present

## 2017-03-12 LAB — BASIC METABOLIC PANEL
BUN: 22 mg/dL (ref 6–23)
CHLORIDE: 102 meq/L (ref 96–112)
CO2: 28 mEq/L (ref 19–32)
CREATININE: 0.96 mg/dL (ref 0.40–1.20)
Calcium: 9.7 mg/dL (ref 8.4–10.5)
GFR: 58.33 mL/min — ABNORMAL LOW (ref >60.00)
GLUCOSE: 95 mg/dL (ref 70–99)
POTASSIUM: 4.4 meq/L (ref 3.5–5.1)
Sodium: 136 mEq/L (ref 135–145)

## 2017-03-12 LAB — CBC
HEMATOCRIT: 39.5 % (ref 36.0–46.0)
HEMOGLOBIN: 12.9 g/dL (ref 12.0–15.0)
MCHC: 32.8 g/dL (ref 30.0–36.0)
MCV: 86.2 fl (ref 78.0–100.0)
Platelets: 228 10*3/uL (ref 150.0–400.0)
RBC: 4.58 Mil/uL (ref 3.87–5.11)
RDW: 13.9 % (ref 11.5–15.5)
WBC: 5 10*3/uL (ref 4.0–10.5)

## 2017-03-12 LAB — TSH: TSH: 1.2 u[IU]/mL (ref 0.35–4.50)

## 2017-03-12 MED ORDER — RANITIDINE HCL 150 MG PO TABS: 150.0000 mg | ORAL_TABLET | Freq: Two times a day (BID) | ORAL | 1 refills | Status: DC

## 2017-03-12 NOTE — Progress Notes (Signed)
Subjective:    Patient ID: Julie Davies, female    DOB: 10-Jun-1927, 83 y.o.   MRN: [id number]  HPI  Ms. Kibler is an 83 year old female with a history of hypertension, COPD, GERD who presents today with a chief complaint of fatigue. She also reports intermittent dizziness and GERD.  Her dizziness has been intermittent for the past 1 year. She will experience dizziness with positional changes and also when rest. She will also experience fatigue. Her dizziness and fatigue spell will last for about 5 minutes and will occur 3-4 times weekly. She will sometimes experience shortness of breath, headaches, and chest tightness. Her tightness will occur to the lower substernal region.  She is currently active and exercises several times weekly. She tries to walk but will have to stop often to rest due to fatigue and shortness of breath. She's also confused about her current inhaler regimen. Recently prescribed Advair and is using this along with her Flovent. Her albuterol inhaler was discontinued.  Currently managed on Gaviscon PRN for GERD.She will experience her GERD symptoms 2-3 times weekly on average with relief from Gaviscon. She was previously managed on Zantac in the past and did well. She tries to work on her diet and does sleep with her head elevated.   Review of Systems  Constitutional: Positive for fatigue. Negative for fever.  HENT: Negative for congestion.   Eyes: Negative for visual disturbance.  Respiratory: Positive for shortness of breath. Negative for wheezing.   Cardiovascular: Negative for chest pain.  Gastrointestinal:       Gerd  Neurological: Positive for dizziness and headaches.        Past Medical History:  Diagnosis Date  . Allergy   . Arthritis   . Asthma   . Basal cell carcinoma of skin   . Chicken pox   . GERD (gastroesophageal reflux disease)   . History of kidney stones   . Pulmonary embolism on left [facility])      Social History   Social History  .  Marital status: Widowed    Spouse name: N/A  . Number of children: N/A  . Years of education: N/A   Occupational History  . Not on file.   Social History Main Topics  . Smoking status: Never Smoker  . Smokeless tobacco: Never Used  . Alcohol use 0.0 oz/week     Comment: rare--wine  . Drug use: No  . Sexual activity: Not Currently   Other Topics Concern  . Not on file   Social History Narrative  . No narrative on file    Past Surgical History:  Procedure Laterality Date  . APPENDECTOMY  1935  . DILATION AND CURETTAGE OF UTERUS  1975  . NEPHRECTOMY Right 1975  . REPLACEMENT TOTAL KNEE BILATERAL  2013  . VEIN LIGATION AND STRIPPING Left 1971    Family History  Problem Relation Age of Onset  . Heart disease Father   . Stroke Maternal Uncle   . Heart disease Maternal Aunt   . Cancer Neg Hx   . Diabetes Neg Hx     Allergies  Allergen Reactions  . Tramadol Other (See Comments)    Pt states she had a syncopal episode and confusion with Elev BP.     Current Outpatient Prescriptions on File Prior to Visit  Medication Sig Dispense Refill  . acetaminophen (TYLENOL) 500 MG tablet Take 500 mg by mouth as needed.    Marland Kitchen albuterol (PROVENTIL HFA;VENTOLIN HFA) 108 (90 Base)  MCG/ACT inhaler Inhale 2 puffs into the lungs every 4 (four) hours as needed for wheezing or shortness of breath. 54 g 3  . alendronate (FOSAMAX) 70 MG tablet TAKE 1 TABLET EVERY 7 DAYS WITH A FULL GLASS OF WATER ON AN EMPTY STOMACH 12 tablet 1  . Alum Hydroxide-Mag Carbonate (GAVISCON EXTRA STRENGTH) 160-105 MG CHEW Chew 2 tablets by mouth as needed.    Marland Kitchen aspirin 81 MG tablet Take 81 mg by mouth daily.    . budesonide-formoterol (SYMBICORT) 160-4.5 MCG/ACT inhaler Inhale 2 puffs into the lungs 2 (two) times daily. 3 Inhaler 3  . docusate sodium (COLACE) 100 MG capsule Take 100 mg by mouth 2 (two) times daily as needed for mild constipation.    . Fish Oil-Cholecalciferol (OMEGA-3 FISH OIL-VITAMIN D3) 1200-1000  MG-UNIT CAPS Take 1 capsule by mouth daily.    Marland Kitchen FLAXSEED, LINSEED, PO Take by mouth.    . fluticasone-salmeterol (ADVAIR HFA) 115-21 MCG/ACT inhaler Inhale 2 puffs into the lungs 2 (two) times daily. 3 Inhaler 3  . gabapentin (NEURONTIN) 300 MG capsule Take 300 mg by mouth 2 (two) times daily.     . Ginger, Zingiber officinalis, (GINGER EXTRACT) 250 MG CAPS Take 250 mg by mouth 2 (two) times daily.    Donnie Aho (GLUCOSAMINE MSM COMPLEX) TABS Take 1 tablet by mouth 2 (two) times daily.    Marland Kitchen levocetirizine (XYZAL) 5 MG tablet Take 1 tablet (5 mg total) by mouth every evening. 30 tablet 0  . Misc Natural Products (BLACK CHERRY CONCENTRATE) LIQD Take by mouth.    . Multiple Vitamins-Minerals (ONE DAILY MULTIVITAMIN WOMEN PO) Take 1 tablet by mouth daily.    . Olopatadine HCl (PATADAY) 0.2 % SOLN INSTILL 1 DROP TO EYE DAILY 7.5 mL 1  . Polyethylene Glycol 3350 GRAN 17 g by Does not apply route as needed.    . simvastatin (ZOCOR) 10 MG tablet TAKE 1 TABLET DAILY 90 tablet 0  . TURMERIC PO Take 300 mg by mouth daily.    . Wheat Dextrin (BENEFIBER) POWD Take 4 g by mouth as needed.    Marland Kitchen FLOVENT HFA 110 MCG/ACT inhaler USE 2 INHALATIONS TWICE A DAY (Patient not taking: Reported on 03/12/2017) 36 g 3   No current facility-administered medications on file prior to visit.     BP 124/84   Pulse 83   Temp 97.7 F (36.5 C) (Oral)   Wt 188 lb 1.9 oz (85.3 kg)   SpO2 98%   BMI 29.03 kg/m    Objective:   Physical Exam  Constitutional: She is oriented to person, place, and time. She appears well-nourished.  Eyes: EOM are normal.  Neck: Neck supple.  Cardiovascular: Normal rate and regular rhythm.   Pulmonary/Chest: Effort normal and breath sounds normal.  Neurological: She is alert and oriented to person, place, and time. No cranial nerve deficit.  Skin: Skin is warm and dry.  Psychiatric: She has a normal mood and affect.          Assessment & Plan:   Dizziness:  Also with fatigue intermittently x 1 year. Doesn't seem like vertigo or depression.  ECG unchanged from 2016, orthostatic vitals unremarkable, so low chance for cardiac involvement. Check labs today to ensure no metabolic cause. Discussed to speak with her pulmonologist about her current regimen as this could be contributing. Continue regular exercise.   Sheral Flow, NP

## 2017-03-12 NOTE — Assessment & Plan Note (Signed)
Now managed on Advair, also using Flovent. Discussed that she should just be doing one, not both, but to check with her pulmonologist. She will call them later today.

## 2017-03-12 NOTE — Assessment & Plan Note (Signed)
Symptoms 2-3 times weekly, relief with Gaviscon. Will have her try Zantac for 4-6 weeks, then stop. Discussed triggers for GERD.

## 2017-03-12 NOTE — Patient Instructions (Addendum)
Complete lab work prior to leaving today. I will notify you of your results once received.   Start ranitidine (Zantac) 150 mg tablets for acid reflux. Take 1 tablet by mouth twice daily.  Make sure to change positions slowly to prevent dizziness.  You can try a medication called Meclizine for dizziness. This may be purchased over the counter. It may cause drowsiness.  It was a pleasure meeting you!

## 2017-04-12 ENCOUNTER — Ambulatory Visit
Admission: RE | Admit: 2017-04-12 | Discharge: 2017-04-12 | Disposition: A | Discharge status: Home / Self Care | Payer: Medicare Other | Source: Ambulatory Visit | Attending: Internal Medicine | Admitting: Internal Medicine

## 2017-04-12 DIAGNOSIS — K449 Diaphragmatic hernia without obstruction or gangrene: Secondary | ICD-10-CM | POA: Insufficient documentation

## 2017-04-12 DIAGNOSIS — I700 Atherosclerosis of aorta: Secondary | ICD-10-CM | POA: Insufficient documentation

## 2017-04-12 DIAGNOSIS — J4520 Mild intermittent asthma, uncomplicated: Secondary | ICD-10-CM | POA: Diagnosis present

## 2017-04-12 DIAGNOSIS — J439 Emphysema, unspecified: Secondary | ICD-10-CM | POA: Insufficient documentation

## 2017-04-12 DIAGNOSIS — R918 Other nonspecific abnormal finding of lung field: Secondary | ICD-10-CM | POA: Diagnosis not present

## 2017-04-17 ENCOUNTER — Ambulatory Visit (INDEPENDENT_AMBULATORY_CARE_PROVIDER_SITE_OTHER): Payer: Medicare Other | Admitting: Internal Medicine

## 2017-04-17 ENCOUNTER — Encounter: Payer: Self-pay | Admitting: Internal Medicine

## 2017-04-17 VITALS — BP 128/82 | HR 72 | Temp 98.5°F | Ht 66.0 in | Wt 187.8 lb

## 2017-04-17 VITALS — BP 130/72 | HR 84 | Resp 16 | Ht 67.5 in | Wt 187.0 lb

## 2017-04-17 DIAGNOSIS — K219 Gastro-esophageal reflux disease without esophagitis: Secondary | ICD-10-CM

## 2017-04-17 DIAGNOSIS — R079 Chest pain, unspecified: Secondary | ICD-10-CM

## 2017-04-17 DIAGNOSIS — M1990 Unspecified osteoarthritis, unspecified site: Secondary | ICD-10-CM

## 2017-04-17 DIAGNOSIS — Z0000 Encounter for general adult medical examination without abnormal findings: Secondary | ICD-10-CM | POA: Diagnosis not present

## 2017-04-17 DIAGNOSIS — I10 Essential (primary) hypertension: Secondary | ICD-10-CM | POA: Diagnosis not present

## 2017-04-17 DIAGNOSIS — J4520 Mild intermittent asthma, uncomplicated: Secondary | ICD-10-CM

## 2017-04-17 DIAGNOSIS — M810 Age-related osteoporosis without current pathological fracture: Secondary | ICD-10-CM

## 2017-04-17 DIAGNOSIS — E7800 Pure hypercholesterolemia, unspecified: Secondary | ICD-10-CM

## 2017-04-17 DIAGNOSIS — J45909 Unspecified asthma, uncomplicated: Secondary | ICD-10-CM | POA: Insufficient documentation

## 2017-04-17 LAB — CBC
HEMATOCRIT: 37.4 % (ref 36.0–46.0)
Hemoglobin: 12.3 g/dL (ref 12.0–15.0)
MCHC: 32.9 g/dL (ref 30.0–36.0)
MCV: 86 fl (ref 78.0–100.0)
PLATELETS: 257 10*3/uL (ref 150.0–400.0)
RBC: 4.34 Mil/uL (ref 3.87–5.11)
RDW: 13.5 % (ref 11.5–15.5)
WBC: 5.8 10*3/uL (ref 4.0–10.5)

## 2017-04-17 LAB — LIPID PANEL
CHOLESTEROL: 145 mg/dL (ref 0–200)
HDL: 58 mg/dL (ref >39.00)
LDL Cholesterol: 68 mg/dL (ref 0–99)
NonHDL: 87.3
Total CHOL/HDL Ratio: 3
Triglycerides: 96 mg/dL (ref 0.0–149.0)
VLDL: 19.2 mg/dL (ref 0.0–40.0)

## 2017-04-17 LAB — COMPREHENSIVE METABOLIC PANEL
ALBUMIN: 3.9 g/dL (ref 3.5–5.2)
ALK PHOS: 31 U/L — AB (ref 39–117)
ALT: 11 U/L (ref 0–35)
AST: 14 U/L (ref 0–37)
BUN: 13 mg/dL (ref 6–23)
CALCIUM: 9.4 mg/dL (ref 8.4–10.5)
CO2: 30 mEq/L (ref 19–32)
Chloride: 102 mEq/L (ref 96–112)
Creatinine, Ser: 0.86 mg/dL (ref 0.40–1.20)
GFR: 66.21 mL/min (ref >60.00)
Glucose, Bld: 97 mg/dL (ref 70–99)
POTASSIUM: 3.9 meq/L (ref 3.5–5.1)
Sodium: 137 mEq/L (ref 135–145)
TOTAL PROTEIN: 6.2 g/dL (ref 6.0–8.3)
Total Bilirubin: 0.3 mg/dL (ref 0.2–1.2)

## 2017-04-17 LAB — VITAMIN D 25 HYDROXY (VIT D DEFICIENCY, FRACTURES): VITD: 59.68 ng/mL (ref 30.00–100.00)

## 2017-04-17 MED ORDER — PANTOPRAZOLE SODIUM 40 MG PO TBEC: 40.0000 mg | DELAYED_RELEASE_TABLET | Freq: Every day | ORAL | 6 refills | Status: DC

## 2017-04-17 MED ORDER — PANTOPRAZOLE SODIUM 40 MG PO TBEC: 40.0000 mg | DELAYED_RELEASE_TABLET | Freq: Every day | ORAL | 3 refills | Status: DC

## 2017-04-17 NOTE — Assessment & Plan Note (Signed)
Controlled off meds  Will monitor 

## 2017-04-17 NOTE — Patient Instructions (Signed)
Health Maintenance for Postmenopausal Women Menopause is a normal process in which your reproductive ability comes to an end. This process happens gradually over a span of months to years, usually between the ages of 22 and 9. Menopause is complete when you have missed 12 consecutive menstrual periods. It is important to talk with your health care provider about some of the most common conditions that affect postmenopausal women, such as heart disease, cancer, and bone loss (osteoporosis). Adopting a healthy lifestyle and getting preventive care can help to promote your health and wellness. Those actions can also lower your chances of developing some of these common conditions. What should I know about menopause? During menopause, you may experience a number of symptoms, such as:  Moderate-to-severe hot flashes.  Night sweats.  Decrease in sex drive.  Mood swings.  Headaches.  Tiredness.  Irritability.  Memory problems.  Insomnia.  Choosing to treat or not to treat menopausal changes is an individual decision that you make with your health care provider. What should I know about hormone replacement therapy and supplements? Hormone therapy products are effective for treating symptoms that are associated with menopause, such as hot flashes and night sweats. Hormone replacement carries certain risks, especially as you become older. If you are thinking about using estrogen or estrogen with progestin treatments, discuss the benefits and risks with your health care provider. What should I know about heart disease and stroke? Heart disease, heart attack, and stroke become more likely as you age. This may be due, in part, to the hormonal changes that your body experiences during menopause. These can affect how your body processes dietary fats, triglycerides, and cholesterol. Heart attack and stroke are both medical emergencies. There are many things that you can do to help prevent heart disease  and stroke:  Have your blood pressure checked at least every 1-2 years. High blood pressure causes heart disease and increases the risk of stroke.  If you are 53-22 years old, ask your health care provider if you should take aspirin to prevent a heart attack or a stroke.  Do not use any tobacco products, including cigarettes, chewing tobacco, or electronic cigarettes. If you need help quitting, ask your health care provider.  It is important to eat a healthy diet and maintain a healthy weight. ? Be sure to include plenty of vegetables, fruits, low-fat dairy products, and lean protein. ? Avoid eating foods that are high in solid fats, added sugars, or salt (sodium).  Get regular exercise. This is one of the most important things that you can do for your health. ? Try to exercise for at least 150 minutes each week. The type of exercise that you do should increase your heart rate and make you sweat. This is known as moderate-intensity exercise. ? Try to do strengthening exercises at least twice each week. Do these in addition to the moderate-intensity exercise.  Know your numbers.Ask your health care provider to check your cholesterol and your blood glucose. Continue to have your blood tested as directed by your health care provider.  What should I know about cancer screening? There are several types of cancer. Take the following steps to reduce your risk and to catch any cancer development as early as possible. Breast Cancer  Practice breast self-awareness. ? This means understanding how your breasts normally appear and feel. ? It also means doing regular breast self-exams. Let your health care provider know about any changes, no matter how small.  If you are 40  or older, have a clinician do a breast exam (clinical breast exam or CBE) every year. Depending on your age, family history, and medical history, it may be recommended that you also have a yearly breast X-ray (mammogram).  If you  have a family history of breast cancer, talk with your health care provider about genetic screening.  If you are at high risk for breast cancer, talk with your health care provider about having an MRI and a mammogram every year.  Breast cancer (BRCA) gene test is recommended for women who have family members with BRCA-related cancers. Results of the assessment will determine the need for genetic counseling and BRCA1 and for BRCA2 testing. BRCA-related cancers include these types: ? Breast. This occurs in males or females. ? Ovarian. ? Tubal. This may also be called fallopian tube cancer. ? Cancer of the abdominal or pelvic lining (peritoneal cancer). ? Prostate. ? Pancreatic.  Cervical, Uterine, and Ovarian Cancer Your health care provider may recommend that you be screened regularly for cancer of the pelvic organs. These include your ovaries, uterus, and vagina. This screening involves a pelvic exam, which includes checking for microscopic changes to the surface of your cervix (Pap test).  For women ages 21-65, health care providers may recommend a pelvic exam and a Pap test every three years. For women ages 79-65, they may recommend the Pap test and pelvic exam, combined with testing for human papilloma virus (HPV), every five years. Some types of HPV increase your risk of cervical cancer. Testing for HPV may also be done on women of any age who have unclear Pap test results.  Other health care providers may not recommend any screening for nonpregnant women who are considered low risk for pelvic cancer and have no symptoms. Ask your health care provider if a screening pelvic exam is right for you.  If you have had past treatment for cervical cancer or a condition that could lead to cancer, you need Pap tests and screening for cancer for at least 20 years after your treatment. If Pap tests have been discontinued for you, your risk factors (such as having a new sexual partner) need to be  reassessed to determine if you should start having screenings again. Some women have medical problems that increase the chance of getting cervical cancer. In these cases, your health care provider may recommend that you have screening and Pap tests more often.  If you have a family history of uterine cancer or ovarian cancer, talk with your health care provider about genetic screening.  If you have vaginal bleeding after reaching menopause, tell your health care provider.  There are currently no reliable tests available to screen for ovarian cancer.  Lung Cancer Lung cancer screening is recommended for adults 69-62 years old who are at high risk for lung cancer because of a history of smoking. A yearly low-dose CT scan of the lungs is recommended if you:  Currently smoke.  Have a history of at least 30 pack-years of smoking and you currently smoke or have quit within the past 15 years. A pack-year is smoking an average of one pack of cigarettes per day for one year.  Yearly screening should:  Continue until it has been 15 years since you quit.  Stop if you develop a health problem that would prevent you from having lung cancer treatment.  Colorectal Cancer  This type of cancer can be detected and can often be prevented.  Routine colorectal cancer screening usually begins at  age 42 and continues through age 45.  If you have risk factors for colon cancer, your health care provider may recommend that you be screened at an earlier age.  If you have a family history of colorectal cancer, talk with your health care provider about genetic screening.  Your health care provider may also recommend using home test kits to check for hidden blood in your stool.  A small camera at the end of a tube can be used to examine your colon directly (sigmoidoscopy or colonoscopy). This is done to check for the earliest forms of colorectal cancer.  Direct examination of the colon should be repeated every  5-10 years until age 71. However, if early forms of precancerous polyps or small growths are found or if you have a family history or genetic risk for colorectal cancer, you may need to be screened more often.  Skin Cancer  Check your skin from head to toe regularly.  Monitor any moles. Be sure to tell your health care provider: ? About any new moles or changes in moles, especially if there is a change in a mole's shape or color. ? If you have a mole that is larger than the size of a pencil eraser.  If any of your family members has a history of skin cancer, especially at a young age, talk with your health care provider about genetic screening.  Always use sunscreen. Apply sunscreen liberally and repeatedly throughout the day.  Whenever you are outside, protect yourself by wearing long sleeves, pants, a wide-brimmed hat, and sunglasses.  What should I know about osteoporosis? Osteoporosis is a condition in which bone destruction happens more quickly than new bone creation. After menopause, you may be at an increased risk for osteoporosis. To help prevent osteoporosis or the bone fractures that can happen because of osteoporosis, the following is recommended:  If you are 46-71 years old, get at least 1,000 mg of calcium and at least 600 mg of vitamin D per day.  If you are older than age 55 but younger than age 65, get at least 1,200 mg of calcium and at least 600 mg of vitamin D per day.  If you are older than age 54, get at least 1,200 mg of calcium and at least 800 mg of vitamin D per day.  Smoking and excessive alcohol intake increase the risk of osteoporosis. Eat foods that are rich in calcium and vitamin D, and do weight-bearing exercises several times each week as directed by your health care provider. What should I know about how menopause affects my mental health? Depression may occur at any age, but it is more common as you become older. Common symptoms of depression  include:  Low or sad mood.  Changes in sleep patterns.  Changes in appetite or eating patterns.  Feeling an overall lack of motivation or enjoyment of activities that you previously enjoyed.  Frequent crying spells.  Talk with your health care provider if you think that you are experiencing depression. What should I know about immunizations? It is important that you get and maintain your immunizations. These include:  Tetanus, diphtheria, and pertussis (Tdap) booster vaccine.  Influenza every year before the flu season begins.  Pneumonia vaccine.  Shingles vaccine.  Your health care provider may also recommend other immunizations. This information is not intended to replace advice given to you by your health care provider. Make sure you discuss any questions you have with your health care provider. Document Released: 11/03/2005  Document Revised: 03/31/2016 Document Reviewed: 06/15/2015 [employer] Interactive Patient Education  2018 [employer]  

## 2017-04-17 NOTE — Assessment & Plan Note (Signed)
Just started on Protonix We will give her some time to see how this works

## 2017-04-17 NOTE — Progress Notes (Signed)
HPI:  Pt presents to the clinic today for her Medicare Wellness Exam. She is also due for follow up of chronic conditions.  Seasonal Allergies: Occur all year long. She takes Xyzal, Flonase and Pataday.  Arthritis: Mainly in her hands, wrist and back. She takes Tylenol and Gabapentin with good relief. She follows with Dr. Glo Herring at [facility].  Asthma: Mild persistent. Her symptoms are controlled with Advair daily. She follows with Dr. Mortimer Fries, note from 04/17/17 reviewed.  GERD: She was just started on Protonix today by Dr. Mortimer Fries. He felt like her reflux is uncontrolled, making her asthma worse.  HTN: Her BP today is 128/82. She does not take any antihypertensive medications. ECG from 02/2017 reviewed.  HLD: Her last LDL was 76, 09/2016. She denies myalgias on Simvastatin. She consumes a low fat diet.   Osteoporosis: Her last BMD was 04/2016. She is taking Fosamax as prescribed. She denies recent falls.  Past Medical History:  Diagnosis Date  . Allergy   . Arthritis   . Asthma   . Basal cell carcinoma of skin   . Chicken pox   . GERD (gastroesophageal reflux disease)   . History of kidney stones   . Pulmonary embolism on left [facility])     Current Outpatient Prescriptions  Medication Sig Dispense Refill  . acetaminophen (TYLENOL) 500 MG tablet Take 500 mg by mouth as needed.    Marland Kitchen albuterol (PROVENTIL HFA;VENTOLIN HFA) 108 (90 Base) MCG/ACT inhaler Inhale 2 puffs into the lungs every 4 (four) hours as needed for wheezing or shortness of breath. 54 g 3  . alendronate (FOSAMAX) 70 MG tablet TAKE 1 TABLET EVERY 7 DAYS WITH A FULL GLASS OF WATER ON AN EMPTY STOMACH 12 tablet 1  . Alum Hydroxide-Mag Carbonate (GAVISCON EXTRA STRENGTH) 160-105 MG CHEW Chew 2 tablets by mouth as needed.    Marland Kitchen aspirin 81 MG tablet Take 81 mg by mouth daily.    . budesonide-formoterol (SYMBICORT) 160-4.5 MCG/ACT inhaler Inhale 2 puffs into the lungs 2 (two) times daily. 3 Inhaler 3  . docusate sodium (COLACE) 100 MG capsule  Take 100 mg by mouth 2 (two) times daily as needed for mild constipation.    . Fish Oil-Cholecalciferol (OMEGA-3 FISH OIL-VITAMIN D3) 1200-1000 MG-UNIT CAPS Take 1 capsule by mouth daily.    Marland Kitchen FLAXSEED, LINSEED, PO Take by mouth.    Cristy Friedlander HFA 110 MCG/ACT inhaler USE 2 INHALATIONS TWICE A DAY (Patient not taking: Reported on 03/12/2017) 36 g 3  . fluticasone-salmeterol (ADVAIR HFA) 115-21 MCG/ACT inhaler Inhale 2 puffs into the lungs 2 (two) times daily. 3 Inhaler 3  . fluticasone-salmeterol (ADVAIR HFA) 115-21 MCG/ACT inhaler Inhale 2 puffs into the lungs 2 (two) times daily.    Marland Kitchen gabapentin (NEURONTIN) 300 MG capsule Take 300 mg by mouth 2 (two) times daily.     . Ginger, Zingiber officinalis, (GINGER EXTRACT) 250 MG CAPS Take 250 mg by mouth 2 (two) times daily.    Donnie Aho (GLUCOSAMINE MSM COMPLEX) TABS Take 1 tablet by mouth 2 (two) times daily.    Marland Kitchen levocetirizine (XYZAL) 5 MG tablet Take 1 tablet (5 mg total) by mouth every evening. 30 tablet 0  . Misc Natural Products (BLACK CHERRY CONCENTRATE) LIQD Take by mouth.    . Multiple Vitamins-Minerals (ONE DAILY MULTIVITAMIN WOMEN PO) Take 1 tablet by mouth daily.    . Olopatadine HCl (PATADAY) 0.2 % SOLN INSTILL 1 DROP TO EYE DAILY 7.5 mL 1  . Polyethylene Glycol 3350 GRAN  17 g by Does not apply route as needed.    . ranitidine (ZANTAC) 150 MG tablet Take 1 tablet (150 mg total) by mouth 2 (two) times daily. 60 tablet 1  . simvastatin (ZOCOR) 10 MG tablet TAKE 1 TABLET DAILY 90 tablet 0  . TURMERIC PO Take 300 mg by mouth daily.    . Wheat Dextrin (BENEFIBER) POWD Take 4 g by mouth as needed.     No current facility-administered medications for this visit.     Allergies  Allergen Reactions  . Tramadol Other (See Comments)    Pt states she had a syncopal episode and confusion with Elev BP.     Family History  Problem Relation Age of Onset  . Heart disease Father   . Stroke Maternal Uncle   . Heart disease  Maternal Aunt   . Cancer Neg Hx   . Diabetes Neg Hx     Social History   Social History  . Marital status: Widowed    Spouse name: N/A  . Number of children: N/A  . Years of education: N/A   Occupational History  . Not on file.   Social History Main Topics  . Smoking status: Never Smoker  . Smokeless tobacco: Never Used  . Alcohol use 0.0 oz/week     Comment: rare--wine  . Drug use: No  . Sexual activity: Not Currently   Other Topics Concern  . Not on file   Social History Narrative  . No narrative on file    Hospitiliaztions: None  Health Maintenance:    Flu: 05/2016  Tetanus: 10/2009  Pneumovax: 11/2015  Prevnar: 03/2013  Zostavax: never  Mammogram: no longer screening  Pap Smear: no longer screening  Bone Density: 04/2016  Colon Screening: no longer screening  Eye Doctor: annually  Dental Exam: biannually   Providers:   PCP: Webb Silversmith, NP-C  Pulmonologist: Dr. Mortimer Fries   I have personally reviewed and have noted:  1. The patient's medical and social history 2. Their use of alcohol, tobacco or illicit drugs 3. Their current medications and supplements 4. The patient's functional ability including ADL's, fall risks, home safety risks and hearing or visual impairment. 5. Diet and physical activities 6. Evidence for depression or mood disorder  Subjective:   Review of Systems:   Constitutional: Pt reports fatigue. Denies fever, malaise, headache or abrupt weight changes.  HEENT: Denies eye pain, eye redness, ear pain, ringing in the ears, wax buildup, runny nose, nasal congestion, bloody nose, or sore throat. Respiratory: Pt reports intermittent shortness of breath. Denies difficulty breathing, cough or sputum production.   Cardiovascular: Denies chest pain, chest tightness, palpitations or swelling in the hands or feet.  Gastrointestinal: Pt reports reflux and constipation. Denies abdominal pain, bloating, diarrhea or blood in the stool.  GU: Denies  urgency, frequency, pain with urination, burning sensation, blood in urine, odor or discharge. Musculoskeletal: Pt reports joint pain. Denies decrease in range of motion, difficulty with gait, muscle pain or joint swelling.  Skin: Denies redness, rashes, lesions or ulcercations.  Neurological: Denies dizziness, difficulty with memory, difficulty with speech or problems with balance and coordination.  Psych: Denies anxiety, depression, SI/HI.  No other specific complaints in a complete review of systems (except as listed in HPI above).  Objective:  PE:   BP 128/82   Pulse 72   Temp 98.5 F (36.9 C) (Oral)   Ht 5\' 6"  (1.676 m)   Wt 187 lb 12 oz (85.2 kg)  SpO2 97%   BMI 30.30 kg/m   Wt Readings from Last 3 Encounters:  03/12/17 188 lb 1.9 oz (85.3 kg)  01/05/17 198 lb (89.8 kg)  10/16/16 194 lb (88 kg)    General: Appears her stated age, in NAD. Skin: Warm, dry and intact. HEENT: Head: normal shape and size; Eyes: sclera white, no icterus, conjunctiva pink, PERRLA and EOMs intact; Ears: Tm's gray and intact, normal light reflex; Throat/Mouth: Teeth present, mucosa pink and moist, no exudate, lesions or ulcerations noted.  Neck: Neck supple, trachea midline. No masses, lumps or thyromegaly present.  Cardiovascular: Normal rate and rhythm. S1,S2 noted.  No murmur, rubs or gallops noted. No JVD or BLE edema. No carotid bruits noted. Pulmonary/Chest: Normal effort and positive vesicular breath sounds. No respiratory distress. No wheezes, rales or ronchi noted.  Abdomen: Soft and nontender. Normal bowel sounds. No distention or masses noted.  Musculoskeletal: Gait slow but steady with the use of the cane.  Neurological: Alert and oriented.  Psychiatric: Mood and affect normal. Behavior is normal. Judgment and thought content normal.     BMET    Component Value Date/Time   NA 136 03/12/2017 1154   K 4.4 03/12/2017 1154   CL 102 03/12/2017 1154   CO2 28 03/12/2017 1154    GLUCOSE 95 03/12/2017 1154   BUN 22 03/12/2017 1154   CREATININE 0.96 03/12/2017 1154   CALCIUM 9.7 03/12/2017 1154    Lipid Panel     Component Value Date/Time   CHOL 161 10/16/2016 1048   TRIG 104.0 10/16/2016 1048   HDL 64.60 10/16/2016 1048   CHOLHDL 2 10/16/2016 1048   VLDL 20.8 10/16/2016 1048   LDLCALC 76 10/16/2016 1048    CBC    Component Value Date/Time   WBC 5.0 03/12/2017 1154   RBC 4.58 03/12/2017 1154   HGB 12.9 03/12/2017 1154   HCT 39.5 03/12/2017 1154   PLT 228.0 03/12/2017 1154   MCV 86.2 03/12/2017 1154   MCHC 32.8 03/12/2017 1154   RDW 13.9 03/12/2017 1154    Hgb A1C No results found for: HGBA1C    Assessment and Plan:   Medicare Annual Wellness Visit:  Diet: She does eat lean meat. She consumes more veggies than fruits. She does not eat fried food. She drinks mostly water, some soda. Physical activity: She goes to silver sneakers 3 x week Depression/mood screen: Negative Hearing: Intact to whispered voice Visual acuity: Grossly normal, performs annual eye exam  ADLs: Capable Fall risk: None Home safety: Good Cognitive evaluation: Intact to orientation, naming, recall and repetition EOL planning: Adv directives, full code/ I agree  Preventative Medicine: Encouraged her to get a flu shot in the fall. Tetanus, pneumovax and prevnar UTD. She declines Zostovax or Shingrix. She no longer wants to do pap smears, mammograms or colon cancer screening. Encouraged her to consume a balanced diet and exercise regimen. Advised her to see an eye doctor and dentist annually. Will check CBC, CMET, Lipid and Vit D today.   Next appointment: 1 year, Medicare Wellness   [place], Rollene Fare, NP

## 2017-04-17 NOTE — Assessment & Plan Note (Signed)
She will continue Fosamax for now BMD due next year

## 2017-04-17 NOTE — Progress Notes (Signed)
Date: 04/17/2017  MRN# [id number] Julie Davies 1927-04-30  Referring Physician: NP Webb Silversmith  Julie Davies is a 83 y.o. old female seen in consultation for asthma and lung nodules  CC:  Follow up ASTHMA  PREVIOUS HISTORY Follow up Patient is a pleasant 83 year old female for evaluation of lung nodules and asthma optimization. Patient has a history of lung nodules. He follow-up pulmonologist in [place], review of records show that she was following his earliest 2014 for 2 small 8 mm lower lobe nodules with mild adenopathy. Nodules are nonspecific in a patient with a history of choking, hiatal hernia and possible aspiration. At that time surveillance was recommended. Patient stated that she had her last CT in March 2016 at all pulmonologist office. Patient is a prolonged history of asthma, smoked for about 10 years quit in the 1950s, had significant secondhand smoking exposure from appearance. For her asthma she is currently on as needed albuterol, and Flovent. Patient states she has a mild intermittent slightly productive cough at times, has not had to use albuterol rescue inhaler in a few months. However she did state that prior to walking she will take 2 puffs of albuterol. 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 Patient states she has mild dizziness today but this is a chronic problem, she lives with her daughter, she can walk up and down stairs with mild dyspnea. Review of records showed she had one asthma exacerbation in 2015 that was treated with antibiotics and steroids. As for her pulmonary nodules based on her CAT scan from 2016 patient states she has 2 new nodules for a total of 4 nodules. At today's visit there is no CD with CT scans to compare previous lung windows. Allergies: Symptoms controlled on Xyzal, Flonase and Pataday.  HPI Patient presents today for  follow-up visit of her pulmonary nodules in her ASTHMA Patient states she has intermittent wheezing and coughing episodes with increased work of breathing and shortness of breath It seems that her reflux is not under control and she only takes H2 blockers for this Patient states she has intermittent fluttering in her chest along with some intermittent chest pain that radiates to her back  Patient uses Advair daily She has no signs of infection at this time Mild exacerbation due to uncontrolled reflux  I have reviewed CT Chest 12/2016 with patient there is new RUL GGO, all other nodules are stable and no growth I have reviewed CT chest 04/13/17 with patient the right upper lobe groundglass opacity has completely resolved and all the other subcentimeter nodules have been stable since 2016   Medication:    Current Outpatient Prescriptions:  .  acetaminophen (TYLENOL) 500 MG tablet, Take 500 mg by mouth as needed., Disp: , Rfl:  .  albuterol (PROVENTIL HFA;VENTOLIN HFA) 108 (90 Base) MCG/ACT inhaler, Inhale 2 puffs into the lungs every 4 (four) hours as needed for wheezing or shortness of breath., Disp: 54 g, Rfl: 3 .  alendronate (FOSAMAX) 70 MG tablet, TAKE 1 TABLET EVERY 7 DAYS WITH A FULL GLASS OF WATER ON AN EMPTY STOMACH, Disp: 12 tablet, Rfl: 1 .  Alum Hydroxide-Mag Carbonate (GAVISCON EXTRA STRENGTH) 160-105 MG CHEW, Chew 2 tablets by mouth as needed., Disp: , Rfl:  .  aspirin 81 MG tablet, Take 81 mg by mouth daily., Disp: , Rfl:  .  budesonide-formoterol (SYMBICORT) 160-4.5 MCG/ACT inhaler, Inhale 2 puffs into the lungs  2 (two) times daily., Disp: 3 Inhaler, Rfl: 3 .  docusate sodium (COLACE) 100 MG capsule, Take 100 mg by mouth 2 (two) times daily as needed for mild constipation., Disp: , Rfl:  .  Fish Oil-Cholecalciferol (OMEGA-3 FISH OIL-VITAMIN D3) 1200-1000 MG-UNIT CAPS, Take 1 capsule by mouth daily., Disp: , Rfl:  .  FLAXSEED, LINSEED, PO, Take by mouth., Disp: , Rfl:  .  FLOVENT  HFA 110 MCG/ACT inhaler, USE 2 INHALATIONS TWICE A DAY (Patient not taking: Reported on 03/12/2017), Disp: 36 g, Rfl: 3 .  fluticasone-salmeterol (ADVAIR HFA) 115-21 MCG/ACT inhaler, Inhale 2 puffs into the lungs 2 (two) times daily., Disp: 3 Inhaler, Rfl: 3 .  fluticasone-salmeterol (ADVAIR HFA) 115-21 MCG/ACT inhaler, Inhale 2 puffs into the lungs 2 (two) times daily., Disp: , Rfl:  .  gabapentin (NEURONTIN) 300 MG capsule, Take 300 mg by mouth 2 (two) times daily. , Disp: , Rfl:  .  Ginger, Zingiber officinalis, (GINGER EXTRACT) 250 MG CAPS, Take 250 mg by mouth 2 (two) times daily., Disp: , Rfl:  .  Glucos-MSM-C-Mn-Ginger-Willow (GLUCOSAMINE MSM COMPLEX) TABS, Take 1 tablet by mouth 2 (two) times daily., Disp: , Rfl:  .  levocetirizine (XYZAL) 5 MG tablet, Take 1 tablet (5 mg total) by mouth every evening., Disp: 30 tablet, Rfl: 0 .  Misc Natural Products (BLACK CHERRY CONCENTRATE) LIQD, Take by mouth., Disp: , Rfl:  .  Multiple Vitamins-Minerals (ONE DAILY MULTIVITAMIN WOMEN PO), Take 1 tablet by mouth daily., Disp: , Rfl:  .  Olopatadine HCl (PATADAY) 0.2 % SOLN, INSTILL 1 DROP TO EYE DAILY, Disp: 7.5 mL, Rfl: 1 .  Polyethylene Glycol 3350 GRAN, 17 g by Does not apply route as needed., Disp: , Rfl:  .  ranitidine (ZANTAC) 150 MG tablet, Take 1 tablet (150 mg total) by mouth 2 (two) times daily., Disp: 60 tablet, Rfl: 1 .  simvastatin (ZOCOR) 10 MG tablet, TAKE 1 TABLET DAILY, Disp: 90 tablet, Rfl: 0 .  TURMERIC PO, Take 300 mg by mouth daily., Disp: , Rfl:  .  Wheat Dextrin (BENEFIBER) POWD, Take 4 g by mouth as needed., Disp: , Rfl:      Allergies:  Tramadol  Review of Systems: Gen:  Denies  fever, sweats, chills HEENT: Denies blurred vision, double vision, ear pain, eye pain, hearing loss, nose bleeds, sore throat Cvc:  No dizziness, chest pain or heaviness Resp:   Mild intermittent cough, mostly non productive, shortness of breath Other:  All other systems negative  Physical  Examination:   Resp 16   Ht 5' 7.5" (1.715 m)   Wt 187 lb (84.8 kg)   BMI 28.86 kg/m   General Appearance: No distress  Neuro:without focal findings, mental status, speech normal, alert and oriented, cranial nerves 2-12 intact, reflexes normal and symmetric, sensation grossly normal  HEENT: PERRLA, EOM intact, no ptosis, no other lesions noticed; Mallampati 2 Pulmonary: normal breath sounds., diaphragmatic excursion normal.No wheezing, No rales;   Sputum Production:  none CardiovascularNormal S1,S2.  No m/r/g.  Abdominal aorta pulsation normal.    Extremities: Anterior right leg with small incision, clean mild erythema around the site but no significant drainage.   CT Chest 04/02/15 No findings to suggest interstitial lung disease, 8 x 10 mm microlobulated and slightly spiculated nodule in the posterior aspect of the right upper lobe, concerning for potential primary bronchogenic neoplasm. Alternatively, given the patient's history of recent pneumonia, this could represent an area of post infectious or inflammatory scarring. There is an  additional area of architectural distortion and slightly cephalad to this lesion within the right upper lobe which is also unusual, and favored to represent an area of scarring also. Mild diffuse bronchial wall thickening with mild centrilobular emphysema Three-vessel coronary artery disease  Pulmonary function testing 06/06/2013 FVC 98% FEV110% FEV1/FVC 79% RV 110 TLC 97 DLCO 73   Pulmonary Function Test 10/18/15  FEV1 80% FEV1/FVC 75% FVC 79%  RV 71% TLC 65% RV/TLC 106% ERV 24% DLCO 75%.  Impression: no significant obstruction or response to bronchodilators. Mild to moderate restriction noted. Severe reduction in ERV ( usually seen in abdominal obesity ), clinical correlation advised.   6 minute walk test - heart heart rate 107, total distance 994 feet /303 m , no somatic and desaturations, patient use cane during test.   CT chest 03/2017 1.  Resolution of right upper lobe ground-glass nodule. 2. Stable numerous small bilateral pulmonary nodules since 2016. These are considered benign. 3. No new pulmonary lesions or acute pulmonary findings. 4. No mediastinal or hilar mass or lymphadenopathy. 5. Large hiatal hernia.   Assessment and Plan: 83 year old female follow up  for asthma-mild intermittent in the setting of large hiatal hernia with gastrointestinal reflux disease with bilateral subcentimeter benign pulmonary nodules in the setting of deconditioned state with ongoing shortness of breath and dyspnea on exertion with intermittent chest fluttering and chest pain  #1 chest pain and intermittent fluttering Will obtain echocardiogram and refer to cardiology  #2 mild intermittent asthma with mild exacerbation likely due to uncontrolled reflux Patient advised to start Protonix 40 mg daily  #3With h/o  pulmonary nodules Pulmonary nodules-previous work up of nodules Records obtained from previous pulmonologist Dr.Sever C Surdulescu [facility], Ph [phone]) Apparently patient had a bronchitis/pneumonia earlier in the year (2014), discussed follow-up with a PET CT, PET CT showed possible new lesions or new pulmonary nodules (did not get a copy of this PET/CT), a repeat CT was done followed up in July 2016 that showed 8 x 63mm enlarging right upper lobe nodule along with a 1.4 x 1.7 cm right upper lobe nodule, also. Given that she had a recent infection, these were felt to be reactive or inflammatory but malignancy cannot be completely excluded. Follow-up CT 01/17/2016 showed stable right thyroid nodule, stable right upper lobe 66mm nodule, no new nodules noted. Given findings of recent CT, there is no further workup needed at this time besides surveillance. We did discuss possible bronchoscopy of the right upper lobe dominant nodule which is 8 mm, however at this time will risk factors for lung malignancy a low, and the  character 6 of the right upper lobe nodule does not show any spiculation or Sunburst pattern, or any concerns for malignancy. Patient deferred bronchoscopy at this time.  Current CT scan of chest without contrast shows resolution of right upper lobe groundglass opacities but has persistent bilateral subcentimeter  pulmonary nodules that have been present since 2016 which are benign in nature   #4 deconditioned state Patient advised exercises tolerated   Patient satisfied with Plan of action and management. All questions answered Follow-up after echocardiogram completed and cardiology referral completed  Follow-up in 3 months Jaxyn Rout Patricia Pesa, M.D.  [facility]  Medical Director [place] Director [facility] Cardio-P[facility] Department

## 2017-04-17 NOTE — Assessment & Plan Note (Signed)
CMET and lipid profile today Consume a low fat diet Continue Simvastatin

## 2017-04-17 NOTE — Patient Instructions (Signed)
Asthma intermittent under control with Advair Albuterol as needed Will refer to cardiology for chest pain Start Protonix

## 2017-04-17 NOTE — Assessment & Plan Note (Signed)
Continue Advair Continue to follow with pulmonology

## 2017-04-17 NOTE — Assessment & Plan Note (Signed)
She will continue Tylenol and Gabapentin She will continue to follow with Dr. Clemetine Marker

## 2017-04-30 ENCOUNTER — Ambulatory Visit (INDEPENDENT_AMBULATORY_CARE_PROVIDER_SITE_OTHER): Payer: Medicare Other

## 2017-04-30 ENCOUNTER — Other Ambulatory Visit: Payer: Self-pay

## 2017-04-30 DIAGNOSIS — R079 Chest pain, unspecified: Secondary | ICD-10-CM

## 2017-05-16 ENCOUNTER — Emergency Department: Payer: Medicare Other

## 2017-05-16 ENCOUNTER — Emergency Department
Admission: EM | Admit: 2017-05-16 | Discharge: 2017-05-16 | Disposition: A | Discharge status: Home / Self Care | Payer: Medicare Other | Attending: Emergency Medicine | Admitting: Emergency Medicine

## 2017-05-16 ENCOUNTER — Encounter: Payer: Self-pay | Admitting: Emergency Medicine

## 2017-05-16 DIAGNOSIS — R55 Syncope and collapse: Secondary | ICD-10-CM | POA: Insufficient documentation

## 2017-05-16 DIAGNOSIS — Z79899 Other long term (current) drug therapy: Secondary | ICD-10-CM | POA: Diagnosis not present

## 2017-05-16 DIAGNOSIS — R0602 Shortness of breath: Secondary | ICD-10-CM | POA: Diagnosis not present

## 2017-05-16 DIAGNOSIS — R0789 Other chest pain: Secondary | ICD-10-CM | POA: Diagnosis not present

## 2017-05-16 DIAGNOSIS — I10 Essential (primary) hypertension: Secondary | ICD-10-CM | POA: Diagnosis not present

## 2017-05-16 DIAGNOSIS — J45909 Unspecified asthma, uncomplicated: Secondary | ICD-10-CM | POA: Diagnosis not present

## 2017-05-16 DIAGNOSIS — J449 Chronic obstructive pulmonary disease, unspecified: Secondary | ICD-10-CM | POA: Insufficient documentation

## 2017-05-16 DIAGNOSIS — Z96653 Presence of artificial knee joint, bilateral: Secondary | ICD-10-CM | POA: Diagnosis not present

## 2017-05-16 DIAGNOSIS — Z7982 Long term (current) use of aspirin: Secondary | ICD-10-CM | POA: Diagnosis not present

## 2017-05-16 DIAGNOSIS — R42 Dizziness and giddiness: Secondary | ICD-10-CM | POA: Diagnosis not present

## 2017-05-16 LAB — CBC
HCT: 38 % (ref 35.0–47.0)
Hemoglobin: 13.2 g/dL (ref 12.0–16.0)
MCH: 29.3 pg (ref 26.0–34.0)
MCHC: 34.8 g/dL (ref 32.0–36.0)
MCV: 84.1 fL (ref 80.0–100.0)
PLATELETS: 240 10*3/uL (ref 150–440)
RBC: 4.51 MIL/uL (ref 3.80–5.20)
RDW: 14 % (ref 11.5–14.5)
WBC: 4.7 10*3/uL (ref 3.6–11.0)

## 2017-05-16 LAB — TROPONIN I: Troponin I: <0.03 ng/mL (ref <0.03)

## 2017-05-16 LAB — BASIC METABOLIC PANEL
Anion gap: 8 (ref 5–15)
BUN: 20 mg/dL (ref 6–20)
CALCIUM: 9.1 mg/dL (ref 8.9–10.3)
CHLORIDE: 104 mmol/L (ref 101–111)
CO2: 25 mmol/L (ref 22–32)
CREATININE: 0.91 mg/dL (ref 0.44–1.00)
GFR, EST NON AFRICAN AMERICAN: 55 mL/min — AB (ref >60)
Glucose, Bld: 127 mg/dL — ABNORMAL HIGH (ref 65–99)
Potassium: 4 mmol/L (ref 3.5–5.1)
SODIUM: 137 mmol/L (ref 135–145)

## 2017-05-16 NOTE — ED Provider Notes (Signed)
[facility] Emergency Department Provider Note  ____________________________________________   First MD Initiated Contact with Patient 05/16/17 1154     (approximate)  I have reviewed the triage vital signs and the nursing notes.   HISTORY  Chief Complaint Near Syncope    HPI Julie Davies is a 83 y.o. female who presents by EMS for evaluation of near syncope, dizziness, shortness of breath, and some centralized chest pressure.  She has had these sorts of symptoms in the past, particularly the near syncope and dizziness, and has had extensive evaluations and been generally unremarkable.  She is also a cardiac workups in the past that have not identified any particular diagnosis.  She sees a pulmonologist and a note from her primary care provider 2 months ago indicates that she was evaluated for similar symptoms and referred back to the pulmonologist to see if some pulmonary nodules and her COPD could be the result of her intermittent unsteadiness and syncopal episodes.  She reports that today she was having a typical day, having had breakfast and then went to the senior center.  She went to lunch at [facility] with a friend and while she was there she started to feel a great deal of epigastric burning discomfort similar to prior heartburn and was having some burping.  She felt very hot and flushed and was sweating and felt some "tightness" in her arms and legs.  She felt a little bit lightheaded like she might pass out but it was mild at the time.  She went home and then felt like she was not going to be able to walk in the house safely and was assisted to the ground so that she did not fall down or pass out.  She reports having some central chest pressure but thinks it may have been associated with the acid reflux to which she is accustomed.  She had some mild shortness of breath as well.  Her symptoms are severe at their worst.  Nothing in particular makes the patient's  symptoms better nor worse.  She currently is asymptomatic and feels back to her baseline.  She does state that these episodes were similar to prior episodes she has had in the past.  She has no personal history of cardiac disease.  She does have mild COPD and sees Dr. Mortimer Fries as an outpatient.  She denies recent fever/chills, nausea, vomiting, dysuria.   Past Medical History:  Diagnosis Date  . Allergy   . Arthritis   . Asthma   . Basal cell carcinoma of skin   . Chicken pox   . GERD (gastroesophageal reflux disease)   . History of kidney stones   . Pulmonary embolism on left [facility])     Patient Active Problem List   Diagnosis Date Noted  . Asthma 04/17/2017  . Age-related osteoporosis without current pathological fracture 10/16/2016  . COPD type A ([place]) 04/19/2015  . Pulmonary nodules 03/12/2015  . Essential hypertension 09/10/2014  . HLD (hyperlipidemia) 09/10/2014  . Gastroesophageal reflux disease without esophagitis 09/10/2014  . Arthritis 09/10/2014    Past Surgical History:  Procedure Laterality Date  . APPENDECTOMY  1935  . DILATION AND CURETTAGE OF UTERUS  1975  . NEPHRECTOMY Right 1975  . REPLACEMENT TOTAL KNEE BILATERAL  2013  . VEIN LIGATION AND STRIPPING Left 1971    Prior to Admission medications   Medication Sig Start Date End Date Taking? Authorizing Provider  acetaminophen (TYLENOL) 500 MG tablet Take 500 mg by mouth  as needed.    [provider]  albuterol (PROVENTIL HFA;VENTOLIN HFA) 108 (90 Base) MCG/ACT inhaler Inhale 2 puffs into the lungs every 4 (four) hours as needed for wheezing or shortness of breath. 10/20/16   Jearld Fenton, NP  alendronate (FOSAMAX) 70 MG tablet TAKE 1 TABLET EVERY 7 DAYS WITH A FULL GLASS OF WATER ON AN EMPTY STOMACH 10/20/16   Jearld Fenton, NP  Alum Hydroxide-Mag Carbonate (GAVISCON EXTRA STRENGTH) 160-105 MG CHEW Chew 2 tablets by mouth as needed.    [provider]  aspirin 81 MG tablet Take 81 mg by mouth  daily.    [provider]  docusate sodium (COLACE) 100 MG capsule Take 100 mg by mouth 2 (two) times daily as needed for mild constipation.    [provider]  Fish Oil-Cholecalciferol (OMEGA-3 FISH OIL-VITAMIN D3) 1200-1000 MG-UNIT CAPS Take 1 capsule by mouth daily.    [provider]  fluticasone-salmeterol (ADVAIR HFA) 115-21 MCG/ACT inhaler Inhale 2 puffs into the lungs 2 (two) times daily. 02/27/17   Flora Lipps, MD  gabapentin (NEURONTIN) 300 MG capsule Take 300 mg by mouth 2 (two) times daily.  06/10/15   [provider]  Ginger, Zingiber officinalis, (GINGER EXTRACT) 250 MG CAPS Take 250 mg by mouth 2 (two) times daily.    [provider]  Glucos-MSM-C-Mn-Ginger-Willow (GLUCOSAMINE MSM COMPLEX) TABS Take 1 tablet by mouth 2 (two) times daily.    [provider]  levocetirizine (XYZAL) 5 MG tablet Take 1 tablet (5 mg total) by mouth every evening. 09/10/14   Jearld Fenton, NP  Misc Natural Products (TART CHERRY ADVANCED) CAPS Take 2 capsules by mouth daily.    [provider]  Multiple Vitamins-Minerals (ONE DAILY MULTIVITAMIN WOMEN PO) Take 1 tablet by mouth daily.    [provider]  Olopatadine HCl (PATADAY) 0.2 % SOLN INSTILL 1 DROP TO EYE DAILY 10/20/16   Jearld Fenton, NP  pantoprazole (PROTONIX) 40 MG tablet Take 1 tablet (40 mg total) by mouth daily. 04/17/17 04/17/18  Flora Lipps, MD  Polyethylene Glycol 3350 GRAN 17 g by Does not apply route as needed.    [provider]  simvastatin (ZOCOR) 10 MG tablet TAKE 1 TABLET DAILY 02/20/17   Jearld Fenton, NP  TURMERIC PO Take 300 mg by mouth daily.    [provider]  Wheat Dextrin (BENEFIBER) POWD Take 4 g by mouth as needed.    [provider]    Allergies Tramadol  Family History  Problem Relation Age of Onset  . Heart disease Father   . Stroke Maternal Uncle   . Heart disease Maternal Aunt   . Cancer Neg Hx   . Diabetes Neg  Hx     Social History Social History  Substance Use Topics  . Smoking status: Never Smoker  . Smokeless tobacco: Never Used  . Alcohol use 0.0 oz/week     Comment: rare--wine    Review of Systems Constitutional: No fever/chills Eyes: No visual changes. ENT: No sore throat. Cardiovascular: Central chest pressure.  Near syncope/generalized weakness Respiratory: Mild shortness of breath. Gastrointestinal: Burning epigastric pain, now resolved.  No nausea, no vomiting.  No diarrhea.  No constipation. Genitourinary: Negative for dysuria. Musculoskeletal: Negative for neck pain.  Negative for back pain. Integumentary: Negative for rash. Neurological: Generalized weakness, near syncope and feeling flushed, but Negative for headaches, focal weakness and numbness.   ____________________________________________   PHYSICAL EXAM:  VITAL SIGNS: ED Triage  Vitals  Enc Vitals Group     BP 05/16/17 1143 113/86     Pulse Rate 05/16/17 1143 80     Resp 05/16/17 1143 18     Temp 05/16/17 1143 97.7 F (36.5 C)     Temp Source 05/16/17 1143 Oral     SpO2 05/16/17 1143 97 %     Weight 05/16/17 1144 86.2 kg (190 lb)     Height 05/16/17 1144 1.702 m (5\' 7" )     Head Circumference --      Peak Flow --      Pain Score 05/16/17 1143 5     Pain Loc --      Pain Edu? --      Excl. in [place]? --     Constitutional: Alert and oriented. Well appearing and in no acute distress. Eyes: Conjunctivae are normal. PERRL. EOMI. Head: Atraumatic. Nose: No congestion/rhinnorhea. Mouth/Throat: Mucous membranes are dry. Neck: No stridor.  No meningeal signs.   Cardiovascular: Normal rate, regular rhythm. Good peripheral circulation. Grossly normal heart sounds. Respiratory: Normal respiratory effort.  No retractions. Lungs CTAB. Gastrointestinal: Soft and nontender. No distention.  Musculoskeletal: No lower extremity tenderness nor edema. No gross deformities of extremities. Neurologic:  Normal speech and  language. No gross focal neurologic deficits are appreciated.  Skin:  Skin is warm, dry and intact. No rash noted. Psychiatric: Mood and affect are normal. Speech and behavior are normal.  ____________________________________________   LABS (all labs ordered are listed, but only abnormal results are displayed)  Labs Reviewed  BASIC METABOLIC PANEL - Abnormal; Notable for the following:       Result Value   Glucose, Bld 127 (*)    GFR calc non Af Amer 55 (*)    All other components within normal limits  CBC  TROPONIN I  TROPONIN I   ____________________________________________  EKG  ED ECG REPORT I, Cohen Boettner, the attending physician, personally viewed and interpreted this ECG.  Date: 05/16/2017 EKG Time: 11:46 Rate: 81 Rhythm: sinus rhyhm with LAFB QRS Axis: normal Intervals: normal ST/T Wave abnormalities: Non-specific ST segment / T-wave changes, but no evidence of acute ischemia. Narrative Interpretation: no evidence of acute ischemia, no changes since 02/2017 or the prior ECG on record from 2014  ____________________________________________  RADIOLOGY   Dg Chest 2 View  Result Date: 05/16/2017 CLINICAL DATA:  Near syncope.  Shortness of breath. EXAM: CHEST  2 VIEW COMPARISON:  Chest CT April 12, 2017 and chest radiograph October 26, 2015. FINDINGS: There is no edema or consolidation. Heart is upper normal in size with pulmonary vascularity within normal limits. No adenopathy. There is degenerative change in the thoracic spine. There is a small hiatal hernia. IMPRESSION: No edema or consolidation.  Small hiatal hernia. Electronically Signed   By: Lowella Grip III M.D.   On: 05/16/2017 12:35    ____________________________________________   PROCEDURES  Critical Care performed: No   Procedure(s) performed:   Procedures   ____________________________________________   INITIAL IMPRESSION / ASSESSMENT AND PLAN / ED COURSE  Pertinent labs & imaging  results that were available during my care of the patient were reviewed by me and considered in my medical decision making (see chart for details).  The patient has had similar issues in the past with reassuring workup.  She does appear volume depleted with dry mucous membranes today and I will provide a small fluid bolus and reassess.  Her constellation of symptoms sounds vasovagal but also may represent ACS.  Given her symptoms of chest pressure and shortness of breath we will also evaluate from a cardiac standpoint but at this moment she is in no acute distress and not requiring an emergent intervention.  Patient family are comfortable with the plan for broad evaluation, IV rehydration, and reassessment.   Clinical Course as of May 16 1553  Wed May 16, 2017  1200 OBSERVATION CARE: This patient is being placed under observation care for the following reasons: Chest pain with repeat testing to rule out ischemia.  [CF]  1303 No acute abnormalities identified on the chest x-ray DG Chest 2 View [CF]  1349 Patient is stable and asymptomatic.  No additional chest pain, shortness of breath, flushing/vagovagal symptoms, or near syncope.  We are continuing with the plan.  [CF]  2620 Second troponin is negative.  I will reassess the patient to make sure she is still asymptomatic, but if so I think she is appropriate for outpatient follow-up as per my original plan. Troponin I: <0.03 [CF]  1551 The patient is asymptomatic at this time and has been feeling much better after she ate.  She and her daughter are both comfortable with the plan to go home.    I gave my usual and customary return precautions.     [CF]    Clinical Course User Index [CF] Hinda Kehr, MD    ____________________________________________  FINAL CLINICAL IMPRESSION(S) / ED DIAGNOSES  Final diagnoses:  Near syncope  Atypical chest pain     MEDICATIONS GIVEN DURING THIS VISIT:  Medications - No data to display   NEW  OUTPATIENT MEDICATIONS STARTED DURING THIS VISIT:  New Prescriptions   No medications on file    Modified Medications   No medications on file    Discontinued Medications   No medications on file     Note:  This document was prepared using Dragon voice recognition software and may include unintentional dictation errors.    Hinda Kehr, MD 05/16/17 [phone]

## 2017-05-16 NOTE — Discharge Instructions (Signed)
You have been seen today in the Emergency Department (ED)  for near-syncope (almost passing out).  Your workup including labs and EKG show reassuring results.  Your symptoms may be due to mild dehydration, so it is important that you drink plenty of non-alcoholic fluids.    Please call your regular doctor as soon as possible to schedule the next available clinic appointment to follow up with him/her regarding your visit to the ED and your symptoms.  Return to the Emergency Department (ED)  if you have any further syncopal episodes (pass out again) or develop any chest pain, pressure, tightness, trouble breathing, sudden sweating, or other symptoms that concern you.

## 2017-05-16 NOTE — ED Notes (Addendum)
Informed pt of second troponin due at 3p. Pt verbalized understanding.   Pt given [place] sandwich tray and water, ok per Dr. Karma Greaser.

## 2017-05-16 NOTE — ED Notes (Signed)
Pt in xray

## 2017-05-16 NOTE — ED Triage Notes (Signed)
Pt in via [facility] from home pt with complaints of near syncopal episode this morning, feeling dizzy, short of breath, with centralized chest pressure.  Pt with hx of same with unremarkable cardiac work up.  Vitals WDL.  NAD noted at this time.

## 2017-05-18 ENCOUNTER — Ambulatory Visit (INDEPENDENT_AMBULATORY_CARE_PROVIDER_SITE_OTHER): Payer: Medicare Other | Admitting: Internal Medicine

## 2017-05-18 ENCOUNTER — Encounter: Payer: Self-pay | Admitting: Internal Medicine

## 2017-05-18 VITALS — BP 126/72 | HR 83 | Temp 97.7°F | Wt 189.0 lb

## 2017-05-18 DIAGNOSIS — R42 Dizziness and giddiness: Secondary | ICD-10-CM

## 2017-05-18 DIAGNOSIS — R140 Abdominal distension (gaseous): Secondary | ICD-10-CM | POA: Diagnosis not present

## 2017-05-18 DIAGNOSIS — R0789 Other chest pain: Secondary | ICD-10-CM | POA: Diagnosis not present

## 2017-05-18 DIAGNOSIS — K5900 Constipation, unspecified: Secondary | ICD-10-CM | POA: Diagnosis not present

## 2017-05-18 DIAGNOSIS — R1013 Epigastric pain: Secondary | ICD-10-CM

## 2017-05-18 DIAGNOSIS — R110 Nausea: Secondary | ICD-10-CM | POA: Diagnosis not present

## 2017-05-18 DIAGNOSIS — R0602 Shortness of breath: Secondary | ICD-10-CM | POA: Diagnosis not present

## 2017-05-18 DIAGNOSIS — R55 Syncope and collapse: Secondary | ICD-10-CM

## 2017-05-18 LAB — COMPREHENSIVE METABOLIC PANEL
ALBUMIN: 3.9 g/dL (ref 3.5–5.2)
ALK PHOS: 31 U/L — AB (ref 39–117)
ALT: 16 U/L (ref 0–35)
AST: 17 U/L (ref 0–37)
BUN: 19 mg/dL (ref 6–23)
CHLORIDE: 104 meq/L (ref 96–112)
CO2: 31 mEq/L (ref 19–32)
CREATININE: 0.96 mg/dL (ref 0.40–1.20)
Calcium: 9.5 mg/dL (ref 8.4–10.5)
GFR: 58.3 mL/min — ABNORMAL LOW (ref >60.00)
GLUCOSE: 117 mg/dL — AB (ref 70–99)
POTASSIUM: 4.2 meq/L (ref 3.5–5.1)
SODIUM: 139 meq/L (ref 135–145)
Total Bilirubin: 0.3 mg/dL (ref 0.2–1.2)
Total Protein: 6.5 g/dL (ref 6.0–8.3)

## 2017-05-18 LAB — H. PYLORI ANTIBODY, IGG: H Pylori IgG: NEGATIVE

## 2017-05-18 NOTE — Patient Instructions (Signed)
Heartburn Heartburn is a type of pain or discomfort that can happen in the throat or chest. It is often described as a burning pain. It may also cause a bad taste in the mouth. Heartburn may feel worse when you lie down or bend over. It may be caused by stomach contents that move back up (reflux) into the tube that connects the mouth with the stomach (esophagus). Follow these instructions at home: Take these actions to lessen your discomfort and to help avoid problems. Diet  Follow a diet as told by your doctor. You may need to avoid foods and drinks such as: ? Coffee and tea (with or without caffeine). ? Drinks that contain alcohol. ? Energy drinks and sports drinks. ? Carbonated drinks or sodas. ? Chocolate and cocoa. ? Peppermint and mint flavorings. ? Garlic and onions. ? Horseradish. ? Spicy and acidic foods, such as peppers, chili powder, curry powder, vinegar, hot sauces, and BBQ sauce. ? Citrus fruit juices and citrus fruits, such as oranges, lemons, and limes. ? Tomato-based foods, such as red sauce, chili, salsa, and pizza with red sauce. ? Fried and fatty foods, such as donuts, french fries, potato chips, and high-fat dressings. ? High-fat meats, such as hot dogs, rib eye steak, sausage, ham, and bacon. ? High-fat dairy items, such as whole milk, butter, and cream cheese.  Eat small meals often. Avoid eating large meals.  Avoid drinking large amounts of liquid with your meals.  Avoid eating meals during the 2-3 hours before bedtime.  Avoid lying down right after you eat.  Do not exercise right after you eat. General instructions  Pay attention to any changes in your symptoms.  Take over-the-counter and prescription medicines only as told by your doctor. Do not take aspirin, ibuprofen, or other NSAIDs unless your doctor says it is okay.  Do not use any tobacco products, including cigarettes, chewing tobacco, and e-cigarettes. If you need help quitting, ask your  doctor.  Wear loose clothes. Do not wear anything tight around your waist.  Raise (elevate) the head of your bed about 6 inches (15 cm).  Try to lower your stress. If you need help doing this, ask your doctor.  If you are overweight, lose an amount of weight that is healthy for you. Ask your doctor about a safe weight loss goal.  Keep all follow-up visits as told by your doctor. This is important. Contact a doctor if:  You have new symptoms.  You lose weight and you do not know why it is happening.  You have trouble swallowing, or it hurts to swallow.  You have wheezing or a cough that keeps happening.  Your symptoms do not get better with treatment.  You have heartburn often for more than two weeks. Get help right away if:  You have pain in your arms, neck, jaw, teeth, or back.  You feel sweaty, dizzy, or light-headed.  You have chest pain or shortness of breath.  You throw up (vomit) and your throw up looks like blood or coffee grounds.  Your poop (stool) is bloody or black. This information is not intended to replace advice given to you by your health care provider. Make sure you discuss any questions you have with your health care provider. Document Released: 05/24/2011 Document Revised: 02/17/2016 Document Reviewed: 01/06/2015 Elsevier Interactive Patient Education  2018 Elsevier Inc.  

## 2017-05-18 NOTE — Progress Notes (Signed)
Subjective:    Patient ID: Julie Davies, female    DOB: Mar 17, 1927, 83 y.o.   MRN: [id number]  HPI  Pt presents to the clinic today for ER follow up. She went to the ER 8/22 via EMS with c/p chest pressure, SOB, dizziness and near syncope. ECG was unchanged from prior. Chest xray was negative. Troponin's were negative. She was felt to be mildly dehydrated. She was give some oral fluids, discharged and advised to follow up with her PCP. She has had similar episodes in the past, has had a full negative cardiac and pulmonary workup. She reports she has an appt with cardiology on Tuesday to review her Echo results. She has had mild dizziness, but her biggest concern is nausea, epigastric pain, bloating and constipation. She takes Mirilax intermittently with good relief. She has started back on her Gaviscon but has not noticed that it has helped much. She feels like her GI issues are the biggest problem right now. She denies any near syncopal events since leaving the ER.   Review of Systems      Past Medical History:  Diagnosis Date  . Allergy   . Arthritis   . Asthma   . Basal cell carcinoma of skin   . Chicken pox   . GERD (gastroesophageal reflux disease)   . History of kidney stones   . Pulmonary embolism on left [facility])     Current Outpatient Prescriptions  Medication Sig Dispense Refill  . acetaminophen (TYLENOL) 500 MG tablet Take 500 mg by mouth as needed.    Marland Kitchen albuterol (PROVENTIL HFA;VENTOLIN HFA) 108 (90 Base) MCG/ACT inhaler Inhale 2 puffs into the lungs every 4 (four) hours as needed for wheezing or shortness of breath. 54 g 3  . alendronate (FOSAMAX) 70 MG tablet TAKE 1 TABLET EVERY 7 DAYS WITH A FULL GLASS OF WATER ON AN EMPTY STOMACH 12 tablet 1  . Alum Hydroxide-Mag Carbonate (GAVISCON EXTRA STRENGTH) 160-105 MG CHEW Chew 2 tablets by mouth as needed.    Marland Kitchen aspirin 81 MG tablet Take 81 mg by mouth daily.    Marland Kitchen docusate sodium (COLACE) 100 MG capsule Take 100 mg by mouth 2  (two) times daily as needed for mild constipation.    . Fish Oil-Cholecalciferol (OMEGA-3 FISH OIL-VITAMIN D3) 1200-1000 MG-UNIT CAPS Take 1 capsule by mouth daily.    . fluticasone-salmeterol (ADVAIR HFA) 115-21 MCG/ACT inhaler Inhale 2 puffs into the lungs 2 (two) times daily. 3 Inhaler 3  . gabapentin (NEURONTIN) 300 MG capsule Take 300 mg by mouth 2 (two) times daily.     . Ginger, Zingiber officinalis, (GINGER EXTRACT) 250 MG CAPS Take 250 mg by mouth 2 (two) times daily.    Donnie Aho (GLUCOSAMINE MSM COMPLEX) TABS Take 1 tablet by mouth 2 (two) times daily.    Marland Kitchen levocetirizine (XYZAL) 5 MG tablet Take 1 tablet (5 mg total) by mouth every evening. 30 tablet 0  . Misc Natural Products (TART CHERRY ADVANCED) CAPS Take 2 capsules by mouth daily.    . Multiple Vitamins-Minerals (ONE DAILY MULTIVITAMIN WOMEN PO) Take 1 tablet by mouth daily.    . Olopatadine HCl (PATADAY) 0.2 % SOLN INSTILL 1 DROP TO EYE DAILY 7.5 mL 1  . pantoprazole (PROTONIX) 40 MG tablet Take 1 tablet (40 mg total) by mouth daily. 90 tablet 3  . Polyethylene Glycol 3350 GRAN 17 g by Does not apply route as needed.    . simvastatin (ZOCOR) 10 MG tablet TAKE 1 TABLET  DAILY 90 tablet 0  . TURMERIC PO Take 300 mg by mouth daily.    . Wheat Dextrin (BENEFIBER) POWD Take 4 g by mouth as needed.     No current facility-administered medications for this visit.     Allergies  Allergen Reactions  . Tramadol Other (See Comments)    Pt states she had a syncopal episode and confusion with Elev BP.     Family History  Problem Relation Age of Onset  . Heart disease Father   . Stroke Maternal Uncle   . Heart disease Maternal Aunt   . Cancer Neg Hx   . Diabetes Neg Hx     Social History   Social History  . Marital status: Widowed    Spouse name: N/A  . Number of children: N/A  . Years of education: N/A   Occupational History  . Not on file.   Social History Main Topics  . Smoking status: Never  Smoker  . Smokeless tobacco: Never Used  . Alcohol use 0.0 oz/week     Comment: rare--wine  . Drug use: No  . Sexual activity: Not Currently   Other Topics Concern  . Not on file   Social History Narrative  . No narrative on file     Constitutional: Denies fever, malaise, fatigue, headache or abrupt weight changes.  Respiratory: Pt reports dyspnea on exertion. Denies difficulty breathing, cough or sputum production.   Cardiovascular: Denies chest pain, chest tightness, palpitations or swelling in the hands or feet.  Gastrointestinal: Pt reports epigastric pain, bloating, nausea and constipation. Denies diarrhea or blood in the stool.  Musculoskeletal: Denies decrease in range of motion, difficulty with gait, muscle pain or joint pain and swelling.   Neurological: Pt reports dizziness. Denies difficulty with memory, difficulty with speech or problems with balance and coordination.    No other specific complaints in a complete review of systems (except as listed in HPI above).  Objective:   Physical Exam   BP 126/72   Pulse 83   Temp 97.7 F (36.5 C) (Oral)   Wt 189 lb (85.7 kg)   SpO2 97%   BMI 29.60 kg/m  Wt Readings from Last 3 Encounters:  05/18/17 189 lb (85.7 kg)  05/16/17 190 lb (86.2 kg)  04/17/17 187 lb 12 oz (85.2 kg)    General: Appears her stated age,in NAD. Cardiovascular: Normal rate and rhythm. S1,S2 noted.  No murmur, rubs or gallops noted.  Pulmonary/Chest: Normal effort and positive vesicular breath sounds. No respiratory distress. No wheezes, rales or ronchi noted.  Abdomen: Soft and tender in the epigastric region. Normal bowel sounds. No distention or masses noted.  Musculoskeletal: Gait slow but steady with the use of a cane. Neurological: Alert and oriented.    BMET    Component Value Date/Time   NA 137 05/16/2017 1142   K 4.0 05/16/2017 1142   CL 104 05/16/2017 1142   CO2 25 05/16/2017 1142   GLUCOSE 127 (H) 05/16/2017 1142   BUN 20  05/16/2017 1142   CREATININE 0.91 05/16/2017 1142   CALCIUM 9.1 05/16/2017 1142   GFRNONAA 55 (L) 05/16/2017 1142   GFRAA >60 05/16/2017 1142    Lipid Panel     Component Value Date/Time   CHOL 145 04/17/2017 1505   TRIG 96.0 04/17/2017 1505   HDL 58.00 04/17/2017 1505   CHOLHDL 3 04/17/2017 1505   VLDL 19.2 04/17/2017 1505   LDLCALC 68 04/17/2017 1505    CBC  Component Value Date/Time   WBC 4.7 05/16/2017 1142   RBC 4.51 05/16/2017 1142   HGB 13.2 05/16/2017 1142   HCT 38.0 05/16/2017 1142   PLT 240 05/16/2017 1142   MCV 84.1 05/16/2017 1142   MCH 29.3 05/16/2017 1142   MCHC 34.8 05/16/2017 1142   RDW 14.0 05/16/2017 1142    Hgb A1C No results found for: HGBA1C         Assessment & Plan:   ER Follow Up for Chest Pressure, Shortness of Breath, Dizziness and Near Syncope:  ER notes, labs and imaging reviewed I do not think this is cardiac in nature No medication changes at this time She will follow up with cardiology next week  Epigastric Pain, Nausea, Bloating and Constipation:  Encouraged her to drink plenty of fluid Start Mirilax daily in am Start Senna 1 tab in pm  She declines RX for antiemetic Will check CBC, CMET and H Pylori today  Return precautions discussed BAITY, REGINA, NP  Return precautions discussed Webb Silversmith, NP

## 2017-05-20 ENCOUNTER — Other Ambulatory Visit: Payer: Self-pay | Admitting: Internal Medicine

## 2017-05-20 NOTE — Progress Notes (Signed)
Cardiology Office Note  Date:  05/22/2017   ID:  Julie Davies, DOB 1927-04-18, MRN [id number]  PCP:  Jearld Fenton, NP   Chief Complaint  Patient presents with  . other    NEw patient. Referred by Dr. Mortimer Fries for chest pain. Patient c/o bring dizzy with chest pain.  Meds reviewed verbally with patient.     HPI:  Julie Davies is a 83 y.o. female  Mild COPD/asthma PE on left  HTN Resenting by referral from Dr. Mortimer Fries for chest pain, shortness of breath  She reports worsening symptoms of chest pain shortness of breath for the past year Sometimes at rest, sometimes with exertion Sometimes symptoms resolved with H2 blockers Recently started on PPI  Worsening shortness of breath recently Noticed when she is walking She reports strong family history, mother and father both with MIs  on8/22/18 presented by EMS for evaluation of near syncope, dizziness, shortness of breath, and some centralized chest pressure.    symptoms in the past, particularly the near syncope and dizziness, and has had extensive evaluations and been generally unremarkable.   Participates at the senior center.   Recently episode of chest pain while she was at lunch at [facility]   epigastric burning discomfort similar to prior heartburn and was having some burping.  She felt very hot and flushed and was sweating and felt some "tightness" in her arms and legs.  little bit lightheaded like she might pass out but it was mild at the time.  felt like she was not going to be able to walk in the house safely and was assisted to the ground so that she did not fall down or pass out.    She reports having some central chest pressure   EKG personally reviewed by myself on todays visit  shows normal sinus rhythm with rate 76/m left axis deviationNo significant ST or T-wave changes  Orthostatics done, blood pressure down from 145 supine down to 121 with standing heart rate up from 74-85   PMH:   has a past medical  history of Allergy; Arthritis; Asthma; Basal cell carcinoma of skin; Chicken pox; GERD (gastroesophageal reflux disease); History of kidney stones; and Pulmonary embolism on left [facility]).  PSH:    Past Surgical History:  Procedure Laterality Date  . APPENDECTOMY  1935  . DILATION AND CURETTAGE OF UTERUS  1975  . NEPHRECTOMY Right 1975  . REPLACEMENT TOTAL KNEE BILATERAL  2013  . VEIN LIGATION AND STRIPPING Left 1971    Current Outpatient Prescriptions  Medication Sig Dispense Refill  . acetaminophen (TYLENOL) 500 MG tablet Take 500 mg by mouth as needed.    Marland Kitchen albuterol (PROVENTIL HFA;VENTOLIN HFA) 108 (90 Base) MCG/ACT inhaler Inhale 2 puffs into the lungs every 4 (four) hours as needed for wheezing or shortness of breath. 54 g 3  . alendronate (FOSAMAX) 70 MG tablet TAKE 1 TABLET EVERY 7 DAYS WITH A FULL GLASS OF WATER ON AN EMPTY STOMACH 12 tablet 1  . Alum Hydroxide-Mag Carbonate (GAVISCON EXTRA STRENGTH) 160-105 MG CHEW Chew 2 tablets by mouth as needed.    Marland Kitchen aspirin 81 MG tablet Take 81 mg by mouth daily.    Marland Kitchen docusate sodium (COLACE) 100 MG capsule Take 100 mg by mouth 2 (two) times daily as needed for mild constipation.    . Fish Oil-Cholecalciferol (OMEGA-3 FISH OIL-VITAMIN D3) 1200-1000 MG-UNIT CAPS Take 1 capsule by mouth daily.    . fluticasone-salmeterol (ADVAIR HFA) 115-21 MCG/ACT inhaler Inhale 2 puffs  into the lungs 2 (two) times daily. 3 Inhaler 3  . gabapentin (NEURONTIN) 300 MG capsule Take 300 mg by mouth 2 (two) times daily.     . Ginger, Zingiber officinalis, (GINGER EXTRACT) 250 MG CAPS Take 250 mg by mouth 2 (two) times daily.    Donnie Aho (GLUCOSAMINE MSM COMPLEX) TABS Take 1 tablet by mouth 2 (two) times daily.    Marland Kitchen levocetirizine (XYZAL) 5 MG tablet Take 1 tablet (5 mg total) by mouth every evening. 30 tablet 0  . Misc Natural Products (TART CHERRY ADVANCED) CAPS Take 2 capsules by mouth daily.    . Multiple Vitamins-Minerals (ONE DAILY  MULTIVITAMIN WOMEN PO) Take 1 tablet by mouth daily.    . Olopatadine HCl (PATADAY) 0.2 % SOLN INSTILL 1 DROP TO EYE DAILY 7.5 mL 1  . pantoprazole (PROTONIX) 40 MG tablet Take 1 tablet (40 mg total) by mouth daily. 90 tablet 3  . Polyethylene Glycol 3350 GRAN 17 g by Does not apply route as needed.    . simvastatin (ZOCOR) 20 MG tablet Take 1 tablet (20 mg total) by mouth daily. 90 tablet 3  . TURMERIC PO Take 300 mg by mouth daily.    . Wheat Dextrin (BENEFIBER) POWD Take 4 g by mouth as needed.     No current facility-administered medications for this visit.      Allergies:   Tramadol   Social History:  The patient  reports that she has never smoked. She has never used smokeless tobacco. She reports that she drinks alcohol. She reports that she does not use drugs.   Family History:   family history includes Heart disease in her father and maternal aunt; Stroke in her maternal uncle.    Review of Systems: Review of Systems  Constitutional: Negative.   Respiratory: Negative.   Cardiovascular: Positive for chest pain.  Gastrointestinal: Positive for heartburn.  Musculoskeletal: Negative.   Neurological: Negative.   Psychiatric/Behavioral: Negative.   All other systems reviewed and are negative.    PHYSICAL EXAM: VS:  BP 132/80 (BP Location: Left Arm, Patient Position: Sitting, Cuff Size: Normal)   Pulse 76   Ht 5\' 7"  (1.702 m)   Wt 186 lb 12 oz (84.7 kg)   BMI 29.25 kg/m  , BMI Body mass index is 29.25 kg/m. GEN: Well nourished, well developed, in no acute distress  HEENT: normal  Neck: no JVD, carotid bruits, or masses Cardiac: RRR; no murmurs, rubs, or gallops,no edema  Respiratory:  clear to auscultation bilaterally, normal work of breathing GI: soft, nontender, nondistended, + BS MS: no deformity or atrophy  Skin: warm and dry, no rash Neuro:  Strength and sensation are intact Psych: euthymic mood, full affect    Recent Labs: 03/12/2017: TSH 1.20 05/18/2017:  ALT 16; BUN 19; Creatinine, Ser 0.96; Hemoglobin 12.7; Platelets 238.0; Potassium 4.2; Sodium 139    Lipid Panel Lab Results  Component Value Date   CHOL 145 04/17/2017   HDL 58.00 04/17/2017   LDLCALC 68 04/17/2017   TRIG 96.0 04/17/2017      Wt Readings from Last 3 Encounters:  05/22/17 186 lb 12 oz (84.7 kg)  05/18/17 189 lb (85.7 kg)  05/16/17 190 lb (86.2 kg)       ASSESSMENT AND PLAN:  Essential hypertension - Plan: NM Myocar Multi W/Spect W/Wall Motion / EF Blood pressure is well controlled on today's visit. No changes made to the medications.  Mixed hyperlipidemia - Plan: NM Myocar Multi W/Spect W/Wall Motion /  EF Given significant coronary disease on CT scan chest, recommended she increase Zocor to 20 mg daily  Gastroesophageal reflux disease without esophagitis - Managed with PPI, H2 blocker  Centrilobular emphysema (HCC) - Plan: NM Myocar Multi W/Spect W/Wall Motion / EF Followed by pulmonary, on inhalers  Chest pain, unspecified type Some typical as well as atypical features As below, recommended stress test Recent normal echocardiogram ejection fraction greater than 55%  Coronary artery disease of native artery of native heart with stable angina pectoris ([place]) Significant coronary disease noticed on chest CT scan She does have some chest pain, shortness of breath on exertion Strong family history of MI, Mother and father Unable to treadmill E have scheduled her for a pharmacologic Myoview  Disposition:   F/U  6 months   Total encounter time more than 45 minutes  Greater than 50% was spent in counseling and coordination of care with the patient    Orders Placed This Encounter  Procedures  . NM Myocar Multi W/Spect W/Wall Motion / EF  . EKG 12-Lead     Signed, Esmond Plants, M.D., Ph.D. 05/22/2017  [place], [place]

## 2017-05-21 ENCOUNTER — Other Ambulatory Visit: Payer: Medicare Other

## 2017-05-21 ENCOUNTER — Other Ambulatory Visit: Payer: Medicare Other | Admitting: Internal Medicine

## 2017-05-21 LAB — CBC
HCT: 38.9 % (ref 36.0–46.0)
Hemoglobin: 12.7 g/dL (ref 12.0–15.0)
MCHC: 32.7 g/dL (ref 30.0–36.0)
MCV: 87.5 fl (ref 78.0–100.0)
Platelets: 238 10*3/uL (ref 150.0–400.0)
RBC: 4.45 Mil/uL (ref 3.87–5.11)
RDW: 13.7 % (ref 11.5–15.5)
WBC: 4.1 10*3/uL (ref 4.0–10.5)

## 2017-05-22 ENCOUNTER — Encounter: Payer: Self-pay | Admitting: Cardiovascular Disease

## 2017-05-22 ENCOUNTER — Ambulatory Visit (INDEPENDENT_AMBULATORY_CARE_PROVIDER_SITE_OTHER): Payer: Medicare Other | Admitting: Cardiovascular Disease

## 2017-05-22 VITALS — BP 132/80 | HR 76 | Ht 67.0 in | Wt 186.8 lb

## 2017-05-22 DIAGNOSIS — I25118 Atherosclerotic heart disease of native coronary artery with other forms of angina pectoris: Secondary | ICD-10-CM | POA: Diagnosis not present

## 2017-05-22 DIAGNOSIS — I10 Essential (primary) hypertension: Secondary | ICD-10-CM | POA: Diagnosis not present

## 2017-05-22 DIAGNOSIS — R079 Chest pain, unspecified: Secondary | ICD-10-CM | POA: Diagnosis not present

## 2017-05-22 DIAGNOSIS — J432 Centrilobular emphysema: Secondary | ICD-10-CM

## 2017-05-22 DIAGNOSIS — R55 Syncope and collapse: Secondary | ICD-10-CM

## 2017-05-22 DIAGNOSIS — E782 Mixed hyperlipidemia: Secondary | ICD-10-CM

## 2017-05-22 DIAGNOSIS — K219 Gastro-esophageal reflux disease without esophagitis: Secondary | ICD-10-CM

## 2017-05-22 DIAGNOSIS — I209 Angina pectoris, unspecified: Secondary | ICD-10-CM

## 2017-05-22 MED ORDER — SIMVASTATIN 20 MG PO TABS: 20.0000 mg | ORAL_TABLET | Freq: Every day | ORAL | 3 refills | Status: DC

## 2017-05-22 NOTE — Patient Instructions (Addendum)
Medication Instructions:   Please increase the zocor up to 20 mg daily  Labwork:  No new labs needed  Testing/Procedures:  We will order a lexiscan myoview for chest pain, CAD  Julie Davies  Your caregiver has ordered a Stress Test with nuclear imaging. The purpose of this test is to evaluate the blood supply to your heart muscle. This procedure is referred to as a "Non-Invasive Stress Test." This is because other than having an IV started in your vein, nothing is inserted or "invades" your body. Cardiac stress tests are done to find areas of poor blood flow to the heart by determining the extent of coronary artery disease (CAD). Some patients exercise on a treadmill, which naturally increases the blood flow to your heart, while others who are  unable to walk on a treadmill due to physical limitations have a pharmacologic/chemical stress agent called Lexiscan . This medicine will mimic walking on a treadmill by temporarily increasing your coronary blood flow.   Please note: these test may take anywhere between 2-4 hours to complete  PLEASE REPORT TO [facility] AT THE FIRST DESK WILL DIRECT YOU WHERE TO GO  Date of Procedure:__Monday September 10th____  Arrival Time for Procedure:__Arrive at 07:45AM____    PLEASE NOTIFY THE OFFICE AT LEAST 24 HOURS IN ADVANCE IF YOU ARE UNABLE TO [place].  [phone] AND  PLEASE NOTIFY NUCLEAR MEDICINE AT [facility] AT LEAST 24 HOURS IN ADVANCE IF YOU ARE UNABLE TO KEEP YOUR APPOINTMENT. [phone]  How to prepare for your Myoview test:  1. Do not eat or drink after midnight 2. No caffeine for 24 hours prior to test 3. No smoking 24 hours prior to test. 4. Your medication may be taken with water.  If your doctor stopped a medication because of this test, do not take that medication. 5. Ladies, please do not wear dresses.  Skirts or pants are appropriate. Please wear a short sleeve shirt. 6. No perfume,  cologne or lotion. 7. Wear comfortable walking shoes. No heels!    Follow-Up: It was a pleasure seeing you in the office today. Please call us if you have new issues that need to be addressed before your next appt.  [phone]  Your physician wants you to follow-up in: 6 months.  You will receive a reminder letter in the mail two months in advance. If you don't receive a letter, please call our office to schedule the follow-up appointment.  If you need a refill on your cardiac medications before your next appointment, please call your pharmacy.

## 2017-05-29 DIAGNOSIS — M25551 Pain in right hip: Secondary | ICD-10-CM | POA: Diagnosis not present

## 2017-05-29 DIAGNOSIS — M791 Myalgia: Secondary | ICD-10-CM | POA: Diagnosis not present

## 2017-05-29 DIAGNOSIS — M48061 Spinal stenosis, lumbar region without neurogenic claudication: Secondary | ICD-10-CM | POA: Diagnosis not present

## 2017-05-29 DIAGNOSIS — M5416 Radiculopathy, lumbar region: Secondary | ICD-10-CM | POA: Diagnosis not present

## 2017-05-29 DIAGNOSIS — M47896 Other spondylosis, lumbar region: Secondary | ICD-10-CM | POA: Diagnosis not present

## 2017-06-03 ENCOUNTER — Other Ambulatory Visit: Payer: Self-pay | Admitting: Internal Medicine

## 2017-06-03 DIAGNOSIS — M810 Age-related osteoporosis without current pathological fracture: Secondary | ICD-10-CM

## 2017-06-04 ENCOUNTER — Encounter
Admission: RE | Admit: 2017-06-04 | Discharge: 2017-06-04 | Disposition: A | Discharge status: Home / Self Care | Payer: Medicare Other | Source: Ambulatory Visit | Attending: Cardiovascular Disease | Admitting: Cardiovascular Disease

## 2017-06-04 DIAGNOSIS — I25118 Atherosclerotic heart disease of native coronary artery with other forms of angina pectoris: Secondary | ICD-10-CM | POA: Diagnosis not present

## 2017-06-04 DIAGNOSIS — I10 Essential (primary) hypertension: Secondary | ICD-10-CM | POA: Diagnosis not present

## 2017-06-04 DIAGNOSIS — R079 Chest pain, unspecified: Secondary | ICD-10-CM | POA: Diagnosis not present

## 2017-06-04 DIAGNOSIS — J432 Centrilobular emphysema: Secondary | ICD-10-CM

## 2017-06-04 DIAGNOSIS — R55 Syncope and collapse: Secondary | ICD-10-CM | POA: Diagnosis not present

## 2017-06-04 DIAGNOSIS — E782 Mixed hyperlipidemia: Secondary | ICD-10-CM | POA: Diagnosis not present

## 2017-06-04 DIAGNOSIS — K219 Gastro-esophageal reflux disease without esophagitis: Secondary | ICD-10-CM | POA: Insufficient documentation

## 2017-06-04 LAB — NM MYOCAR MULTI W/SPECT W/WALL MOTION / EF
CHL CUP NUCLEAR SDS: 0
CHL CUP NUCLEAR SRS: 21
CHL CUP NUCLEAR SSS: 11
CHL CUP STRESS STAGE 2 SPEED: 0 mph
CHL CUP STRESS STAGE 3 HR: 71 {beats}/min
CHL CUP STRESS STAGE 3 SPEED: 0 mph
CHL CUP STRESS STAGE 4 HR: 82 {beats}/min
CHL CUP STRESS STAGE 4 SPEED: 0 mph
CHL CUP STRESS STAGE 5 GRADE: 0 %
CSEPEW: 1 METS
CSEPHR: 75 %
CSEPPHR: 82 {beats}/min
CSEPPMHR: 61 %
LV sys vol: 25 mL
LVDIAVOL: 54 mL (ref 46–106)
Rest HR: 71 {beats}/min
Stage 1 Grade: 0 %
Stage 1 HR: 72 {beats}/min
Stage 1 Speed: 0 mph
Stage 2 Grade: 0 %
Stage 2 HR: 71 {beats}/min
Stage 3 Grade: 0 %
Stage 4 Grade: 0 %
Stage 5 DBP: 55 mmHg
Stage 5 HR: 88 {beats}/min
Stage 5 SBP: 103 mmHg
Stage 5 Speed: 0 mph
TID: 0.74

## 2017-06-04 MED ORDER — TECHNETIUM TC 99M TETROFOSMIN IV KIT
29.6300 | PACK | Freq: Once | INTRAVENOUS | Status: AC | PRN
  Administered 2017-06-04: 29.63 via INTRAVENOUS

## 2017-06-04 MED ORDER — REGADENOSON 0.4 MG/5ML IV SOLN
0.4000 mg | Freq: Once | INTRAVENOUS | Status: AC
  Administered 2017-06-04: 0.4 mg via INTRAVENOUS

## 2017-06-04 MED ORDER — TECHNETIUM TC 99M TETROFOSMIN IV KIT
13.0000 | PACK | Freq: Once | INTRAVENOUS | Status: AC | PRN
  Administered 2017-06-04: 13.1 via INTRAVENOUS

## 2017-06-20 ENCOUNTER — Encounter: Payer: Self-pay | Admitting: Internal Medicine

## 2017-06-20 ENCOUNTER — Ambulatory Visit (INDEPENDENT_AMBULATORY_CARE_PROVIDER_SITE_OTHER): Payer: Medicare Other | Admitting: Internal Medicine

## 2017-06-20 VITALS — BP 124/68 | HR 78 | Temp 98.1°F | Wt 193.0 lb

## 2017-06-20 DIAGNOSIS — R0789 Other chest pain: Secondary | ICD-10-CM

## 2017-06-20 DIAGNOSIS — K219 Gastro-esophageal reflux disease without esophagitis: Secondary | ICD-10-CM | POA: Diagnosis not present

## 2017-06-20 DIAGNOSIS — R140 Abdominal distension (gaseous): Secondary | ICD-10-CM

## 2017-06-20 DIAGNOSIS — R0602 Shortness of breath: Secondary | ICD-10-CM | POA: Diagnosis not present

## 2017-06-20 DIAGNOSIS — R42 Dizziness and giddiness: Secondary | ICD-10-CM | POA: Diagnosis not present

## 2017-06-20 DIAGNOSIS — I25118 Atherosclerotic heart disease of native coronary artery with other forms of angina pectoris: Secondary | ICD-10-CM | POA: Diagnosis not present

## 2017-06-20 NOTE — Progress Notes (Signed)
Subjective:    Patient ID: Julie Davies, female    DOB: 1927/05/05, 83 y.o.   MRN: [id number]  HPI  Pt presents to the clinic today to to follow up multiple concerns.  1- She reports persistent dizziness. This has been going on for at least 18 months now. She recently had a near syncopal episode that she was evaluated in the ER for.  03/12/17: She saw Allie Bossier, NP-C. Her dizziness at that time occurred at rest and positionally. She reported associated shortness of breath and chest tightness. She has a history of COPD, on Advair. Anda Kraft advised her to follow up with pulmonology. ECG and orthostatics were negative.  05/16/17: She went to the ER for near syncopal episode. ECG, troponins and chest xray were normal. It was felt that she was slightly dehydrated. She was given IV fluids and advised to follow up with PCP.  8/28: She saw Dr. Rockey Situ, for follow up of near syncopal episodes, chest tightness and shortness of breath. Echo was normal. Stress test performed on 9/10 was normal.   2- She reports persistent reflux. She has been taking Protonix daily as prescribed by Dr. Mortimer Fries on 7/24. She has also tried Gaviscon and Zantac in the past. She had CMET and H Pylori 8/24 which was negative. She does not have a GI doctor.  She reports despite all the workup she has had, she still feels dizzy intermittently. She describes it as a sense that the room is spinning. She reports persist shortness of breath with activity. She reports ongoing reflux and reflux. If she lays down too soon after eating or bends over the reflux is worse. She denies nausea or vomiting. She reports her bowels are moving normally. She has increased her Protonix to BID but reports it has not helped. She has a history of a hiatal hernia.  Review of Systems      Past Medical History:  Diagnosis Date  . Allergy   . Arthritis   . Asthma   . Basal cell carcinoma of skin   . Chicken pox   . GERD (gastroesophageal reflux  disease)   . History of kidney stones   . Pulmonary embolism on left [facility])     Current Outpatient Prescriptions  Medication Sig Dispense Refill  . acetaminophen (TYLENOL) 500 MG tablet Take 500 mg by mouth as needed.    Marland Kitchen albuterol (PROVENTIL HFA;VENTOLIN HFA) 108 (90 Base) MCG/ACT inhaler Inhale 2 puffs into the lungs every 4 (four) hours as needed for wheezing or shortness of breath. 54 g 3  . alendronate (FOSAMAX) 70 MG tablet TAKE 1 TABLET EVERY 7 DAYS WITH A FULL GLASS OF WATER ON AN EMPTY STOMACH 12 tablet 2  . Alum Hydroxide-Mag Carbonate (GAVISCON EXTRA STRENGTH) 160-105 MG CHEW Chew 2 tablets by mouth as needed.    Marland Kitchen aspirin 81 MG tablet Take 81 mg by mouth daily.    Marland Kitchen docusate sodium (COLACE) 100 MG capsule Take 100 mg by mouth 2 (two) times daily as needed for mild constipation.    . Fish Oil-Cholecalciferol (OMEGA-3 FISH OIL-VITAMIN D3) 1200-1000 MG-UNIT CAPS Take 1 capsule by mouth daily.    . fluticasone-salmeterol (ADVAIR HFA) 115-21 MCG/ACT inhaler Inhale 2 puffs into the lungs 2 (two) times daily. 3 Inhaler 3  . gabapentin (NEURONTIN) 300 MG capsule Take 300 mg by mouth 2 (two) times daily.     . Ginger, Zingiber officinalis, (GINGER EXTRACT) 250 MG CAPS Take 250 mg by mouth 2 (two) times  daily.    . Glucos-MSM-C-Mn-Ginger-Willow (GLUCOSAMINE MSM COMPLEX) TABS Take 1 tablet by mouth 2 (two) times daily.    Marland Kitchen levocetirizine (XYZAL) 5 MG tablet Take 1 tablet (5 mg total) by mouth every evening. 30 tablet 0  . Misc Natural Products (TART CHERRY ADVANCED) CAPS Take 2 capsules by mouth daily.    . Multiple Vitamins-Minerals (ONE DAILY MULTIVITAMIN WOMEN PO) Take 1 tablet by mouth daily.    . Olopatadine HCl (PATADAY) 0.2 % SOLN INSTILL 1 DROP TO EYE DAILY 7.5 mL 1  . pantoprazole (PROTONIX) 40 MG tablet Take 1 tablet (40 mg total) by mouth daily. 90 tablet 3  . Polyethylene Glycol 3350 GRAN 17 g by Does not apply route as needed.    . simvastatin (ZOCOR) 20 MG tablet Take 1 tablet  (20 mg total) by mouth daily. 90 tablet 3  . TURMERIC PO Take 300 mg by mouth daily.    . Wheat Dextrin (BENEFIBER) POWD Take 4 g by mouth as needed.     No current facility-administered medications for this visit.     Allergies  Allergen Reactions  . Tramadol Other (See Comments)    Pt states she had a syncopal episode and confusion with Elev BP.     Family History  Problem Relation Age of Onset  . Heart disease Father   . Stroke Maternal Uncle   . Heart disease Maternal Aunt   . Cancer Neg Hx   . Diabetes Neg Hx     Social History   Social History  . Marital status: Widowed    Spouse name: N/A  . Number of children: N/A  . Years of education: N/A   Occupational History  . Not on file.   Social History Main Topics  . Smoking status: Never Smoker  . Smokeless tobacco: Never Used  . Alcohol use 0.0 oz/week     Comment: rare--wine  . Drug use: No  . Sexual activity: Not Currently   Other Topics Concern  . Not on file   Social History Narrative  . No narrative on file     Constitutional: Denies fever, malaise, fatigue, headache or abrupt weight changes.  Respiratory: Pt reports shortness of breath. Denies difficulty breathing, cough or sputum production.   Cardiovascular: Pt reports intermittent chest pain. Denies chest tightness, palpitations or swelling in the hands or feet.  Gastrointestinal: Pt reports reflux. Denies abdominal pain, bloating, constipation, diarrhea or blood in the stool.  Neurological: Pt reports dizziness. Denies dizziness, difficulty with memory, difficulty with speech or problems with balance and coordination.    No other specific complaints in a complete review of systems (except as listed in HPI above).  Objective:   Physical Exam  BP 124/68   Pulse 78   Temp 98.1 F (36.7 C) (Oral)   Wt 193 lb (87.5 kg)   SpO2 96%   BMI 30.23 kg/m  Wt Readings from Last 3 Encounters:  06/20/17 193 lb (87.5 kg)  05/22/17 186 lb 12 oz (84.7  kg)  05/18/17 189 lb (85.7 kg)    General: Appears her stated age, in NAD. Cardiovascular: Normal rate and rhythm. S1,S2 noted.  No murmur, rubs or gallops noted. No JVD or BLE edema. No carotid bruits noted. Pulmonary/Chest: Normal effort and positive vesicular breath sounds. No respiratory distress. No wheezes, rales or ronchi noted.  Abdomen: Soft and nontender. Normal bowel sounds. No distention or masses noted.  Musculoskeletal: Gait slow but steady.  Neurological: Alert and oriented. Coordination  normal.    BMET    Component Value Date/Time   NA 139 05/18/2017 1432   K 4.2 05/18/2017 1432   CL 104 05/18/2017 1432   CO2 31 05/18/2017 1432   GLUCOSE 117 (H) 05/18/2017 1432   BUN 19 05/18/2017 1432   CREATININE 0.96 05/18/2017 1432   CALCIUM 9.5 05/18/2017 1432   GFRNONAA 55 (L) 05/16/2017 1142   GFRAA >60 05/16/2017 1142    Lipid Panel     Component Value Date/Time   CHOL 145 04/17/2017 1505   TRIG 96.0 04/17/2017 1505   HDL 58.00 04/17/2017 1505   CHOLHDL 3 04/17/2017 1505   VLDL 19.2 04/17/2017 1505   LDLCALC 68 04/17/2017 1505    CBC    Component Value Date/Time   WBC 4.1 05/18/2017 1432   RBC 4.45 05/18/2017 1432   HGB 12.7 05/18/2017 1432   HCT 38.9 05/18/2017 1432   PLT 238.0 05/18/2017 1432   MCV 87.5 05/18/2017 1432   MCH 29.3 05/16/2017 1142   MCHC 32.7 05/18/2017 1432   RDW 13.7 05/18/2017 1432    Hgb A1C No results found for: HGBA1C          Assessment & Plan:   Dizziness:  She is staying well hydrated and eating on a fairly regular schedule Referral placed to PT for Vestibular Rehab  GERD, Gassiness:  She has failed Zantac, and Protonix At this point, I think referral to GI is appropriate as this uncontrolled reflux could be causing her chest tightness and shortness of breath Referral to GI placed Continue current meds at this time  Return precautions discussed Webb Silversmith, NP

## 2017-06-20 NOTE — Patient Instructions (Signed)

## 2017-06-22 ENCOUNTER — Ambulatory Visit: Payer: Medicare Other | Attending: Internal Medicine | Admitting: Physical Therapy

## 2017-06-22 ENCOUNTER — Encounter: Payer: Self-pay | Admitting: Physical Therapy

## 2017-06-22 DIAGNOSIS — R42 Dizziness and giddiness: Secondary | ICD-10-CM

## 2017-06-22 DIAGNOSIS — R262 Difficulty in walking, not elsewhere classified: Secondary | ICD-10-CM | POA: Insufficient documentation

## 2017-06-22 NOTE — Therapy (Addendum)
[place] MAIN [facility] SERVICES [address] [place], [place], [address] Phone: [phone]   Fax:  [phone]  Physical Therapy Evaluation  Patient Details  Name: Julie Davies MRN: [id number] Date of Birth: 08/21/1927 No Data Recorded  Encounter Date: 06/22/2017      PT End of Session - 06/22/17 1259    Visit Number 1   Number of Visits 9   Date for PT Re-Evaluation 08/17/17   Authorization Type needs G codes   Authorization Time Period 1/10   PT Start Time 1302   PT Stop Time 1412   PT Time Calculation (min) 70 min   Equipment Utilized During Treatment Gait belt   Activity Tolerance Patient tolerated treatment well   Behavior During Therapy WFL for tasks assessed/performed      Past Medical History:  Diagnosis Date  . Allergy   . Arthritis   . Asthma   . Basal cell carcinoma of skin   . Chicken pox   . GERD (gastroesophageal reflux disease)   . History of kidney stones   . Pulmonary embolism on left [facility])     Past Surgical History:  Procedure Laterality Date  . APPENDECTOMY  1935  . DILATION AND CURETTAGE OF UTERUS  1975  . NEPHRECTOMY Right 1975  . REPLACEMENT TOTAL KNEE BILATERAL  2013  . VEIN LIGATION AND STRIPPING Left 1971    There were no vitals filed for this visit.       Subjective Assessment - 06/22/17 1259    Subjective Patient reports she experienced dizziness this morning but went back to sleep and when she woke up she felt better.    Pertinent History Patient reports she began having dizziness episodes off and on for years. Patient states it has gotten worse this past year. Patient reports she is getting episodes Patient reports riding in a car does not bother her or bring on her symptoms. Patient reports she feels she is getting tunnel vision, vertigo, unsteadiness, lightheadedness. Patient has not been to an ENT physician in regards to her symptoms. Patient reports she has been to the cardiologist and  had a chemical stress test, echocardiogram. Patient was told that she did not have a cardiac reason for her dizziness symptoms. Patient had MRI of spine and reports she has "narrowing" and "the bottom part is almost fused".   Diagnostic tests echocardiogram, cardiac stress test   Patient Stated Goals to reduce dizzinesss and improve balance   Currently in Pain? Other (Comment)  none stated            [facility] PT Assessment - 06/22/17 1351      Standardized Balance Assessment   Standardized Balance Assessment Dynamic Gait Index     Dynamic Gait Index   Level Surface Mild Impairment  17 seconds   Change in Gait Speed Mild Impairment   Gait with Horizontal Head Turns Moderate Impairment   Gait with Vertical Head Turns Mild Impairment   Gait and Pivot Turn Mild Impairment   Step Over Obstacle Mild Impairment   Step Around Obstacles Normal   Steps Mild Impairment   Total Score 16     Objective measurements completed on examination: See above findings.    VESTIBULAR AND BALANCE EVALUATION  HISTORY:  Subjective history of current problem: Patient reports she began having dizziness episodes off and on for years. Patient states it has gotten worse this past year. Patient reports she is getting episodes Patient reports riding in a car  does not bother her or bring on her symptoms. Patient reports she feels she is getting tunnel vision, vertigo, unsteadiness, lightheadedness. Patient has not been to an ENT physician in regards to her symptoms. Patient reports she has been to the cardiologist and had a chemical stress test, echocardiogram. Patient was told that she did not have a cardiac reason for her dizziness symptoms. Patient had MRI of spine and reports she has "narrowing" and "the bottom part is almost fused". Patient states she lives with her daughter and grandson. Patient lives on the first floor of her home with 3 steps to enter with rail from the garage. Patient reports next month she  plans to resume exercise class 4 times a week at the [facility]. Patient states she was not able to do her exercise classes this month secondary to not feeling well and dizziness.  Description of dizziness: vertigo, unsteadiness, lightheadedness, falling, aural fullness Frequency: a few times a week or more Duration: few minutes to one hour or less Symptom nature: motion provoked, variable, intermittent  Provocative Factors: bending over, overexertion, head turning, looking up Easing Factors: staying in one position while keeping her head still or lying down flat  Progression of symptoms: worse History of similar episodes: yes  Falls (yes/no): yes Number of falls in past 6 months: 1- tripped while going up stairs and fell  Prior Functional Level: Patient is prior limited community ambulator with AD.   Auditory complaints (tinnitus, pain, drainage): denies except occasional tinnitus; patient wears hearing aides. Vision (last eye exam, diplopia, recent changes): denies; patient wears glasses.  Current Symptoms: (dysarthria, dysphagia, drop attacks, bowel and bladder changes, recent weight loss/gain)    Review of systems negative for red flags except does report that her speech gets slurred only when she is having an episode of dizziness.     EXAMINATION  POSTURE:  Rounded shoulders       COORDINATION: Finger to Nose:  Dysmetric right and left Past Pointing:   Moderate Left  /  Moderate Right  Pronator Drift:  None present   MUSCULOSKELETAL SCREEN: Cervical Spine ROM:  Grossly 45 degrees to the right and 40 degrees to the left AROM cervical rotation. Patient reports "it feels like gravel when I turn my neck". Grossly limited to 35 degrees AROM cervical extension with discomfort at end range.   Gait: Patient arrives ambulating with SPC. Patient ambulates with decreased cadence with decreased step length and height. Patient with change in cadence and veering noted with  ambulation with head turns.  Scanning of visual environment with gait is: fair  Balance: Patient demonstrates difficulty with ambulation with head turns and body turns, uneven surfaces, narrow base of support and eyes closed activities.   POSTURAL CONTROL TESTS:   Clinical Test of Sensory Interaction for Balance    (CTSIB):  CONDITION TIME STRATEGY SWAY  Eyes open, firm surface 30 seconds ankle +1  Eyes closed, firm surface 30 seconds Ankle, hips +2  Eyes open, foam surface 30 seconds Ankle, hips +3  Eyes closed, foam surface 15 seconds Ankle, hips, reaching +4    OCULOMOTOR / VESTIBULAR TESTING:  Oculomotor Exam- Room Light  Normal Abnormal Comments  Ocular Alignment N    Ocular ROM N    Spontaneous Nystagmus N    End-Gaze Nystagmus  Abn Moderate nystagmus right beating with right eye gaze only  Smooth Pursuit  Abn Moderately saccadic with smooth pursuits and creates dizziness  Saccades  Abn   VOR  Abn  Blurring of target and dizziness  VOR Cancellation  Abn   Left Head Thrust   Deferred secondary to decreased cervical ROM and arthritis neck  Right Head Thrust   Deferred secondary to decreased cervical ROM and arthritis neck  Head Shaking Nystagmus   Deferred secondary to decreased cervical ROM and arthritis neck    BPPV TESTS:  Symptoms Duration Intensity Nystagmus  L Dix-Hallpike    Deferred will test next session  R Dix-Hallpike    Deferred will test next session    FUNCTIONAL OUTCOME MEASURES:  Results Comments  DHI 50/100 Moderate perception of handicap; in need of intervention  ABC Scale 41.5% High falls risk; in need of intervention  DGI 16/24 Falls risk; in need of intervention  10 meter Walking Speed 0.58 m/sec Below average as compared to age and gender normative values; in need of intervention   Neuromuscular Re-education:  VOR X 1 exercise:   Demonstrated and educated as to VOR X1.  Patient performed VOR X 1 horizontal in sitting 2 reps of 30 seconds  each with verbal cues for technique.  Patient reports the target is staying in focus and reports 4-5/10 dizziness.          PT Education - 06/22/17 1259    Education provided Yes   Education Details Reviewed plan of care, issued VOR for HEP   Person(s) Educated Patient   Methods Explanation;Demonstration;Verbal cues;Handout   Comprehension Verbalized understanding;Returned demonstration;Verbal cues required             PT Long Term Goals - 06/26/17 1157      PT LONG TERM GOAL #1   Title Patient will be independent with home exercise program for self management.    Time 4   Period Weeks   Status New     PT LONG TERM GOAL #2   Title Patient will demonstrate reduced falls risk as evidenced by Dynamic Gait Index (DGI) >19/24.   Baseline scored 16/24 on 06/22/17   Time 8   Period Weeks   Status New     PT LONG TERM GOAL #3   Title Patient will reduce falls risk as indicated by Activities Specific Balance Confidence Scale (ABC) >67%.   Baseline scored 41.5% on 06/22/17   Time 8   Period Weeks   Status New     PT LONG TERM GOAL #4   Title Patient will reduce perceived disability to low levels as indicated by <40 on Dizziness Handicap Inventory.   Baseline scored 50/100 moderate perception on 06/22/17   Time 8   Period Weeks   Status New                Plan - 06/22/17 1259    Clinical Impression Statement Patient presents with potential indicators of central and peripheral signs with oculomotor testing. Patient with abnormal saccades, smooth pursuits, VOR, VOR cancellation, and end gaze nystagmus. Patient is at high risk of falls as evidenced by DGI and ABC scale. Patient demonstrates dififculty with ambulation with head turns and quick turns as well as with cadence changes with gait. Patient would benefit from PT services to address functional deficits and goals as set on plan of care  and to help reduce falls risk.    History and Personal Factors relevant to plan  of care: multiple co-morbidities, age, chronicity,    Clinical Presentation Evolving   Clinical Decision Making Moderate   Rehab Potential Fair   PT Frequency 1x / week   PT  Duration 8 weeks   PT Treatment/Interventions Vestibular;Patient/family education;Canalith Repostioning;Gait training;Stair training;Therapeutic activities;Therapeutic exercise;Neuromuscular re-education;Balance training   PT Next Visit Plan review and progres VOR, smooth pursuits and saccades exercises, ambulation with head turns and to targets   PT Home Exercise Plan VOR in sitting 1 minute   Consulted and Agree with Plan of Care Patient      Patient will benefit from skilled therapeutic intervention in order to improve the following deficits and impairments:  Decreased balance, Difficulty walking, Decreased range of motion, Cardiopulmonary status limiting activity, Decreased coordination, Decreased mobility, Dizziness  Visit Diagnosis: Dizziness and giddiness  Difficulty in walking, not elsewhere classified      G-Codes - 07-11-2017 0947    Functional Assessment Tool Used (Outpatient Only) clinical judgment, 10 meter walking speed, ABC, DHI, DGI   Functional Limitation Mobility: Walking and moving around   Mobility: Walking and Moving Around Current Status [phone]) At least 40 percent but less than 60 percent impaired, limited or restricted   Mobility: Walking and Moving Around Goal Status [phone]) At least 20 percent but less than 40 percent impaired, limited or restricted       Problem List Patient Active Problem List   Diagnosis Date Noted  . Asthma 04/17/2017  . Age-related osteoporosis without current pathological fracture 10/16/2016  . COPD type A ([place]) 04/19/2015  . Pulmonary nodules 03/12/2015  . Essential hypertension 09/10/2014  . HLD (hyperlipidemia) 09/10/2014  . Gastroesophageal reflux disease without esophagitis 09/10/2014  . Arthritis 09/10/2014   Lady Deutscher PT,  DPT [phone] Lady Deutscher 2017/07/11, 9:52 AM  [place] MAIN [facility] SERVICES [address] [place], [place], [address] Phone: [phone]   Fax:  [phone]  Name: Namiko Pritts MRN: [id number] Date of Birth: November 05, 1926

## 2017-06-26 ENCOUNTER — Ambulatory Visit: Payer: Medicare Other | Attending: Internal Medicine | Admitting: Physical Therapy

## 2017-06-26 ENCOUNTER — Encounter: Payer: Self-pay | Admitting: Physical Therapy

## 2017-06-26 DIAGNOSIS — R262 Difficulty in walking, not elsewhere classified: Secondary | ICD-10-CM | POA: Insufficient documentation

## 2017-06-26 DIAGNOSIS — R42 Dizziness and giddiness: Secondary | ICD-10-CM | POA: Diagnosis not present

## 2017-06-26 NOTE — Addendum Note (Signed)
Addended by: Lorna Dibble on: 06/26/2017 12:08 PM   Modules accepted: Orders

## 2017-06-26 NOTE — Therapy (Signed)
[place] MAIN [facility] SERVICES [address] [place], [place], [address] Phone: [phone]   Fax:  [phone]  Physical Therapy Treatment  Patient Details  Name: Julie Davies MRN: [id number] Date of Birth: 1926-12-21 No Data Recorded  Encounter Date: 06/26/2017      PT End of Session - 06/26/17 1453    Visit Number 2   Number of Visits 9   Date for PT Re-Evaluation 08/17/17   Authorization Type needs G codes   Authorization Time Period 2/10   PT Start Time 1351   PT Stop Time 1439   PT Time Calculation (min) 48 min   Equipment Utilized During Treatment Gait belt   Activity Tolerance Patient tolerated treatment well   Behavior During Therapy WFL for tasks assessed/performed      Past Medical History:  Diagnosis Date  . Allergy   . Arthritis   . Asthma   . Basal cell carcinoma of skin   . Chicken pox   . GERD (gastroesophageal reflux disease)   . History of kidney stones   . Pulmonary embolism on left [facility])     Past Surgical History:  Procedure Laterality Date  . APPENDECTOMY  1935  . DILATION AND CURETTAGE OF UTERUS  1975  . NEPHRECTOMY Right 1975  . REPLACEMENT TOTAL KNEE BILATERAL  2013  . VEIN LIGATION AND STRIPPING Left 1971    There were no vitals filed for this visit.      Subjective Assessment - 06/26/17 1355    Subjective Patient reports that she tried doing the VOR exercise and states at times she had to stop due to either dizziness or her eyes were having difficulty focusing.    Pertinent History Patient reports she began having dizziness episodes off and on for years. Patient states it has gotten worse this past year. Patient reports she is getting episodes Patient reports riding in a car does not bother her or bring on her symptoms. Patient reports she feels she is getting tunnel vision, vertigo, unsteadiness, lightheadedness. Patient has not been to an ENT physician in regards to her symptoms. Patient  reports she has been to the cardiologist and had a chemical stress test, echocardiogram. Patient was told that she did not have a cardiac reason for her dizziness symptoms. Patient had MRI of spine and reports she has "narrowing" and "the bottom part is almost fused".   Diagnostic tests echocardiogram, cardiac stress test   Patient Stated Goals to reduce dizzinesss and improve balance   Currently in Pain? Yes   Pain Location Wrist   Pain Orientation Right;Left   Pain Descriptors / Indicators Sore   Pain Type Acute pain      Neuromuscular Re-education:  VOR X 1 exercise:  Patient reports that she has been doing VOR x 1 30 second reps in sitting with busy background.  Patient performed VOR X 1 horizontal in sitting 2 reps of 30 seconds and 1 rep of 45 seconds each. Attempted 1 minute reps but patient having difficulty focusing eyes on target for greater than 45 seconds. Patient demonstrating good technique but did require cue to increase head speed while keeping target in focus.  Patient reports the target is staying in focus and reports /10 dizziness.   Active eye movement between two targets: Discussed and demonstrated active eye movements between two targets exercise. Added this exercise for home exercise program.  Patient performed in supported sitting active eye movements between two targets horizontal 2 reps of  30 seconds each. Patient with verbal cuing initially for technique. Patient reported 5/10 dizziness with this exercise.  Saccade Exercises: Patient performed saccades exercise in sitting multiple 30 second reps with horizontal targets. Patient reports that it is harder for her eyes to concentrate on the right sided target. Noted multiple corrective hypometric saccadic eye movements to the left and right.  Patient reported 4/10 dizziness with this exercise.  Ambulation with head turns:  Patient performed 175' forwards and retro ambulation while scanning for targets in hallway.  Patient performed 175' trials of forwards and retro ambulation with horizontal head turns with CGA.   Patient demonstrates mild veering at times with retro ambulation. Patient demonstrates step -to gait pattern with left LE with retro ambulation.  Patient reports mild dizziness and imbalance with ambulation with head turns and reports retro ambulation is more challenging.   Airex pad: On firm surface and then on Airex pad, patient performed feet together and semi-tandem progressions with alternating lead leg with and without body turns and horizontal and vertical head turns. Patient demonstrates increased sway with these exercises.        PT Education - 06/26/17 1452    Education provided Yes   Education Details Added exercises to HEP-semi-tandem progressions and feet together on foam with horizontal and vertical head turns and body turns, active eye movements between 2 targets, discussed safety with HEP   Person(s) Educated Patient   Methods Explanation;Demonstration;Verbal cues;Handout   Comprehension Verbalized understanding;Returned demonstration             PT Long Term Goals - 06/26/17 1157      PT LONG TERM GOAL #1   Title Patient will be independent with home exercise program for self management.    Time 4   Period Weeks   Status New     PT LONG TERM GOAL #2   Title Patient will demonstrate reduced falls risk as evidenced by Dynamic Gait Index (DGI) >19/24.   Baseline scored 9/24 on 06/22/17   Time 8   Period Weeks   Status New     PT LONG TERM GOAL #3   Title Patient will reduce falls risk as indicated by Activities Specific Balance Confidence Scale (ABC) >67%.   Baseline scored 41.5% on 06/22/17   Time 8   Period Weeks   Status New     PT LONG TERM GOAL #4   Title Patient will reduce perceived disability to low levels as indicated by <40 on Dizziness Handicap Inventory.   Baseline scored 50/100 moderate perception on 06/22/17   Time 8   Period Weeks   Status  New               Plan - 06/26/17 1454    Clinical Impression Statement Patient reporting good compliance with HEP and demonstrated good technique in the clinic this date. Added semi-tandem progressions and feet together on foam with horizontal and vertical head turns and body turns and active eye movements between 2 targets to HEP. Patient reporting increased dizziness with trials of saccade and active eye movement between two target exercises in the clinic and would benefit from further practice of these activities to try to decrease patient's dizziness with eye tracking movements. Patient also demonstrates increased difficulty and dizziness with narrow BOS, uneven surfaces and ambulation with head and body turns. Patient would benefit from continued PT services to continue to address functional deficits.    Rehab Potential Fair   PT Frequency 1x / week  PT Duration 8 weeks   PT Treatment/Interventions Vestibular;Patient/family education;Canalith Repostioning;Gait training;Stair training;Therapeutic activities;Therapeutic exercise;Neuromuscular re-education;Balance training   PT Next Visit Plan smooth pursuits and saccades exercises, ball toss to self and over shoulder, Biodex tower resisted walking forward and leading with left leg   PT Home Exercise Plan VOR in sitting 45 second reps with conflicting background, semi-tandem progressions and feet together on foam with horizontal and vertical head turns and body turns, active eye movements between 2 targets   Consulted and Agree with Plan of Care Patient      Patient will benefit from skilled therapeutic intervention in order to improve the following deficits and impairments:  Decreased balance, Difficulty walking, Decreased range of motion, Cardiopulmonary status limiting activity, Decreased coordination, Decreased mobility, Dizziness, Decreased strength  Visit Diagnosis: Dizziness and giddiness  Difficulty in walking, not elsewhere  classified     Problem List Patient Active Problem List   Diagnosis Date Noted  . Asthma 04/17/2017  . Age-related osteoporosis without current pathological fracture 10/16/2016  . COPD type A ([place]) 04/19/2015  . Pulmonary nodules 03/12/2015  . Essential hypertension 09/10/2014  . HLD (hyperlipidemia) 09/10/2014  . Gastroesophageal reflux disease without esophagitis 09/10/2014  . Arthritis 09/10/2014   Lady Deutscher PT, DPT [phone] Lady Deutscher 06/26/2017, 3:18 PM  [place] MAIN [facility] SERVICES [address] [place], [place], [address] Phone: [phone]   Fax:  [phone]  Name: Julie Davies MRN: [id number] Date of Birth: [birth year]

## 2017-07-03 ENCOUNTER — Encounter: Payer: Self-pay | Admitting: Physical Therapy

## 2017-07-03 ENCOUNTER — Ambulatory Visit: Payer: Medicare Other | Admitting: Physical Therapy

## 2017-07-03 VITALS — BP 133/71 | HR 80

## 2017-07-03 DIAGNOSIS — R262 Difficulty in walking, not elsewhere classified: Secondary | ICD-10-CM

## 2017-07-03 DIAGNOSIS — Z23 Encounter for immunization: Secondary | ICD-10-CM | POA: Diagnosis not present

## 2017-07-03 DIAGNOSIS — R42 Dizziness and giddiness: Secondary | ICD-10-CM

## 2017-07-03 NOTE — Therapy (Signed)
[place] MAIN [facility] SERVICES [address] [place], [place], [address] Phone: [phone]   Fax:  [phone]  Physical Therapy Treatment  Patient Details  Name: Julie Davies MRN: [id number] Date of Birth: [birth year] No Data Recorded  Encounter Date: 07/03/2017      PT End of Session - 07/03/17 0815    Visit Number 3   Number of Visits 9   Date for PT Re-Evaluation 08/17/17   Authorization Type needs G codes   Authorization Time Period 3/10   PT Start Time 0815   PT Stop Time 0902   PT Time Calculation (min) 47 min   Equipment Utilized During Treatment Gait belt   Activity Tolerance Patient tolerated treatment well   Behavior During Therapy WFL for tasks assessed/performed      Past Medical History:  Diagnosis Date  . Allergy   . Arthritis   . Asthma   . Basal cell carcinoma of skin   . Chicken pox   . GERD (gastroesophageal reflux disease)   . History of kidney stones   . Pulmonary embolism on left [facility])     Past Surgical History:  Procedure Laterality Date  . APPENDECTOMY  1935  . DILATION AND CURETTAGE OF UTERUS  1975  . NEPHRECTOMY Right 1975  . REPLACEMENT TOTAL KNEE BILATERAL  2013  . VEIN LIGATION AND STRIPPING Left 1971    Vitals:   07/03/17 1018  BP: 133/71  Pulse: 80  SpO2: 100%        Subjective Assessment - 07/03/17 0814    Subjective Patient states she has been doing pretty good. Patient states on Saturday she was doing laundry with lots of bending over and then she tried to do her exercises and states "it did not take long to get dizzy".  Patient states she has been doing the exercises daily and has been able to work up to 1 minute reps of VOR and active eye movement between 2 targets exercises.  Patient states she was out in the yard and trip over a hose and states she lost her balance and had 4 shuffling steps but was able to catch herself without falling.  Patient states she was not dizzy at the  time and states she was not watching where she walked and tripped on the hose.    Pertinent History Patient reports she began having dizziness episodes off and on for years. Patient states it has gotten worse this past year. Patient reports she is getting episodes Patient reports riding in a car does not bother her or bring on her symptoms. Patient reports she feels she is getting tunnel vision, vertigo, unsteadiness, lightheadedness. Patient has not been to an ENT physician in regards to her symptoms. Patient reports she has been to the cardiologist and had a chemical stress test, echocardiogram. Patient was told that she did not have a cardiac reason for her dizziness symptoms. Patient had MRI of spine and reports she has "narrowing" and "the bottom part is almost fused".   Diagnostic tests echocardiogram, cardiac stress test   Patient Stated Goals to reduce dizzinesss and improve balance      Neuromuscular Re-education:  VOR X 1 exercise:  Patient performed VOR X 1 horizontal in standing 3 reps of 1 minute each. Patient demonstrating good technique.  Patient reports 2-3/10 dizziness.   Active eye movement between two targets: Patient performed in sitting active eye movements between two targets horizontal and vertical multiple 1 minute reps. Patient  with verbal cuing initially for technique to not move her head until her eyes have gotten to the target first. Patient reported 2-3/10 dizziness with this activity.   Therapeutic Exercise: Performed weight tower/ Biodex cable system machine walking forward and sidestepping to the right 10 reps with #10 pounds for forward walking and #7.5 for sidestepping to the right Patient required sitting rest break after 10 reps of forward walking and vital signs obtained. Patient required CGA with this activity and used SPC.    Airex pad:  On Airex pad, patient performed feet together progressions (progressed to feet together) and semi-tandem progressions with  alternating lead leg with horizontal and vertical head turns. Patient reports it is challenging to maintain balance when adding horizontal head turning.   Ball follows:  Patient performed static sitting while moving ball horizontally, vertically and then ball circles while tracking ball with head and eyes. Patient reports mild dizziness with this activity. Added this exercise to home exercise program.        PT Education - 07/03/17 0815    Education provided Yes   Education Details Reviewed HEP, added ball follows horizontal, vertical and in circles.   Person(s) Educated Patient   Methods Demonstration;Explanation   Comprehension Verbalized understanding;Returned demonstration             PT Long Term Goals - 06/26/17 1157      PT LONG TERM GOAL #1   Title Patient will be independent with home exercise program for self management.    Time 4   Period Weeks   Status New     PT LONG TERM GOAL #2   Title Patient will demonstrate reduced falls risk as evidenced by Dynamic Gait Index (DGI) >19/24.   Baseline scored 9/24 on 06/22/17   Time 8   Period Weeks   Status New     PT LONG TERM GOAL #3   Title Patient will reduce falls risk as indicated by Activities Specific Balance Confidence Scale (ABC) >67%.   Baseline scored 41.5% on 06/22/17   Time 8   Period Weeks   Status New     PT LONG TERM GOAL #4   Title Patient will reduce perceived disability to low levels as indicated by <40 on Dizziness Handicap Inventory.   Baseline scored 50/100 moderate perception on 06/22/17   Time 8   Period Weeks   Status New               Plan - 07/03/17 0815    Clinical Impression Statement Patient demonstrating good progress with therapy. She is able to do 1 minute reps of VOR x1 as well as active eye movement between two targets. Patient working on progressions of exercises and is now able to do feet together with head turns. Patient challenged by uneven surfaces and narrow BOS  activities and would benefit from further practice. Encourage patient to follow-up as inidicated.   Rehab Potential Fair   PT Frequency 1x / week   PT Duration 8 weeks   PT Treatment/Interventions Vestibular;Patient/family education;Canalith Repostioning;Gait training;Stair training;Therapeutic activities;Therapeutic exercise;Neuromuscular re-education;Balance training   PT Next Visit Plan ball toss to self and over shoulder, Biodex tower resisted walking forward and leading with right leg, body wall rolls, balance beam sidestepping, conflicting background with VOR   PT Home Exercise Plan VOR in sitting 45 second reps with conflicting background, semi-tandem progressions and feet together on foam with horizontal and vertical head turns and body turns, active eye movements between 2  targets   Consulted and Agree with Plan of Care Patient      Patient will benefit from skilled therapeutic intervention in order to improve the following deficits and impairments:  Decreased balance, Difficulty walking, Decreased range of motion, Cardiopulmonary status limiting activity, Decreased coordination, Decreased mobility, Dizziness, Decreased strength  Visit Diagnosis: Dizziness and giddiness  Difficulty in walking, not elsewhere classified     Problem List Patient Active Problem List   Diagnosis Date Noted  . Asthma 04/17/2017  . Age-related osteoporosis without current pathological fracture 10/16/2016  . COPD type A ([place]) 04/19/2015  . Pulmonary nodules 03/12/2015  . Essential hypertension 09/10/2014  . HLD (hyperlipidemia) 09/10/2014  . Gastroesophageal reflux disease without esophagitis 09/10/2014  . Arthritis 09/10/2014   Lady Deutscher PT, DPT [phone] Lady Deutscher 07/03/2017, 10:32 AM  [place] MAIN [facility] SERVICES [address] [place], [place], [address] Phone: [phone]   Fax:  [phone]  Name: Julie Davies MRN: [id number] Date of  Birth: 11/26/1926

## 2017-07-10 ENCOUNTER — Encounter: Payer: Self-pay | Admitting: Physical Therapy

## 2017-07-10 ENCOUNTER — Ambulatory Visit (INDEPENDENT_AMBULATORY_CARE_PROVIDER_SITE_OTHER): Payer: Medicare Other | Admitting: Internal Medicine

## 2017-07-10 ENCOUNTER — Ambulatory Visit: Payer: Medicare Other | Admitting: Physical Therapy

## 2017-07-10 ENCOUNTER — Encounter: Payer: Self-pay | Admitting: Internal Medicine

## 2017-07-10 VITALS — BP 140/80 | HR 89 | Ht 67.0 in | Wt 195.0 lb

## 2017-07-10 DIAGNOSIS — R262 Difficulty in walking, not elsewhere classified: Secondary | ICD-10-CM

## 2017-07-10 DIAGNOSIS — J4521 Mild intermittent asthma with (acute) exacerbation: Secondary | ICD-10-CM | POA: Diagnosis not present

## 2017-07-10 DIAGNOSIS — R42 Dizziness and giddiness: Secondary | ICD-10-CM | POA: Diagnosis not present

## 2017-07-10 DIAGNOSIS — I25118 Atherosclerotic heart disease of native coronary artery with other forms of angina pectoris: Secondary | ICD-10-CM | POA: Diagnosis not present

## 2017-07-10 MED ORDER — GUAIFENESIN-CODEINE 100-10 MG/5ML PO SOLN: 5.0000 mL | ORAL | 0 refills | Status: DC | PRN

## 2017-07-10 MED ORDER — AZITHROMYCIN 250 MG PO TABS: ORAL_TABLET | ORAL | 0 refills | Status: DC

## 2017-07-10 MED ORDER — PREDNISONE 20 MG PO TABS: 20.0000 mg | ORAL_TABLET | Freq: Every day | ORAL | 0 refills | Status: DC

## 2017-07-10 NOTE — Patient Instructions (Addendum)
Z pak Prednisone 20 mg daily for 5 days Cough syrup as needed

## 2017-07-10 NOTE — Progress Notes (Signed)
Date: 07/10/2017  MRN# [id number] Julie Davies [birth year]  Referring Physician: NP Webb Silversmith  Julie Davies is a 83 y.o. old female seen in consultation for asthma and lung nodules  CC:  Follow up ASTHMA  PREVIOUS HISTORY Follow up Patient is a pleasant 83 year old female for evaluation of lung nodules and asthma optimization. Patient has a history of lung nodules. He follow-up pulmonologist in [place], review of records show that she was following his earliest 2014 for 2 small 8 mm lower lobe nodules with mild adenopathy. Nodules are nonspecific in a patient with a history of choking, hiatal hernia and possible aspiration. At that time surveillance was recommended. Patient stated that she had her last CT in March 2016 at all pulmonologist office. Patient is a prolonged history of asthma, smoked for about 10 years quit in the 1950s, had significant secondhand smoking exposure from appearance. For her asthma she is currently on as needed albuterol, and Flovent. Patient states she has a mild intermittent slightly productive cough at times, has not had to use albuterol rescue inhaler in a few months. However she did state that prior to walking she will take 2 puffs of albuterol. Patient is a former Nature conservation officer, had several Torres in [place] and station in various areas throughout the [place] including [place], [place], [place]. Patient states she has mild dizziness today but this is a chronic problem, she lives with her daughter, she can walk up and down stairs with mild dyspnea. Review of records showed she had one asthma exacerbation in 2015 that was treated with antibiotics and steroids. As for her pulmonary nodules based on her CAT scan from 2016 patient states she has 2 new nodules for a total of 4 nodules. At today's visit there is no CD with CT scans to compare previous lung windows. Allergies: Symptoms controlled on Xyzal, Flonase and Pataday.  HPI Patient presents today for  follow-up visit of her ASTHMA Patient states she has intermittent wheezing and coughing episodes with increased work of breathing and shortness of breath It seems that her reflux is not under control -seeing GI next week Patient states she has intermittent fluttering in her chest along with some intermittent chest pain that radiates to her back-cardiology work up was nondiagnostic  Patient uses Advair daily +signs of infection at this time, +cough, +wheezing Doesn't feel well  CT Chest 12/2016 with patient there is new RUL GGO, all other nodules are stable and no growth  CT chest 04/13/17 with patient the right upper lobe groundglass opacity has completely resolved and all the other subcentimeter nodules have been stable since 2016   Medication:    Current Outpatient Prescriptions:  .  acetaminophen (TYLENOL) 500 MG tablet, Take 500 mg by mouth as needed., Disp: , Rfl:  .  albuterol (PROVENTIL HFA;VENTOLIN HFA) 108 (90 Base) MCG/ACT inhaler, Inhale 2 puffs into the lungs every 4 (four) hours as needed for wheezing or shortness of breath., Disp: 54 g, Rfl: 3 .  alendronate (FOSAMAX) 70 MG tablet, TAKE 1 TABLET EVERY 7 DAYS WITH A FULL GLASS OF WATER ON AN EMPTY STOMACH, Disp: 12 tablet, Rfl: 2 .  Alum Hydroxide-Mag Carbonate (GAVISCON EXTRA STRENGTH) 160-105 MG CHEW, Chew 2 tablets by mouth as needed., Disp: , Rfl:  .  aspirin 81 MG tablet, Take 81 mg by mouth daily., Disp: , Rfl:  .  Cholecalciferol (VITAMIN D) 2000 units CAPS, Take 1 capsule by mouth daily., Disp: , Rfl:  .  docusate sodium (COLACE) 100 MG capsule,  Take 100 mg by mouth 2 (two) times daily as needed for mild constipation., Disp: , Rfl:  .  fluticasone-salmeterol (ADVAIR HFA) 115-21 MCG/ACT inhaler, Inhale 2 puffs into the lungs 2 (two) times daily., Disp: 3 Inhaler, Rfl: 3 .  gabapentin (NEURONTIN) 300 MG capsule, Take 300 mg by mouth 2 (two) times daily. , Disp: , Rfl:  .  Ginger, Zingiber officinalis, (GINGER EXTRACT) 250 MG  CAPS, Take 250 mg by mouth 2 (two) times daily., Disp: , Rfl:  .  Glucos-MSM-C-Mn-Ginger-Willow (GLUCOSAMINE MSM COMPLEX) TABS, Take 1 tablet by mouth 2 (two) times daily., Disp: , Rfl:  .  levocetirizine (XYZAL) 5 MG tablet, Take 1 tablet (5 mg total) by mouth every evening., Disp: 30 tablet, Rfl: 0 .  Misc Natural Products (TART CHERRY ADVANCED) CAPS, Take 2 capsules by mouth daily., Disp: , Rfl:  .  Multiple Vitamins-Minerals (ONE DAILY MULTIVITAMIN WOMEN PO), Take 1 tablet by mouth daily., Disp: , Rfl:  .  Olopatadine HCl (PATADAY) 0.2 % SOLN, INSTILL 1 DROP TO EYE DAILY, Disp: 7.5 mL, Rfl: 1 .  pantoprazole (PROTONIX) 40 MG tablet, Take 1 tablet (40 mg total) by mouth daily. (Patient taking differently: Take 80 mg by mouth daily. ), Disp: 90 tablet, Rfl: 3 .  Polyethylene Glycol 3350 GRAN, 17 g by Does not apply route as needed., Disp: , Rfl:  .  SENNA CO, by Combination route., Disp: , Rfl:  .  simvastatin (ZOCOR) 20 MG tablet, Take 1 tablet (20 mg total) by mouth daily., Disp: 90 tablet, Rfl: 3 .  TURMERIC PO, Take 300 mg by mouth daily., Disp: , Rfl:  .  Wheat Dextrin (BENEFIBER) POWD, Take 4 g by mouth as needed., Disp: , Rfl:      Allergies:  Tramadol  Review of Systems: Gen:  Denies  fever, sweats, chills HEENT: Denies blurred vision, double vision, ear pain, eye pain, hearing loss, nose bleeds, sore throat Cvc:  No dizziness, chest pain or heaviness Resp:   +cough, +mostly non productive, +shortness of breath Other:  All other systems negative  Physical Examination:   BP 140/80 (BP Location: Left Arm, Cuff Size: Normal)   Pulse 89   Ht 5\' 7"  (1.702 m)   Wt 195 lb (88.5 kg)   SpO2 98%   BMI 30.54 kg/m   General Appearance: No distress  Neuro:without focal findings, mental status, speech normal, alert and oriented, cranial nerves 2-12 intact, reflexes normal and symmetric, sensation grossly normal  HEENT: PERRLA, EOM intact, no ptosis, no other lesions noticed;  Mallampati 2 Pulmonary: normal breath sounds., diaphragmatic excursion normal.No wheezing, No rales;   Sputum Production:  none CardiovascularNormal S1,S2.  No m/r/g.  Abdominal aorta pulsation normal.    Extremities: Anterior right leg with small incision, clean mild erythema around the site but no significant drainage.   CT Chest 04/02/15 No findings to suggest interstitial lung disease, 8 x 10 mm microlobulated and slightly spiculated nodule in the posterior aspect of the right upper lobe, concerning for potential primary bronchogenic neoplasm. Alternatively, given the patient's history of recent pneumonia, this could represent an area of post infectious or inflammatory scarring. There is an additional area of architectural distortion and slightly cephalad to this lesion within the right upper lobe which is also unusual, and favored to represent an area of scarring also. Mild diffuse bronchial wall thickening with mild centrilobular emphysema Three-vessel coronary artery disease  Pulmonary function testing 06/06/2013 FVC 98% FEV110% FEV1/FVC 79% RV 110 TLC 97 DLCO  73   Pulmonary Function Test 10/18/15  FEV1 80% FEV1/FVC 75% FVC 79%  RV 71% TLC 65% RV/TLC 106% ERV 24% DLCO 75%.  Impression: no significant obstruction or response to bronchodilators. Mild to moderate restriction noted. Severe reduction in ERV ( usually seen in abdominal obesity ), clinical correlation advised.   6 minute walk test - heart heart rate 107, total distance 994 feet /303 m , no somatic and desaturations, patient use cane during test.   CT chest 03/2017 1. Resolution of right upper lobe ground-glass nodule. 2. Stable numerous small bilateral pulmonary nodules since 2016. These are considered benign. 3. No new pulmonary lesions or acute pulmonary findings. 4. No mediastinal or hilar mass or lymphadenopathy. 5. Large hiatal hernia.   Assessment and Plan: 83 year old female follow up  for asthma-mild  intermittent in the setting of large hiatal hernia with gastrointestinal reflux disease with bilateral subcentimeter benign pulmonary nodules in the setting of deconditioned state with ongoing shortness of breath and dyspnea on exertion   #1 chest pain and intermittent fluttering Follow up with cardiology as scheduled  #2 mild intermittent asthma with mild exacerbation likely due acute bronchitis Start z pak Start prednisone 20 mg daily for 5 days  #3 reactive airways disease with cough Robitussin with codeine as needed  #4With h/o  pulmonary nodules Pulmonary nodules-previous work up of nodules Current CT scan of chest without contrast shows resolution of right upper lobe groundglass opacities but has persistent bilateral subcentimeter  pulmonary nodules that have been present since 2016 which are benign in nature   #5 deconditioned state Patient advised exercises tolerated  #6 GERD with large hiatal hernia Follow up GI next week   Patient satisfied with Plan of action and management. All questions answered  Follow-up in 6 months  Julie Davies, M.D.  [facility]  Medical Director [place] Director [facility] Cardio-Pulmonary Department

## 2017-07-11 NOTE — Therapy (Signed)
[place] MAIN [facility] SERVICES [address] [place], [place], [address] Phone: [phone]   Fax:  [phone]  Physical Therapy Treatment  Patient Details  Name: Julie Davies MRN: [id number] Date of Birth: [birth year] No Data Recorded  Encounter Date: 07/10/2017      PT End of Session - 07/11/17 1621    Visit Number 4   Number of Visits 9   Date for PT Re-Evaluation 08/17/17   Authorization Type needs G codes   Authorization Time Period 4/10   PT Start Time 1346   PT Stop Time 1435   PT Time Calculation (min) 49 min   Equipment Utilized During Treatment Gait belt   Activity Tolerance Patient tolerated treatment well   Behavior During Therapy WFL for tasks assessed/performed      Past Medical History:  Diagnosis Date  . Allergy   . Arthritis   . Asthma   . Basal cell carcinoma of skin   . Chicken pox   . GERD (gastroesophageal reflux disease)   . History of kidney stones   . Pulmonary embolism on left [facility])     Past Surgical History:  Procedure Laterality Date  . APPENDECTOMY  1935  . DILATION AND CURETTAGE OF UTERUS  1975  . NEPHRECTOMY Right 1975  . REPLACEMENT TOTAL KNEE BILATERAL  2013  . VEIN LIGATION AND STRIPPING Left 1971    There were no vitals filed for this visit.      Subjective Assessment - 07/10/17 1353    Subjective Patient states she fell at home over the weekend. Patient states she slipped while doing her eexercise while standing on her pillow. Patient states she saw Dr. Mortimer Fries and she states she did not feel well afterwards and states she is noticing that she has been coughing momre. Patient states she has not been doing her walking lately unless someone can walk with her. Patient was put on Zpack and cough medicine with codeine.    Pertinent History Patient reports she began having dizziness episodes off and on for years. Patient states it has gotten worse this past year. Patient reports she is getting  episodes Patient reports riding in a car does not bother her or bring on her symptoms. Patient reports she feels she is getting tunnel vision, vertigo, unsteadiness, lightheadedness. Patient has not been to an ENT physician in regards to her symptoms. Patient reports she has been to the cardiologist and had a chemical stress test, echocardiogram. Patient was told that she did not have a cardiac reason for her dizziness symptoms. Patient had MRI of spine and reports she has "narrowing" and "the bottom part is almost fused".   Diagnostic tests echocardiogram, cardiac stress test   Patient Stated Goals to reduce dizzinesss and improve balance   Pain Location Head   Pain Orientation Right;Anterior   Pain Descriptors / Indicators Sore   Pain Type Acute pain      Neuromuscular Re-education:  VOR X 1 exercise:  Patient performed VOR X 1 horizontal in standing with conflicting background 3 reps of 1 minute each with verbal cues for technique.  Patient reports increased dizziness with conflicting background. Added this progression to HEP.    Therapeutic Exercise: Performed Biodex weight tower machine walking forward 10 reps with #12.5 pounds and sidestepping to the right 10 reps with #10 pounds using SPC with CGA.   Body Wall Rolls:  Patient performed 4 reps of supported, body wall rolls with eyes open with CGA secondary  to patient with several small losses of balance that she was able to self-correct by touching the wall for support. Patient reports increased dizziness with this activity.   On Airex balance beam: Patient performed sidestepping left/right without head turns and then with horizontal head turns multiple reps times 5' each with CGA. Performed static sideways stance with horizontal and vertical head turns multiple 1 minute reps with CGA.        PT Education - 07/11/17 1620    Education provided Yes   Education Details Reinforced safety precautions and HEP. reviewed HEP and added  conflicting background VOR x1 progression.    Person(s) Educated Patient   Methods Explanation;Demonstration;Handout;Verbal cues   Comprehension Verbalized understanding;Returned demonstration             PT Long Term Goals - 06/26/17 1157      PT LONG TERM GOAL #1   Title Patient will be independent with home exercise program for self management.    Time 4   Period Weeks   Status New     PT LONG TERM GOAL #2   Title Patient will demonstrate reduced falls risk as evidenced by Dynamic Gait Index (DGI) >19/24.   Baseline scored 9/24 on 06/22/17   Time 8   Period Weeks   Status New     PT LONG TERM GOAL #3   Title Patient will reduce falls risk as indicated by Activities Specific Balance Confidence Scale (ABC) >67%.   Baseline scored 41.5% on 06/22/17   Time 8   Period Weeks   Status New     PT LONG TERM GOAL #4   Title Patient will reduce perceived disability to low levels as indicated by <40 on Dizziness Handicap Inventory.   Baseline scored 50/100 moderate perception on 06/22/17   Time 8   Period Weeks   Status New               Plan - 07/12/17 0913    Clinical Impression Statement Patient able to progress to conflicting background with VOR X 1 exercise. Patient able to tolerate increased weight with Biodex resisted walking and did not require rest break during this activity this date. Patient performed body wall rolls and this reproduced dizziness; however, did not add this to HEP at this time as patient requiring CGA as she had several small losses of balance that she was able to self-correct. Patient continues to be challenged by compliant surfaces, head turning and narrow BOS activities. Patient would benefit from continued PT services to address goals, try to decrease patient's falls risk and to decrease patient's subjective symptoms of dizziness.   Rehab Potential Fair   PT Frequency 1x / week   PT Duration 8 weeks   PT Treatment/Interventions  Vestibular;Patient/family education;Canalith Repostioning;Gait training;Stair training;Therapeutic activities;Therapeutic exercise;Neuromuscular re-education;Balance training   PT Next Visit Plan ball toss to self and over shoulder, Biodex tower resisted walking forward and leading with right leg, body wall rolls, balance beam sidestepping, conflicting background with VOR   PT Home Exercise Plan VOR in sitting 45 second reps with conflicting background, semi-tandem progressions and feet together on foam with horizontal and vertical head turns and body turns, active eye movements between 2 targets   Consulted and Agree with Plan of Care Patient      Patient will benefit from skilled therapeutic intervention in order to improve the following deficits and impairments:  Decreased balance, Difficulty walking, Decreased range of motion, Cardiopulmonary status limiting activity, Decreased coordination, Decreased  mobility, Dizziness, Decreased strength  Visit Diagnosis: Dizziness and giddiness  Difficulty in walking, not elsewhere classified     Problem List Patient Active Problem List   Diagnosis Date Noted  . Asthma 04/17/2017  . Age-related osteoporosis without current pathological fracture 10/16/2016  . COPD type A ([place]) 04/19/2015  . Pulmonary nodules 03/12/2015  . Essential hypertension 09/10/2014  . HLD (hyperlipidemia) 09/10/2014  . Gastroesophageal reflux disease without esophagitis 09/10/2014  . Arthritis 09/10/2014   Lady Deutscher PT, DPT [phone] Lady Deutscher 07/11/2017, 4:22 PM  [place] MAIN [facility] SERVICES [address] [place], [place], [address] Phone: [phone]   Fax:  [phone]  Name: Julie Davies MRN: [id number] Date of Birth: 04/04/1927

## 2017-07-16 ENCOUNTER — Telehealth: Payer: Self-pay | Admitting: Internal Medicine

## 2017-07-16 ENCOUNTER — Ambulatory Visit: Payer: Medicare Other | Admitting: Internal Medicine

## 2017-07-16 MED ORDER — AZITHROMYCIN 250 MG PO TABS: ORAL_TABLET | ORAL | 0 refills | Status: DC

## 2017-07-16 NOTE — Telephone Encounter (Signed)
Patient calling to fu on ov   She would like Zpack rx sent to [facility]

## 2017-07-16 NOTE — Telephone Encounter (Signed)
Zpak sent to [facility] on [address] since DK sent RX to [facility] on day on visit and pt never received. Nothing further needed.

## 2017-07-17 ENCOUNTER — Encounter: Payer: Self-pay | Admitting: Gastroenterology

## 2017-07-17 ENCOUNTER — Encounter: Payer: Self-pay | Admitting: Physical Therapy

## 2017-07-17 ENCOUNTER — Ambulatory Visit (INDEPENDENT_AMBULATORY_CARE_PROVIDER_SITE_OTHER): Payer: Medicare Other | Admitting: Gastroenterology

## 2017-07-17 ENCOUNTER — Ambulatory Visit: Payer: Medicare Other | Admitting: Physical Therapy

## 2017-07-17 VITALS — BP 120/72 | HR 84 | Temp 97.8°F | Ht 67.0 in | Wt 194.0 lb

## 2017-07-17 DIAGNOSIS — R42 Dizziness and giddiness: Secondary | ICD-10-CM | POA: Diagnosis not present

## 2017-07-17 DIAGNOSIS — K219 Gastro-esophageal reflux disease without esophagitis: Secondary | ICD-10-CM

## 2017-07-17 DIAGNOSIS — I25118 Atherosclerotic heart disease of native coronary artery with other forms of angina pectoris: Secondary | ICD-10-CM

## 2017-07-17 DIAGNOSIS — R262 Difficulty in walking, not elsewhere classified: Secondary | ICD-10-CM | POA: Diagnosis not present

## 2017-07-17 NOTE — Patient Instructions (Signed)
     Seated in a chair, repeat exercise isolating one leg at a time 2 sets of 10 repetitions each.    Seated in a chair, repeat exercise isolating one leg at a time 2 sets of 10 repetitions each.

## 2017-07-17 NOTE — Therapy (Signed)
[place] MAIN [facility] SERVICES [address] [place], [place], [address] Phone: [phone]   Fax:  [phone]  Physical Therapy Treatment  Patient Details  Name: Julie Davies MRN: [id number] Date of Birth: 1927/09/23 No Data Recorded  Encounter Date: 07/17/2017      PT End of Session - 07/20/17 1434    Visit Number 5   Number of Visits 9   Date for PT Re-Evaluation 08/17/17   Authorization Type needs G codes   Authorization Time Period 5/10   PT Start Time 1127   PT Stop Time 1207   PT Time Calculation (min) 40 min   Equipment Utilized During Treatment Gait belt   Activity Tolerance Patient tolerated treatment well   Behavior During Therapy WFL for tasks assessed/performed      Past Medical History:  Diagnosis Date  . Allergy   . Arthritis   . Asthma   . Basal cell carcinoma of skin   . Chicken pox   . GERD (gastroesophageal reflux disease)   . History of kidney stones   . Pulmonary embolism on left  [facility])     Past Surgical History:  Procedure Laterality Date  . APPENDECTOMY  1935  . DILATION AND CURETTAGE OF UTERUS  1975  . NEPHRECTOMY Right 1975  . REPLACEMENT TOTAL KNEE BILATERAL  2013  . VEIN LIGATION AND STRIPPING Left 1971    There were no vitals filed for this visit.      Subjective Assessment - 07/17/17 1130    Subjective Patient states last Thursday and Friday she was not feeling well. Patient reports she is going to see her GI physician today. Patient states she feels like she is getting a cold and states that there was a mix-up with her prescriptions and she did not get the Z-pack. Patient states she called her pharmacist and she is going to pick the Z-pack tomorrow.    Pertinent History Patient reports she began having dizziness episodes off and on for years. Patient states it has gotten worse this past year. Patient reports she is getting episodes Patient reports riding in a car does not bother her or  bring on her symptoms. Patient reports she feels she is getting tunnel vision, vertigo, unsteadiness, lightheadedness. Patient has not been to an ENT physician in regards to her symptoms. Patient reports she has been to the cardiologist and had a chemical stress test, echocardiogram. Patient was told that she did not have a cardiac reason for her dizziness symptoms. Patient had MRI of spine and reports she has "narrowing" and "the bottom part is almost fused".   Diagnostic tests echocardiogram, cardiac stress test   Patient Stated Goals to reduce dizzinesss and improve balance      Neuromuscular Re-education:  VOR x 1 exercise: Patient performed walking VOR X 1 horizontal with conflicting background multiple 25' reps. Patient with veering noted with ambulation and decreased gait speed while performing VOR.  Patient reports mild dizziness symptoms with this exercise.  1/2 Foam Roll:  On 1/2 foam roll with flat side down and then round side down worked on static holds for 3-5 minutes each. Worked on 1/2 foam roll flat side down static holds with and without horizontal head turns.  Worked on 1/2 foam roll flat side down static holds with side-stepping left and right with horizontal head turns multiple reps.  Patient requiring assistance ranging from CGA to mod/min A.  Patient initially using reaching for support on // bars as primary  strategy for loss of balance but with practice was able to better utilize hip and ankle strategies.  Theraband exercises: Patient performed in sitting 15 reps bilateral hip abduction (clamshells) and then performed isolated hip abduction exercise with green Theraband.  Issued for HEP and handout provided.    Body Wall Rolls:  Patient performed 4 reps of supported, body wall rolls with eyes open.  Patient reports mild dizziness and imbalance with this activity.         PT Education - 07/17/17 1132    issued seated green Theraband hip abduction exercise.     Education provided Yes   Person(s) Educated Patient   Methods Explanation;Demonstration   Comprehension Verbalized understanding;Returned demonstration             PT Long Term Goals - 06/26/17 1157      PT LONG TERM GOAL #1   Title Patient will be independent with home exercise program for self management.    Time 4   Period Weeks   Status New     PT LONG TERM GOAL #2   Title Patient will demonstrate reduced falls risk as evidenced by Dynamic Gait Index (DGI) >19/24.   Baseline scored 9/24 on 06/22/17   Time 8   Period Weeks   Status New     PT LONG TERM GOAL #3   Title Patient will reduce falls risk as indicated by Activities Specific Balance Confidence Scale (ABC) >67%.   Baseline scored 41.5% on 06/22/17   Time 8   Period Weeks   Status New     PT LONG TERM GOAL #4   Title Patient will reduce perceived disability to low levels as indicated by <40 on Dizziness Handicap Inventory.   Baseline scored 50/100 moderate perception on 06/22/17   Time 8   Period Weeks   Status New               Plan - 07/20/17 1434    Clinical Impression Statement Patient challenged by walking VOR X 1 with conflicting background exercise. Patient issued setaed hip abduction exercise with green Theraband to work on hip muscle strengthening. Patient challenged by activities on 1/2 foam roll but improved with practice and was better able to utilize ankle and hip strategies with losses of balance. Encouraged patient to follow-up as indicated.    Rehab Potential Fair   PT Frequency 1x / week   PT Duration 8 weeks   PT Treatment/Interventions Vestibular;Patient/family education;Canalith Repostioning;Gait training;Stair training;Therapeutic activities;Therapeutic exercise;Neuromuscular re-education;Balance training   PT Next Visit Plan ball toss to self and over shoulder, Biodex tower resisted walking forward and leading with right leg, body wall rolls, sidestepping with green T band around  thighs with and without head turns   PT Home Exercise Plan VOR in sitting 45 second reps with conflicting background, semi-tandem progressions and feet together on foam with horizontal and vertical head turns and body turns, active eye movements between 2 targets   Consulted and Agree with Plan of Care Patient      Patient will benefit from skilled therapeutic intervention in order to improve the following deficits and impairments:  Decreased balance, Difficulty walking, Decreased range of motion, Cardiopulmonary status limiting activity, Decreased coordination, Decreased mobility, Dizziness, Decreased strength  Visit Diagnosis: Dizziness and giddiness  Difficulty in walking, not elsewhere classified     Problem List Patient Active Problem List   Diagnosis Date Noted  . Asthma 04/17/2017  . Age-related osteoporosis without current pathological fracture 10/16/2016  .  COPD type A ([place]) 04/19/2015  . Pulmonary nodules 03/12/2015  . Essential hypertension 09/10/2014  . HLD (hyperlipidemia) 09/10/2014  . Gastroesophageal reflux disease without esophagitis 09/10/2014  . Arthritis 09/10/2014   Lady Deutscher PT, DPT [phone] Lady Deutscher 07/17/2017, 11:35 AM  [place] MAIN [facility] SERVICES [address] [place], [place], [address] Phone: [phone]   Fax:  [phone]  Name: Julie Davies MRN: [id number] Date of Birth: Apr 23, 1927

## 2017-07-17 NOTE — Progress Notes (Signed)
[name] MD, MRCP(U.K) [address]  [place]  [place], [place] [address]  Main: [phone]  Fax: [phone]   Gastroenterology Consultation  Referring Provider:     Jearld Fenton, NP Primary Care Physician:  Jearld Fenton, NP Primary Gastroenterologist:  Dr. [name]  Reason for Consultation:     GERD        HPI:   Julie Davies is a 83 y.o. y/o female   She has been referred for GERD. She also has abdominal pain, bloating , constipation   Reflux:  Onset : On and off for many years  Symptoms: Describes her symptoms pressure in her chest , will eat and all of a sudden when she bends has regurgitation, food comes back when in sleep , burning sensation in her throat/mouth, sour sensation in her mouth .  Recent weight gain: up and down with her weight  Medications: No  Narcotics or anticholinergics use : No  PPI /H2 blockers or Antacid  use and timing : used to take pantoprazole in the am before bfast +- gaviscon - out of 7 days says 1-2 episodes of reflux- anytime of the day  Dinner time : 7 pm , goes to bed around 11 pm , in between -watching tv sitting up   Prior EGD: no   Large hiatal hernia per l;ast CT scan of her chest   Constipation: Onset al her life  Frequency of bowel movements presently while on metamucil - has one every day - takes 1 cap a day , senna at night . Started metamucil on and off , regularly started 6 weeks back , since starting regular metamucil- increased gas  Consistency : mashed potato  Shape no  , any new change Pain sometimes has pressure before a BM and relived after  Bloating/abdominal distension yes  Bleeding no  Last colonoscopy 6 years back - was apparently normal  Weight loss no  Family history of colon cancer no  Thyroid abnormalities yes - checked last 6/18 was normal   She is here with her daughter - last 3 months her symptoms have been worse.     Past Medical History:  Diagnosis Date  . Allergy   .  Arthritis   . Asthma   . Basal cell carcinoma of skin   . Chicken pox   . GERD (gastroesophageal reflux disease)   . History of kidney stones   . Pulmonary embolism on left [facility])     Past Surgical History:  Procedure Laterality Date  . APPENDECTOMY  1935  . DILATION AND CURETTAGE OF UTERUS  1975  . NEPHRECTOMY Right 1975  . REPLACEMENT TOTAL KNEE BILATERAL  2013  . VEIN LIGATION AND STRIPPING Left 1971    Prior to Admission medications   Medication Sig Start Date End Date Taking? Authorizing Provider  acetaminophen (TYLENOL) 500 MG tablet Take 500 mg by mouth as needed.   Yes [provider]  albuterol (PROVENTIL HFA;VENTOLIN HFA) 108 (90 Base) MCG/ACT inhaler Inhale 2 puffs into the lungs every 4 (four) hours as needed for wheezing or shortness of breath. 10/20/16  Yes Baity, Coralie Keens, NP  alendronate (FOSAMAX) 70 MG tablet TAKE 1 TABLET EVERY 7 DAYS WITH A FULL GLASS OF WATER ON AN EMPTY STOMACH 06/04/17  Yes Baity, Coralie Keens, NP  Alum Hydroxide-Mag Carbonate (GAVISCON EXTRA STRENGTH) 160-105 MG CHEW Chew 2 tablets by mouth as needed.   Yes [provider]  aspirin 81 MG tablet Take  81 mg by mouth daily.   Yes [provider]  azithromycin (ZITHROMAX Z-PAK) 250 MG tablet Take 2 tablets on Day 1 and then 1 tablet daily till gone. 07/16/17  Yes Flora Lipps, MD  Cholecalciferol (VITAMIN D) 2000 units CAPS Take 1 capsule by mouth daily.   Yes [provider]  docusate sodium (COLACE) 100 MG capsule Take 100 mg by mouth 2 (two) times daily as needed for mild constipation.   Yes [provider]  fluticasone-salmeterol (ADVAIR HFA) 115-21 MCG/ACT inhaler Inhale 2 puffs into the lungs 2 (two) times daily. 02/27/17  Yes Flora Lipps, MD  gabapentin (NEURONTIN) 300 MG capsule Take 300 mg by mouth 2 (two) times daily.  06/10/15  Yes [provider]  Ginger, Zingiber officinalis, (GINGER EXTRACT) 250 MG CAPS Take 250 mg by mouth 2 (two) times  daily.   Yes [provider]  Glucos-MSM-C-Mn-Ginger-Willow (GLUCOSAMINE MSM COMPLEX) TABS Take 1 tablet by mouth 2 (two) times daily.   Yes [provider]  guaiFENesin-codeine 100-10 MG/5ML syrup Take 5 mLs by mouth every 4 (four) hours as needed for cough. 07/10/17  Yes Kasa, Maretta Bees, MD  levocetirizine (XYZAL) 5 MG tablet Take 1 tablet (5 mg total) by mouth every evening. 09/10/14  Yes Baity, Coralie Keens, NP  Misc Natural Products (TART CHERRY ADVANCED) CAPS Take 2 capsules by mouth daily.   Yes [provider]  Multiple Vitamins-Minerals (ONE DAILY MULTIVITAMIN WOMEN PO) Take 1 tablet by mouth daily.   Yes [provider]  Olopatadine HCl (PATADAY) 0.2 % SOLN INSTILL 1 DROP TO EYE DAILY 10/20/16  Yes Baity, Coralie Keens, NP  pantoprazole (PROTONIX) 40 MG tablet Take 1 tablet (40 mg total) by mouth daily. Patient taking differently: Take 80 mg by mouth daily.  04/17/17 04/17/18 Yes KasaMaretta Bees, MD  Polyethylene Glycol 3350 GRAN 17 g by Does not apply route as needed.   Yes [provider]  predniSONE (DELTASONE) 20 MG tablet Take 1 tablet (20 mg total) by mouth daily with breakfast. 5 days 07/10/17  Yes Flora Lipps, MD  SENNA CO by Combination route.   Yes [provider]  simvastatin (ZOCOR) 20 MG tablet Take 1 tablet (20 mg total) by mouth daily. 05/22/17  Yes Minna Merritts, MD  TURMERIC PO Take 300 mg by mouth daily.   Yes [provider]  Wheat Dextrin (BENEFIBER) POWD Take 4 g by mouth as needed.    [provider]    Family History  Problem Relation Age of Onset  . Heart disease Father   . Stroke Maternal Uncle   . Heart disease Maternal Aunt   . Cancer Neg Hx   . Diabetes Neg Hx      Social History  Substance Use Topics  . Smoking status: Never Smoker  . Smokeless tobacco: Never Used  . Alcohol use 0.0 oz/week     Comment: rare--wine    Allergies as of 07/17/2017 - Review Complete 07/17/2017  Allergen  Reaction Noted  . Tramadol Other (See Comments) 01/02/2017    Review of Systems:    All systems reviewed and negative except where noted in HPI.   Physical Exam:  BP 120/72 (BP Location: Left Arm, Patient Position: Sitting, Cuff Size: Normal)   Pulse 84   Temp 97.8 F (36.6 C) (Oral)   Ht 5\' 7"  (1.702 m)   Wt 194 lb (88 kg)   BMI 30.38 kg/m  No LMP recorded. Patient is postmenopausal. Psych:  Alert  and cooperative. Normal mood and affect. General:   Alert,  Well-developed, well-nourished, pleasant and cooperative in NAD Head:  Normocephalic and atraumatic. Eyes:  Sclera clear, no icterus.   Conjunctiva pink. Ears:  Normal auditory acuity. Nose:  No deformity, discharge, or lesions. Mouth:  No deformity or lesions,oropharynx pink & moist. Neck:  Supple; no masses or thyromegaly. Lungs:  Respirations even and unlabored.  Clear throughout to auscultation.   No wheezes, crackles, or rhonchi. No acute distress. Heart:  Regular rate and rhythm; no murmurs, clicks, rubs, or gallops. Abdomen:  Normal bowel sounds.  No bruits.  Soft, non-tender and non-distended without masses, hepatosplenomegaly or hernias noted.  No guarding or rebound tenderness.    Neurologic:  Alert and oriented x3;  grossly normal neurologically. Skin:  Intact without significant lesions or rashes. No jaundice. Lymph Nodes:  No significant cervical adenopathy. Psych:  Alert and cooperative. Normal mood and affect.  Imaging Studies: No results found.  Assessment and Plan:   Julie Davies is a 83 y.o. y/o female has been referred for GERD. Likely due to large underlying hiatal hernia .    Plan  1. Stop all artificial sugars 2. Trial of Dexilant (2 weeks samples provided) , add zantac at night  3. GERd patient information- counseling provided for life style changes. 4. LOW FODMAP diet   5. If no better at next visit then will consider imaging +/- endoscopy   Follow up in 4-6 weeks   Dr [name]  MD,MRCP(U.K)

## 2017-07-24 ENCOUNTER — Encounter: Payer: Self-pay | Admitting: Physical Therapy

## 2017-07-24 ENCOUNTER — Ambulatory Visit: Payer: Medicare Other | Admitting: Physical Therapy

## 2017-07-24 DIAGNOSIS — R262 Difficulty in walking, not elsewhere classified: Secondary | ICD-10-CM

## 2017-07-24 DIAGNOSIS — R42 Dizziness and giddiness: Secondary | ICD-10-CM

## 2017-07-24 NOTE — Therapy (Signed)
[place] MAIN [facility] SERVICES [address] [place], [place], [address] Phone: [phone]   Fax:  [phone]  Physical Therapy Treatment  Patient Details  Name: Julie Davies MRN: [id number] Date of Birth: Feb 11, 1927 No Data Recorded  Encounter Date: 07/24/2017      PT End of Session - 07/24/17 1345    Visit Number 6   Number of Visits 9   Date for PT Re-Evaluation 08/17/17   Authorization Type needs G codes   Authorization Time Period 6   PT Start Time 1341   PT Stop Time 1419   PT Time Calculation (min) 38 min   Equipment Utilized During Treatment Gait belt   Activity Tolerance Patient tolerated treatment well   Behavior During Therapy WFL for tasks assessed/performed      Past Medical History:  Diagnosis Date  . Allergy   . Arthritis   . Asthma   . Basal cell carcinoma of skin   . Chicken pox   . GERD (gastroesophageal reflux disease)   . History of kidney stones   . Pulmonary embolism on left [facility])     Past Surgical History:  Procedure Laterality Date  . APPENDECTOMY  1935  . DILATION AND CURETTAGE OF UTERUS  1975  . NEPHRECTOMY Right 1975  . REPLACEMENT TOTAL KNEE BILATERAL  2013  . VEIN LIGATION AND STRIPPING Left 1971    There were no vitals filed for this visit.      Subjective Assessment - 07/24/17 1344    Subjective Patient states that she feels her symptoms are "easing a little bit" and that "it is getting better". Patient reports that she has returned to attending the [facility] center exercise classes now and that she is doing better with being able to perform the exercises.    Pertinent History Patient reports she began having dizziness episodes off and on for years. Patient states it has gotten worse this past year. Patient reports she is getting episodes Patient reports riding in a car does not bother her or bring on her symptoms. Patient reports she feels she is getting tunnel vision, vertigo,  unsteadiness, lightheadedness. Patient has not been to an ENT physician in regards to her symptoms. Patient reports she has been to the cardiologist and had a chemical stress test, echocardiogram. Patient was told that she did not have a cardiac reason for her dizziness symptoms. Patient had MRI of spine and reports she has "narrowing" and "the bottom part is almost fused".   Diagnostic tests echocardiogram, cardiac stress test   Patient Stated Goals to reduce dizzinesss and improve balance            [facility] PT Assessment - 07/24/17 1354      Dynamic Gait Index   Level Surface Mild Impairment   Change in Gait Speed Mild Impairment   Gait with Horizontal Head Turns Normal   Gait with Vertical Head Turns Normal   Gait and Pivot Turn Normal   Step Over Obstacle Mild Impairment   Step Around Obstacles Normal   Steps Mild Impairment   Total Score 20      Neuromuscular Re-education:  FUNCTIONAL OUTCOME MEASURES:  Results Comments  DHI 34/100 low perception of handicap  ABC Scale 49.4% Falls risk; in need of intervention  DGI 20/24 Mild impairment  10 meter Walking Speed 0.83 m/sec Within normative values for patient's age and gender   Discussed functional outcome measures results and compared to prior testing. Discussed progress towards goals  and plan of care.   Ball Sorting Activity: On firm surface, performed transferring multicolored balls from bin placed on floor to another bin placed 180 degrees on the other side of patient while patient visually tracks the ball so that patient is required to turn 180 degrees Left and Right to turn to bend over and pick and place balls in the bins with S.  Patient reports 3/10 dizziness with this activity and required sitting rest break upon stopping.   Note: Patient did not use cane during entire session this date.        PT Education - 07/24/17 1344    Education provided Yes   Education Details discussed functional outcome measures  results and compared to prior testing, discussed progress towards goals   Person(s) Educated Patient   Methods Explanation;Demonstration   Comprehension Verbalized understanding;Returned demonstration             PT Long Term Goals - 07/24/17 1344      PT LONG TERM GOAL #1   Title Patient will be independent with home exercise program for self management.    Time 4   Period Weeks   Status Achieved     PT LONG TERM GOAL #2   Title Patient will demonstrate reduced falls risk as evidenced by Dynamic Gait Index (DGI) >19/24.   Baseline scored 9/24 on 06/22/17; scored 20/24 on 07/24/17   Time 8   Period Weeks   Status Achieved     PT LONG TERM GOAL #3   Title Patient will reduce falls risk as indicated by Activities Specific Balance Confidence Scale (ABC) >67%.   Baseline scored 41.5% on 06/22/17; scored 49.4% on 07/24/17   Time 8   Period Weeks   Status Partially Met     PT LONG TERM GOAL #4   Title Patient will reduce perceived disability to low levels as indicated by <40 on Dizziness Handicap Inventory.   Baseline scored 50/100 moderate perception on 06/22/17; scored 34/100 on 07/24/17   Time 8   Period Weeks   Status Achieved     PT LONG TERM GOAL #5   Title Patient will report 50% improvement or greater in regards to her symptoms of dizziness and imbalance in order to be able to participate in community and home activities safely.   Time 4   Period Weeks   Status New   Target Date 08/21/17               Plan - 07/24/17 1346    Clinical Impression Statement Repeated functional outcome testing this date and patient demonstrating good progress with therapy services. Patient has met 3/4 goals as set on plan of care and partially met remaining goal. Patient improved from 9/24 to 20/24 on the DGI. and from moderate to low perception of handicap on the [facility]. Patient improved her 10 meter walking speed from 0.58 m/sec to 0.83 m/sec. Patient improved from 41.5 to 49.4% on  the ABC scale. Patient would benefit from continued PT services to address remaining goal as well as additional goal set this date. Patient would like to work on further improving her balance and reducing her dizziness symptoms.    Rehab Potential Fair   PT Frequency 1x / week   PT Duration 8 weeks   PT Treatment/Interventions Vestibular;Patient/family education;Canalith Repostioning;Gait training;Stair training;Therapeutic activities;Therapeutic exercise;Neuromuscular re-education;Balance training   PT Next Visit Plan floor ball bins while on red foam mat and card sorting activities; consider trying amb with ball  toss over shoulder   PT Home Exercise Plan VOR in sitting 45 second reps with conflicting background, semi-tandem progressions and feet together on foam with horizontal and vertical head turns and body turns, active eye movements between 2 targets   Consulted and Agree with Plan of Care Patient      Patient will benefit from skilled therapeutic intervention in order to improve the following deficits and impairments:  Decreased balance, Difficulty walking, Decreased range of motion, Cardiopulmonary status limiting activity, Decreased coordination, Decreased mobility, Dizziness, Decreased strength  Visit Diagnosis: Dizziness and giddiness  Difficulty in walking, not elsewhere classified     Problem List Patient Active Problem List   Diagnosis Date Noted  . Asthma 04/17/2017  . Age-related osteoporosis without current pathological fracture 10/16/2016  . COPD type A ([place]) 04/19/2015  . Pulmonary nodules 03/12/2015  . Essential hypertension 09/10/2014  . HLD (hyperlipidemia) 09/10/2014  . Gastroesophageal reflux disease without esophagitis 09/10/2014  . Arthritis 09/10/2014   Lady Deutscher PT, DPT [phone] Lady Deutscher 07/24/2017, 3:43 PM  [place] MAIN [facility] SERVICES [address] [place], [place], [address] Phone: [phone]    Fax:  [phone]  Name: Julie Davies MRN: [id number] Date of Birth: 1927/07/01

## 2017-07-30 ENCOUNTER — Telehealth: Payer: Self-pay | Admitting: Internal Medicine

## 2017-07-30 NOTE — Telephone Encounter (Signed)
Patient wanted [facility] To have access to her medical records between 07/31/15 and 07/30/17. She left form to be faxed to # [phone].

## 2017-07-31 ENCOUNTER — Encounter: Payer: Self-pay | Admitting: Physical Therapy

## 2017-07-31 ENCOUNTER — Ambulatory Visit: Payer: Medicare Other | Attending: Internal Medicine | Admitting: Physical Therapy

## 2017-07-31 DIAGNOSIS — R262 Difficulty in walking, not elsewhere classified: Secondary | ICD-10-CM | POA: Insufficient documentation

## 2017-07-31 DIAGNOSIS — R42 Dizziness and giddiness: Secondary | ICD-10-CM | POA: Insufficient documentation

## 2017-07-31 NOTE — Therapy (Signed)
[place] MAIN [facility] SERVICES [address] [place], [place], [address] Phone: [phone]   Fax:  [phone]  Physical Therapy Treatment  Patient Details  Name: Julie Davies MRN: [id number] Date of Birth: [birth year] No Data Recorded  Encounter Date: 07/31/2017  PT End of Session - 07/31/17 1347    Visit Number  7    Number of Visits  9    Date for PT Re-Evaluation  08/17/17    Authorization Type  needs G codes    Authorization Time Period  7/10    PT Start Time  [place] During Treatment  Gait belt    Activity Tolerance  Patient tolerated treatment well    Behavior During Therapy  [facility] for tasks assessed/performed       Past Medical History:  Diagnosis Date  . Allergy   . Arthritis   . Asthma   . Basal cell carcinoma of skin   . Chicken pox   . GERD (gastroesophageal reflux disease)   . History of kidney stones   . Pulmonary embolism on left [facility])     Past Surgical History:  Procedure Laterality Date  . APPENDECTOMY  1935  . DILATION AND CURETTAGE OF UTERUS  1975  . NEPHRECTOMY Right 1975  . REPLACEMENT TOTAL KNEE BILATERAL  2013  . VEIN LIGATION AND STRIPPING Left 1971    There were no vitals filed for this visit.  Subjective Assessment - 08/06/17 1923    Subjective  Patient states she went to her exercise class, folded clothes and did some dishes and did not have any difficulty and no dizziness with these activities this date.     Pertinent History  Patient reports she began having dizziness episodes off and on for years. Patient states it has gotten worse this past year. Patient reports she is getting episodes Patient reports riding in a car does not bother her or bring on her symptoms. Patient reports she feels she is getting tunnel vision, vertigo, unsteadiness, lightheadedness. Patient has not been to an ENT physician in regards to her symptoms. Patient reports she has been to the cardiologist and  had a chemical stress test, echocardiogram. Patient was told that she did not have a cardiac reason for her dizziness symptoms. Patient had MRI of spine and reports she has "narrowing" and "the bottom part is almost fused".    Diagnostic tests  echocardiogram, cardiac stress test    Patient Stated Goals  to reduce dizzinesss and improve balance    Currently in Pain?  Yes    Pain Descriptors / Indicators  Sore          Neuromuscular Re-education:  Card Sorting Activity:  Patient performed deck of card sorting activity on mat table sorting by numbers and then turning to place cards on low chair to incorporate horizontal and vertical head turning and body turns.  Patient reports 4/10 dizziness with this activity.   Ball Sorting Activity: On firm surface, performed transferring multicolored balls from bin placed on floor to another bin placed 180 degrees on the other side of patient while patient visually tracks the ball so that patient is required to turn 180 degrees Left and Right to turn to bend over and pick and place balls in the bins with CGA.  Patient reports 3/10 dizziness.  Repeated activity and patient reports it was easier the second time.   Ball toss over shoulder: Patient static standing with ball toss over shoulder  with return catch over opposite shoulder. Then patient performed multiple 3' trials of forward and ambulation while tossing ball over one shoulder with return catch over opposite shoulder with CGA. Patient performed multiple 63' trials of forward  ambulation while tossing ball over one shoulder with return catch over opposite shoulder varying the ball position to head, shoulder and waist level to promote head turning and tilting. Patient reports increase in dizziness and imbalance with this activity. Patient with several episodes of veering and decreased cadence with this activity requiring CGA.     PT Education - 07/31/17 1347    Education provided  Yes     Person(s) Educated  Patient    Methods  Explanation;Demonstration    Comprehension  Verbalized understanding          PT Long Term Goals - 07/24/17 1344      PT LONG TERM GOAL #1   Title  Patient will be independent with home exercise program for self management.     Time  4    Period  Weeks    Status  Achieved      PT LONG TERM GOAL #2   Title  Patient will demonstrate reduced falls risk as evidenced by Dynamic Gait Index (DGI) >19/24.    Baseline  scored 9/24 on 06/22/17; scored 20/24 on 07/24/17    Time  8    Period  Weeks    Status  Achieved      PT LONG TERM GOAL #3   Title  Patient will reduce falls risk as indicated by Activities Specific Balance Confidence Scale (ABC) >67%.    Baseline  scored 41.5% on 06/22/17; scored 49.4% on 07/24/17    Time  8    Period  Weeks    Status  Partially Met      PT LONG TERM GOAL #4   Title  Patient will reduce perceived disability to low levels as indicated by <40 on Dizziness Handicap Inventory.    Baseline  scored 50/100 moderate perception on 06/22/17; scored 34/100 on 07/24/17    Time  8    Period  Weeks    Status  Achieved      PT LONG TERM GOAL #5   Title  Patient will report 50% improvement or greater in regards to her symptoms of dizziness and imbalance in order to be able to participate in community and home activities safely.    Time  4    Period  Weeks    Status  New    Target Date  08/21/17            Plan - 08/06/17 1921    Clinical Impression Statement  Patient challenged by ambulation with ball toss over shoulder activity as well as with card and ball sorting activities this date. Patient improved with practice but did not mild veering and imbalance with ambulation with ball toss activity. Will plan on repeating these activities and working on progressions next session. Encouraged patient to continue to follow-up as indicated.     Rehab Potential  Fair    PT Frequency  1x / week    PT Duration  8 weeks    PT  Treatment/Interventions  Vestibular;Patient/family education;Canalith Repostioning;Gait training;Stair training;Therapeutic activities;Therapeutic exercise;Neuromuscular re-education;Balance training    PT Next Visit Plan  floor ball bins while on red foam mat and card sorting activities; forward and retro amb with ball toss over shoulder    PT Home Exercise Plan  VOR in sitting  45 second reps with conflicting background, semi-tandem progressions and feet together on foam with horizontal and vertical head turns and body turns, active eye movements between 2 targets    Consulted and Agree with Plan of Care  Patient       Patient will benefit from skilled therapeutic intervention in order to improve the following deficits and impairments:  Decreased balance, Difficulty walking, Decreased range of motion, Cardiopulmonary status limiting activity, Decreased coordination, Decreased mobility, Dizziness, Decreased strength  Visit Diagnosis: Dizziness and giddiness  Difficulty in walking, not elsewhere classified     Problem List Patient Active Problem List   Diagnosis Date Noted  . Asthma 04/17/2017  . Age-related osteoporosis without current pathological fracture 10/16/2016  . COPD type A ([place]) 04/19/2015  . Pulmonary nodules 03/12/2015  . Essential hypertension 09/10/2014  . HLD (hyperlipidemia) 09/10/2014  . Gastroesophageal reflux disease without esophagitis 09/10/2014  . Arthritis 09/10/2014   Lady Deutscher PT, DPT [phone] Lady Deutscher 07/31/2017, 1:50 PM  [place] MAIN [facility] SERVICES [address] [place], [place], [address] Phone: [phone]   Fax:  [phone]  Name: Ovetta Bazzano MRN: [id number] Date of Birth: 09/04/1927

## 2017-08-06 ENCOUNTER — Telehealth: Payer: Self-pay | Admitting: Gastroenterology

## 2017-08-06 ENCOUNTER — Other Ambulatory Visit: Payer: Self-pay

## 2017-08-06 MED ORDER — DEXLANSOPRAZOLE 30 MG PO CPDR: 30.0000 mg | DELAYED_RELEASE_CAPSULE | Freq: Every day | ORAL | 3 refills | Status: DC

## 2017-08-06 NOTE — Telephone Encounter (Signed)
Dexilant 30 Patient called and needs a rX for it [employer]. [address] or [facility] if 90 day supply. Please call patient to talk to her.

## 2017-08-07 ENCOUNTER — Telehealth: Payer: Self-pay | Admitting: Gastroenterology

## 2017-08-07 ENCOUNTER — Other Ambulatory Visit: Payer: Self-pay

## 2017-08-07 ENCOUNTER — Ambulatory Visit: Payer: Medicare Other | Admitting: Physical Therapy

## 2017-08-07 MED ORDER — DEXLANSOPRAZOLE 30 MG PO CPDR: 30.0000 mg | DELAYED_RELEASE_CAPSULE | Freq: Every day | ORAL | 0 refills | Status: DC

## 2017-08-07 NOTE — Telephone Encounter (Signed)
Call after 3 please. Patient would like for you to call her today regarding a RX that you called in for [place]. [facility] requires 90 days. Please call her to discuss what you need to do.

## 2017-08-11 DIAGNOSIS — M47896 Other spondylosis, lumbar region: Secondary | ICD-10-CM | POA: Diagnosis not present

## 2017-08-11 DIAGNOSIS — M25551 Pain in right hip: Secondary | ICD-10-CM | POA: Diagnosis not present

## 2017-08-11 DIAGNOSIS — M5416 Radiculopathy, lumbar region: Secondary | ICD-10-CM | POA: Diagnosis not present

## 2017-08-11 DIAGNOSIS — M7918 Myalgia, other site: Secondary | ICD-10-CM | POA: Diagnosis not present

## 2017-08-11 DIAGNOSIS — M48061 Spinal stenosis, lumbar region without neurogenic claudication: Secondary | ICD-10-CM | POA: Diagnosis not present

## 2017-08-14 ENCOUNTER — Encounter: Payer: Self-pay | Admitting: Physical Therapy

## 2017-08-14 ENCOUNTER — Ambulatory Visit: Payer: Medicare Other | Admitting: Physical Therapy

## 2017-08-14 DIAGNOSIS — R262 Difficulty in walking, not elsewhere classified: Secondary | ICD-10-CM | POA: Diagnosis not present

## 2017-08-14 DIAGNOSIS — R42 Dizziness and giddiness: Secondary | ICD-10-CM

## 2017-08-14 NOTE — Therapy (Signed)
[place] [facility] SERVICES [address] [place], [place], [address] Phone: [phone]   Fax:  [phone]  Physical Therapy Treatment  Patient Details  Name: Julie Davies MRN: [id number] Date of Birth: 1926/11/08 No Data Recorded  Encounter Date: 08/14/2017  PT End of Session - 08/14/17 1352    Visit Number  8    Number of Visits  9    Date for PT Re-Evaluation  08/17/17    Authorization Type  needs G codes    Authorization Time Period  8/10    PT Start Time  1346    PT Stop Time  1440    PT Time Calculation (min)  54 min    Equipment Utilized During Treatment  Gait belt    Activity Tolerance  Patient tolerated treatment well    Behavior During Therapy  WFL for tasks assessed/performed       Past Medical History:  Diagnosis Date  . Allergy   . Arthritis   . Asthma   . Basal cell carcinoma of skin   . Chicken pox   . GERD (gastroesophageal reflux disease)   . History of kidney stones   . Pulmonary embolism on left [facility])     Past Surgical History:  Procedure Laterality Date  . APPENDECTOMY  1935  . DILATION AND CURETTAGE OF UTERUS  1975  . NEPHRECTOMY Right 1975  . REPLACEMENT TOTAL KNEE BILATERAL  2013  . VEIN LIGATION AND STRIPPING Left 1971    There were no vitals filed for this visit.  Subjective Assessment - 08/14/17 1349    Subjective  Patient states she is feeling better. Patient states she has been changing her diet per her GI physician's recommendations.     Pertinent History  Patient reports she began having dizziness episodes off and on for years. Patient states it has gotten worse this past year. Patient reports she is getting episodes Patient reports riding in a car does not bother her or bring on her symptoms. Patient reports she feels she is getting tunnel vision, vertigo, unsteadiness, lightheadedness. Patient has not been to an ENT physician in regards to her symptoms. Patient reports she has been to  the cardiologist and had a chemical stress test, echocardiogram. Patient was told that she did not have a cardiac reason for her dizziness symptoms. Patient had MRI of spine and reports she has "narrowing" and "the bottom part is almost fused".    Diagnostic tests  echocardiogram, cardiac stress test    Patient Stated Goals  to reduce dizzinesss and improve balance         Neuromuscular Re-education:  VOR x 1 exercise: Patient performed VOR X 1 horizontal and vertical in standing 2 reps of 1 minute each with conflicting background with the target being a playing card. Patient reports mild dizziness symptoms with this exercise.   Diona Foley toss over shoulder: Patient performed multiple 35' trials of forward and retro ambulation while tossing ball over one shoulder with return catch over opposite shoulder with CGA. Patient reports increase in dizziness with this activity. Patient performed multiple 38' trials of forward and retro ambulation while tossing ball over one shoulder with return catch over opposite shoulder varying the ball position to head, shoulder and waist level to promote head turning and tilting.   Note: patient reports that her dizziness symptoms have improved but that she does still get some dizziness when she is bending over to fold and do laundry. In addition if  she stands too long, it brings on fatigue more so than dizziness per patient reports.   Laundry Exercise: Patient reporting that bending over to do move her laundry from her front-loader washer and dryer and then folding items on top of the dryer cause her to get dizziness at home. Patient demonstrated how she performs these activities at home using our washer/dryer in the clinic. Patient required cuing for proper technique with squatting and discussed instead of taking one piece of laundry out of the dryer and folding it at a time to take several pieces out and set them on top of the washer. Then she could stand and fold  those items and then repeat thus cutting down on the amount of bending and stopping over.   Habituation exercise: Patient performed bending with return upright for 1-2 minute and this recreated patient's symptoms of dizziness but noted that patient had mild shortness of breath and discomfort. All of these symptoms eased quickly with sitting rest break. Repeated this activity for 1 minute while wearing a finger pulse oximeter and noted that her heart rate was 100-103 bpm for the first 30 seconds and 95-97 bpm for the last 30 seconds and oxygen sats remained 98-100%. Patient reports she has a large hiatal hernia. Discussed with patient that concerned that the bending could be aggravating her hiatal hernia which in turn might be contributing to her dizziness with this activity. Recommended to patient that she limit forward bending activities and utilize her reacher to see if this impacts her dizziness symptoms. Patient reports 2/10 dizziness    PT Education - 08/14/17 1352    Education provided  Yes    Education Details  discussed discharge plans, discussed what activities were recreating her symptoms at home, discussed strategies for being able to do her laundry at home with decreased bending and improved form.     Person(s) Educated  Patient    Methods  Explanation;Demonstration    Comprehension  Verbalized understanding;Returned demonstration          PT Long Term Goals - 07/24/17 1344      PT LONG TERM GOAL #1   Title  Patient will be independent with home exercise program for self management.     Time  4    Period  Weeks    Status  Achieved      PT LONG TERM GOAL #2   Title  Patient will demonstrate reduced falls risk as evidenced by Dynamic Gait Index (DGI) >19/24.    Baseline  scored 9/24 on 06/22/17; scored 20/24 on 07/24/17    Time  8    Period  Weeks    Status  Achieved      PT LONG TERM GOAL #3   Title  Patient will reduce falls risk as indicated by Activities Specific  Balance Confidence Scale (ABC) >67%.    Baseline  scored 41.5% on 06/22/17; scored 49.4% on 07/24/17    Time  8    Period  Weeks    Status  Partially Met      PT LONG TERM GOAL #4   Title  Patient will reduce perceived disability to low levels as indicated by <40 on Dizziness Handicap Inventory.    Baseline  scored 50/100 moderate perception on 06/22/17; scored 34/100 on 07/24/17    Time  8    Period  Weeks    Status  Achieved      PT LONG TERM GOAL #5   Title  Patient will  report 50% improvement or greater in regards to her symptoms of dizziness and imbalance in order to be able to participate in community and home activities safely.    Time  4    Period  Weeks    Status  New    Target Date  08/21/17            Plan - 08/14/17 1352    Clinical Impression Statement  Patient reports improvement in her balance and states that she has been able to return to all of her classes at the [facility]. Patient reports she has a large hiatal hernia. Discussed with patient that concerned that the bending could be aggravating her hiatal hernia which in turn might be contributing to her dizziness with this activity. Recommended to patient that she limit forward bending activities and utilize her reacher to see if this impacts her dizziness symptoms. Discussed with patient plan of care and discussed discharging from PT services at next visit. Will plan on finalizing home exercise program next session.     Rehab Potential  Fair    PT Frequency  1x / week    PT Duration  8 weeks    PT Treatment/Interventions  Vestibular;Patient/family education;Canalith Repostioning;Gait training;Stair training;Therapeutic activities;Therapeutic exercise;Neuromuscular re-education;Balance training    PT Next Visit Plan  floor ball bins while on red foam mat and card sorting activities; forward and retro amb with ball toss over shoulder    PT Home Exercise Plan  VOR in sitting 45 second reps with conflicting  background, semi-tandem progressions and feet together on foam with horizontal and vertical head turns and body turns, active eye movements between 2 targets    Consulted and Agree with Plan of Care  Patient       Patient will benefit from skilled therapeutic intervention in order to improve the following deficits and impairments:  Decreased balance, Difficulty walking, Decreased range of motion, Cardiopulmonary status limiting activity, Decreased coordination, Decreased mobility, Dizziness, Decreased strength  Visit Diagnosis: Dizziness and giddiness  Difficulty in walking, not elsewhere classified     Problem List Patient Active Problem List   Diagnosis Date Noted  . Asthma 04/17/2017  . Age-related osteoporosis without current pathological fracture 10/16/2016  . COPD type A ([place]) 04/19/2015  . Pulmonary nodules 03/12/2015  . Essential hypertension 09/10/2014  . HLD (hyperlipidemia) 09/10/2014  . Gastroesophageal reflux disease without esophagitis 09/10/2014  . Arthritis 09/10/2014   Lady Deutscher PT, DPT [id number] Lady Deutscher 08/15/2017, 1:10 PM  [place] [facility] SERVICES [address] [place], [place], [address] Phone: [phone]   Fax:  [phone]  Name: Julie Davies MRN: [id number] Date of Birth: 04/15/1927

## 2017-08-15 ENCOUNTER — Ambulatory Visit (INDEPENDENT_AMBULATORY_CARE_PROVIDER_SITE_OTHER): Payer: Medicare Other | Admitting: Gastroenterology

## 2017-08-15 ENCOUNTER — Encounter: Payer: Self-pay | Admitting: Gastroenterology

## 2017-08-15 VITALS — BP 111/75 | HR 78 | Ht 67.0 in | Wt 189.4 lb

## 2017-08-15 DIAGNOSIS — K219 Gastro-esophageal reflux disease without esophagitis: Secondary | ICD-10-CM | POA: Diagnosis not present

## 2017-08-15 DIAGNOSIS — R140 Abdominal distension (gaseous): Secondary | ICD-10-CM | POA: Diagnosis not present

## 2017-08-15 DIAGNOSIS — I25118 Atherosclerotic heart disease of native coronary artery with other forms of angina pectoris: Secondary | ICD-10-CM

## 2017-08-15 NOTE — Progress Notes (Signed)
Julie Bellows MD, MRCP(U.K) [address]  [place]  [place], [place] [address]  Main: [phone]  Fax: [phone]   Primary Care Physician: Jearld Fenton, NP  Primary Gastroenterologist:  Dr. Jonathon Davies   Chief Complaint  Patient presents with  . Follow-up    HPI: Julie Davies is a 83 y.o. female   Summary of history :  She is here today to follow up to her last and initial  Visit on 07/17/17 when she was referred for GERD,abdominal pain , bloating and constipation . GERD for many years, described as a chest pressure, food coming up in her sleep, burning in her chest and throat . to take pantoprazole in the am before bfast +/- gaviscon.Large hiatal hernia per last CT scan of her chest.She has had constipation all her life , was taking metamucil , gas increased since starting the metamucil. No weight loss.   Interval history   07/17/2017-  08/15/2017   Symptoms of GERD is much better overall since the last visit. Has some stomach gurgling. Has a bowel movement daily, soft , taking miralax. Seems to work well. Gurgling is also better after changing her diet , tried a "low acid diet". At times she wakes up in the morning wakes up with a quesy sensation in her stomach but when she eats feels better in the morning . Sleeping better now due to less reflux. She says she is eating different now and hence has lost some weight . She is not concerned. She is consuming less greasy foods , less sugar.   Current Outpatient Medications  Medication Sig Dispense Refill  . acetaminophen (TYLENOL) 500 MG tablet Take 500 mg by mouth as needed.    Marland Kitchen albuterol (PROVENTIL HFA;VENTOLIN HFA) 108 (90 Base) MCG/ACT inhaler Inhale 2 puffs into the lungs every 4 (four) hours as needed for wheezing or shortness of breath. 54 g 3  . alendronate (FOSAMAX) 70 MG tablet TAKE 1 TABLET EVERY 7 DAYS WITH A FULL GLASS OF WATER ON AN EMPTY STOMACH 12 tablet 2  . Alum Hydroxide-Mag Carbonate (GAVISCON EXTRA  STRENGTH) 160-105 MG CHEW Chew 2 tablets by mouth as needed.    Marland Kitchen aspirin 81 MG tablet Take 81 mg by mouth daily.    . Cholecalciferol (VITAMIN D) 2000 units CAPS Take 1 capsule by mouth daily.    Marland Kitchen Dexlansoprazole 30 MG capsule Take 1 capsule (30 mg total) daily by mouth. 90 capsule 0  . docusate sodium (COLACE) 100 MG capsule Take 100 mg by mouth 2 (two) times daily as needed for mild constipation.    . fluticasone-salmeterol (ADVAIR HFA) 115-21 MCG/ACT inhaler Inhale 2 puffs into the lungs 2 (two) times daily. 3 Inhaler 3  . gabapentin (NEURONTIN) 300 MG capsule Take 300 mg by mouth 2 (two) times daily.     . Ginger, Zingiber officinalis, (GINGER EXTRACT) 250 MG CAPS Take 250 mg by mouth 2 (two) times daily.    Donnie Aho (GLUCOSAMINE MSM COMPLEX) TABS Take 1 tablet by mouth 2 (two) times daily.    Marland Kitchen levocetirizine (XYZAL) 5 MG tablet Take 1 tablet (5 mg total) by mouth every evening. 30 tablet 0  . Misc Natural Products (TART CHERRY ADVANCED) CAPS Take 2 capsules by mouth daily.    . Multiple Vitamins-Minerals (ONE DAILY MULTIVITAMIN WOMEN PO) Take 1 tablet by mouth daily.    . Olopatadine HCl (PATADAY) 0.2 % SOLN INSTILL 1 DROP TO EYE DAILY 7.5 mL 1  . pantoprazole (  PROTONIX) 40 MG tablet Take 1 tablet (40 mg total) by mouth daily. (Patient taking differently: Take 80 mg by mouth daily. ) 90 tablet 3  . Polyethylene Glycol 3350 GRAN 17 g by Does not apply route as needed.    . predniSONE (DELTASONE) 20 MG tablet Take 1 tablet (20 mg total) by mouth daily with breakfast. 5 days 5 tablet 0  . SENNA CO by Combination route.    . simvastatin (ZOCOR) 20 MG tablet Take 1 tablet (20 mg total) by mouth daily. 90 tablet 3  . TURMERIC PO Take 300 mg by mouth daily.    . Wheat Dextrin (BENEFIBER) POWD Take 4 g by mouth as needed.     No current facility-administered medications for this visit.     Allergies as of 08/15/2017 - Review Complete 08/15/2017  Allergen Reaction Noted   . Tramadol Other (See Comments) 01/02/2017    ROS:  General: Negative for anorexia, weight loss, fever, chills, fatigue, weakness. ENT: Negative for hoarseness, difficulty swallowing , nasal congestion. CV: Negative for chest pain, angina, palpitations, dyspnea on exertion, peripheral edema.  Respiratory: Negative for dyspnea at rest, dyspnea on exertion, cough, sputum, wheezing.  GI: See history of present illness. GU:  Negative for dysuria, hematuria, urinary incontinence, urinary frequency, nocturnal urination.  Endo: Negative for unusual weight change.    Physical Examination:   BP 111/75 (BP Location: Right Arm, Patient Position: Sitting, Cuff Size: Large)   Pulse 78   Ht 5\' 7"  (1.702 m)   Wt 189 lb 6.4 oz (85.9 kg)   BMI 29.66 kg/m   General: Well-nourished, well-developed in no acute distress.  Eyes: No icterus. Conjunctivae pink. Mouth: Oropharyngeal mucosa moist and pink , no lesions erythema or exudate. Lungs: Clear to auscultation bilaterally. Non-labored. Heart: Regular rate and rhythm, no murmurs rubs or gallops.  Abdomen: Bowel sounds are normal, nontender, nondistended, no hepatosplenomegaly or masses, no abdominal bruits or hernia , no rebound or guarding.   Extremities: No lower extremity edema. No clubbing or deformities. Neuro: Alert and oriented x 3.  Grossly intact. Skin: Warm and dry, no jaundice.   Psych: Alert and cooperative, normal mood and affect.   Imaging Studies: No results found.  Assessment and Plan:   Julie Davies is a 83 y.o. y/o female  here to follow up for GERD,constipation , gas ,bloating . GERD Likely due to large underlying hiatal hernia .    Plan  1.Continue Dexilant 2. Continue GERD  life style changes. 3. LOW FODMAP diet   4. If she is not doing well at the next visit or symptoms do not continue to stay well controlled as it is presently then can always consider EGD and abdominal imaging.    Dr Julie Bellows  MD,MRCP  [facility]) Follow up in 4 months

## 2017-08-21 ENCOUNTER — Ambulatory Visit: Payer: Medicare Other | Admitting: Physical Therapy

## 2017-08-24 ENCOUNTER — Ambulatory Visit: Payer: Medicare Other | Admitting: Physical Therapy

## 2017-08-24 ENCOUNTER — Encounter: Payer: Self-pay | Admitting: Physical Therapy

## 2017-08-24 DIAGNOSIS — R262 Difficulty in walking, not elsewhere classified: Secondary | ICD-10-CM | POA: Diagnosis not present

## 2017-08-24 DIAGNOSIS — R42 Dizziness and giddiness: Secondary | ICD-10-CM

## 2017-08-24 NOTE — Therapy (Signed)
[place] MAIN [facility] SERVICES [address] [place], [place], [address] Phone: [phone]   Fax:  [phone]  Physical Therapy Treatment/Discharge Summary  Patient Details  Name: Julie Davies MRN: [id number] Date of Birth: 01-23-1927 No Data Recorded  Encounter Date: 08/24/2017  PT End of Session - 08/24/17 1257    Visit Number  9    Number of Visits  9    Date for PT Re-Evaluation  08/17/17    Authorization Type  needs G codes    Authorization Time Period  9/10    PT Start Time  1258    PT Stop Time  1340    PT Time Calculation (min)  42 min    Equipment Utilized During Treatment  --    Activity Tolerance  Patient tolerated treatment well    Behavior During Therapy  [facility] for tasks assessed/performed       Past Medical History:  Diagnosis Date  . Allergy   . Arthritis   . Asthma   . Basal cell carcinoma of skin   . Chicken pox   . GERD (gastroesophageal reflux disease)   . History of kidney stones   . Pulmonary embolism on left [facility])     Past Surgical History:  Procedure Laterality Date  . APPENDECTOMY  1935  . DILATION AND CURETTAGE OF UTERUS  1975  . NEPHRECTOMY Right 1975  . REPLACEMENT TOTAL KNEE BILATERAL  2013  . VEIN LIGATION AND STRIPPING Left 1971    There were no vitals filed for this visit.  Subjective Assessment - 08/24/17 1257    Subjective  Patient states she has done really well this past week. Patient states she has not had any episodes of dizziness this past week and she states she feels really well. Patient states that now she "is able to recognize when something is about to come on" and she knows how to handle things. Patient states her stomach issues have also improved which she feels has helped.     Pertinent History  Patient reports she began having dizziness episodes off and on for years. Patient states it has gotten worse this past year. Patient reports she is getting episodes Patient reports  riding in a car does not bother her or bring on her symptoms. Patient reports she feels she is getting tunnel vision, vertigo, unsteadiness, lightheadedness. Patient has not been to an ENT physician in regards to her symptoms. Patient reports she has been to the cardiologist and had a chemical stress test, echocardiogram. Patient was told that she did not have a cardiac reason for her dizziness symptoms. Patient had MRI of spine and reports she has "narrowing" and "the bottom part is almost fused".    Diagnostic tests  echocardiogram, cardiac stress test    Patient Stated Goals  to reduce dizzinesss and improve balance    Currently in Pain?  Yes    Pain Location  Neck    Pain Orientation  Right;Lateral    Pain Descriptors / Indicators  Sore    Pain Type  Acute pain    Pain Onset  Today       Neuromuscular Re-education:   Active eye movement between two targets:  Patient performed in standing active eye movements between two targets horizontal with conflicting background 3 reps of 60 seconds each. Patient demonstrates good technique.  Patient reports she did not have any dizziness with this exercise today.   Slow Marching: Patient performed on firm surface, slow  marching without head turns with supervision. Patient reaching to touch for support multiple times during these activities due to loss of balance.  Patient was able to do 2 second holds but working towards 3 second holds.   Laundry Sorting: Last session, worked on strategies to try to decrease patient's symptoms during laundry activities at home as patient reported that this was the one activity that she was still having difficulties with at home.  Patient reports she was able to do the laundry multiple times this past week using the strategies we discussed last session and that she was able to perform laundry activities without any dizziness this past week.   In addition, patient reports the pursed lip breathing technique taught last  session has helped her better be able to tolerate activities and her exercises classes.  Patient states she was able to do numerous errands, go shopping and participate in her exercise classes- back exercise class, Tai Chi and Easy Does It-balance exercise at the [facility].   Patient has been able to resume her normal routine.   PT Education - 08/24/17 1257    Education provided  Yes    Education Details  Reviewed final home exercise program; discussed plans for discharge    Person(s) Educated  Patient    Methods  Explanation;Handout    Comprehension  Verbalized understanding          PT Long Term Goals - 08/24/17 1257      PT LONG TERM GOAL #1   Title  Patient will be independent with home exercise program for self management.     Time  4    Period  Weeks    Status  Achieved      PT LONG TERM GOAL #2   Title  Patient will demonstrate reduced falls risk as evidenced by Dynamic Gait Index (DGI) >19/24.    Baseline  scored 9/24 on 06/22/17; scored 20/24 on 07/24/17    Time  8    Period  Weeks    Status  Achieved      PT LONG TERM GOAL #3   Title  Patient will reduce falls risk as indicated by Activities Specific Balance Confidence Scale (ABC) >67%.    Baseline  scored 41.5% on 06/22/17; scored 49.4% on 07/24/17    Time  8    Period  Weeks    Status  Partially Met      PT LONG TERM GOAL #4   Title  Patient will reduce perceived disability to low levels as indicated by <40 on Dizziness Handicap Inventory.    Baseline  scored 50/100 moderate perception on 06/22/17; scored 34/100 on 07/24/17    Time  8    Period  Weeks    Status  Achieved      PT LONG TERM GOAL #5   Title  Patient will report 50% improvement or greater in regards to her symptoms of dizziness and imbalance in order to be able to participate in community and home activities safely.    Time  4    Period  Weeks    Status  Partially Met            Plan - 08/24/17 1258    Clinical Impression  Statement  Patient reports that she has improved overall since starting therapy. Patient states she has now been able to resume her prior exercise classes at the [facility] which include a back exercise class, Tai Chi and the Easy  Does-IT-balance class. Patient reports that she has been able to do laundry chores at home this past week without any dizziness and states that she has not had any dizziness episodes this past week. Patient has met 3/5 goals as set on plan of care and partially met remaining goals. Patient improved from 9/24 to 20/24 on the DGI and from moderate to low perception of handicap on the [facility]. Patient improved her 10 meter walking speed from 0.58 m/sec to 0.83 m/sec. Patient improved from 41.5 to 49.4% on the ABC scale. Reviewed patient's home exercise program this date and provided an additional copy of HEP. Patient plans to continue to participate in community exercise/balance classes and to do her HEP upon discharge. Patient is in agreement with discharge from PT services at this time.     Rehab Potential  Fair    PT Frequency  1x / week    PT Duration  8 weeks    PT Treatment/Interventions  Vestibular;Patient/family education;Canalith Repostioning;Gait training;Stair training;Therapeutic activities;Therapeutic exercise;Neuromuscular re-education;Balance training    PT Next Visit Plan  --    PT Home Exercise Plan  VOR in sitting 45 second reps with conflicting background, semi-tandem progressions and feet together on foam with horizontal and vertical head turns and body turns, active eye movements between 2 targets    Consulted and Agree with Plan of Care  Patient       Patient will benefit from skilled therapeutic intervention in order to improve the following deficits and impairments:  Decreased balance, Difficulty walking, Decreased range of motion, Cardiopulmonary status limiting activity, Decreased coordination, Decreased mobility, Dizziness, Decreased  strength  Visit Diagnosis: Dizziness and giddiness  Difficulty in walking, not elsewhere classified   G-Codes - Sep 06, 2017 1409    Functional Assessment Tool Used (Outpatient Only)  clinical judgment, 10 meter walking speed, ABC, DHI, DGI    Functional Limitation  Mobility: Walking and moving around    Mobility: Walking and Moving Around Current Status [phone]  At least 20 percent but less than 40 percent impaired, limited or restricted    Mobility: Walking and Moving Around Goal Status [phone])  At least 20 percent but less than 40 percent impaired, limited or restricted    Mobility: Walking and Moving Around Discharge Status [phone])  At least 20 percent but less than 40 percent impaired, limited or restricted       Problem List Patient Active Problem List   Diagnosis Date Noted  . Asthma 04/17/2017  . Age-related osteoporosis without current pathological fracture 10/16/2016  . COPD type A ([place]) 04/19/2015  . Pulmonary nodules 03/12/2015  . Essential hypertension 09/10/2014  . HLD (hyperlipidemia) 09/10/2014  . Gastroesophageal reflux disease without esophagitis 09/10/2014  . Arthritis 09/10/2014   Lady Deutscher PT, DPT [phone] Lady Deutscher 09/06/17, 2:33 PM  [place] MAIN [facility] SERVICES [address] [place], [place], [address] Phone: [phone]   Fax:  [phone]  Name: Julie Davies MRN: [id number] Date of Birth: 03-03-1927

## 2017-08-30 DIAGNOSIS — Z85828 Personal history of other malignant neoplasm of skin: Secondary | ICD-10-CM | POA: Diagnosis not present

## 2017-08-30 DIAGNOSIS — Z08 Encounter for follow-up examination after completed treatment for malignant neoplasm: Secondary | ICD-10-CM | POA: Diagnosis not present

## 2017-08-30 DIAGNOSIS — B351 Tinea unguium: Secondary | ICD-10-CM | POA: Diagnosis not present

## 2017-08-30 DIAGNOSIS — L821 Other seborrheic keratosis: Secondary | ICD-10-CM | POA: Diagnosis not present

## 2017-11-05 ENCOUNTER — Other Ambulatory Visit: Payer: Self-pay | Admitting: Gastroenterology

## 2017-11-06 NOTE — Telephone Encounter (Signed)
Patient needs a refill on Dexilant called into [facility]. 90 days if she needs to stay on it.

## 2017-11-27 ENCOUNTER — Encounter: Payer: Self-pay | Admitting: Gastroenterology

## 2017-11-27 ENCOUNTER — Ambulatory Visit (INDEPENDENT_AMBULATORY_CARE_PROVIDER_SITE_OTHER): Payer: Medicare Other | Admitting: Gastroenterology

## 2017-11-27 ENCOUNTER — Encounter (INDEPENDENT_AMBULATORY_CARE_PROVIDER_SITE_OTHER): Payer: Self-pay

## 2017-11-27 VITALS — BP 155/79 | HR 74 | Ht 67.0 in | Wt 192.4 lb

## 2017-11-27 DIAGNOSIS — K5900 Constipation, unspecified: Secondary | ICD-10-CM | POA: Diagnosis not present

## 2017-11-27 DIAGNOSIS — K219 Gastro-esophageal reflux disease without esophagitis: Secondary | ICD-10-CM | POA: Diagnosis not present

## 2017-11-27 NOTE — Progress Notes (Signed)
[name] MD, MRCP(U.K) [address]  [place], [place] [address]  Main: [phone]  Fax: [phone]   Primary Care Physician: Jearld Fenton, NP  Primary Gastroenterologist:  Dr. [name]   Chief Complaint  Patient presents with  . Gastroesophageal Reflux    HPI: Julie Davies is a 84 y.o. female  Summary of history :  She is here today to follow up to her last and initial  Visit on 07/17/17 when she was referred for GERD,abdominal pain , bloating and constipation . GERD for many years, described as a chest pressure, food coming up in her sleep, burning in her chest and throat . to take pantoprazole in the am before bfast +/- gaviscon.Large hiatal hernia per last CT scan of her chest.  Interval history   08/15/2017 -11/27/17   Says that her GERD is under control. Some times has regurgitation. Sometimes hears her stomach rumbling. She did try the Low FODMAP diet . Overall she feels better.   Some cough last few days with a runny nose. Constipation is also doing well , takes her miralax as needed. Overall doing well. Dexilant daily. No weight loss.   Current Outpatient Medications  Medication Sig Dispense Refill  . acetaminophen (TYLENOL) 500 MG tablet Take 500 mg by mouth as needed.    Marland Kitchen albuterol (PROVENTIL HFA;VENTOLIN HFA) 108 (90 Base) MCG/ACT inhaler Inhale 2 puffs into the lungs every 4 (four) hours as needed for wheezing or shortness of breath. 54 g 3  . alendronate (FOSAMAX) 70 MG tablet TAKE 1 TABLET EVERY 7 DAYS WITH A FULL GLASS OF WATER ON AN EMPTY STOMACH 12 tablet 2  . Alum Hydroxide-Mag Carbonate (GAVISCON EXTRA STRENGTH) 160-105 MG CHEW Chew 2 tablets by mouth as needed.    Marland Kitchen aspirin 81 MG tablet Take 81 mg by mouth daily.    . Cholecalciferol (VITAMIN D) 2000 units CAPS Take 1 capsule by mouth daily.    . clobetasol cream (TEMOVATE) 0.05 % Apply topically.    Marland Kitchen Dexlansoprazole 30 MG capsule Take 1 capsule (30 mg total) daily by  mouth. 90 capsule 0  . docusate sodium (COLACE) 100 MG capsule Take 100 mg by mouth 2 (two) times daily as needed for mild constipation.    . fluconazole (DIFLUCAN) 200 MG tablet TAKE 1 TABLET BY MOUTH EVERY WEEK EVERY FRIDAY  2  . fluticasone-salmeterol (ADVAIR HFA) 115-21 MCG/ACT inhaler Inhale 2 puffs into the lungs 2 (two) times daily. 3 Inhaler 3  . gabapentin (NEURONTIN) 300 MG capsule Take 300 mg by mouth 2 (two) times daily.     . Ginger, Zingiber officinalis, (GINGER EXTRACT) 250 MG CAPS Take 250 mg by mouth 2 (two) times daily.    Donnie Aho (GLUCOSAMINE MSM COMPLEX) TABS Take 1 tablet by mouth 2 (two) times daily.    Marland Kitchen levocetirizine (XYZAL) 5 MG tablet Take 1 tablet (5 mg total) by mouth every evening. 30 tablet 0  . Misc Natural Products (TART CHERRY ADVANCED) CAPS Take 2 capsules by mouth daily.    . Multiple Vitamins-Minerals (ONE DAILY MULTIVITAMIN WOMEN PO) Take 1 tablet by mouth daily.    . Olopatadine HCl (PATADAY) 0.2 % SOLN INSTILL 1 DROP TO EYE DAILY 7.5 mL 1  . Polyethylene Glycol 3350 GRAN 17 g by Does not apply route as needed.    . simvastatin (ZOCOR) 20 MG tablet Take 1 tablet (20 mg total) by mouth daily. 90 tablet 3  . TURMERIC PO Take  300 mg by mouth daily.     No current facility-administered medications for this visit.     Allergies as of 11/27/2017 - Review Complete 11/27/2017  Allergen Reaction Noted  . Tramadol Other (See Comments) 01/02/2017    ROS:  General: Negative for anorexia, weight loss, fever, chills, fatigue, weakness. ENT: Negative for hoarseness, difficulty swallowing , nasal congestion. CV: Negative for chest pain, angina, palpitations, dyspnea on exertion, peripheral edema.  Respiratory: Negative for dyspnea at rest, dyspnea on exertion, cough, sputum, wheezing.  GI: See history of present illness. GU:  Negative for dysuria, hematuria, urinary incontinence, urinary frequency, nocturnal urination.  Endo: Negative for  unusual weight change.    Physical Examination:   BP (!) 155/79 (BP Location: Left Arm, Patient Position: Sitting, Cuff Size: Large)   Pulse 74   Ht 5\' 7"  (1.702 m)   Wt 192 lb 6.4 oz (87.3 kg)   BMI 30.13 kg/m   General: Well-nourished, well-developed in no acute distress.  Eyes: No icterus. Conjunctivae pink. Mouth: Oropharyngeal mucosa moist and pink , no lesions erythema or exudate. Lungs: Clear to auscultation bilaterally. Non-labored. Heart: Regular rate and rhythm, no murmurs rubs or gallops.  Abdomen: Bowel sounds are normal, nontender, nondistended, no hepatosplenomegaly or masses, no abdominal bruits or hernia , no rebound or guarding.   Extremities: No lower extremity edema. No clubbing or deformities. Neuro: Alert and oriented x 3.  Grossly intact. Skin: Warm and dry, no jaundice.   Psych: Alert and cooperative, normal mood and affect.   Imaging Studies: No results found.  Assessment and Plan:   Julie Davies is a 84 y.o. y/o female   here to follow up for GERD,constipation , gas ,bloating . GERD Likely due to large underlying hiatal hernia . She is doing well since her last visit. No new concerns.   Plan  1.Continue Dexilant 2. Continue GERD  life style changes. 3. Miralax as needed    Dr [name]  MD,MRCP [facility]) Follow up as needed

## 2018-01-03 IMAGING — CR DG CHEST 2V
3 series · 3 of 3 positions shown · non-contrast
Comparison: Chest CT April 12, 2017 and chest radiograph October 26, 2015.

CLINICAL DATA: Near syncope.  Shortness of breath.

EXAM:
CHEST  2 VIEW

[chest pa]
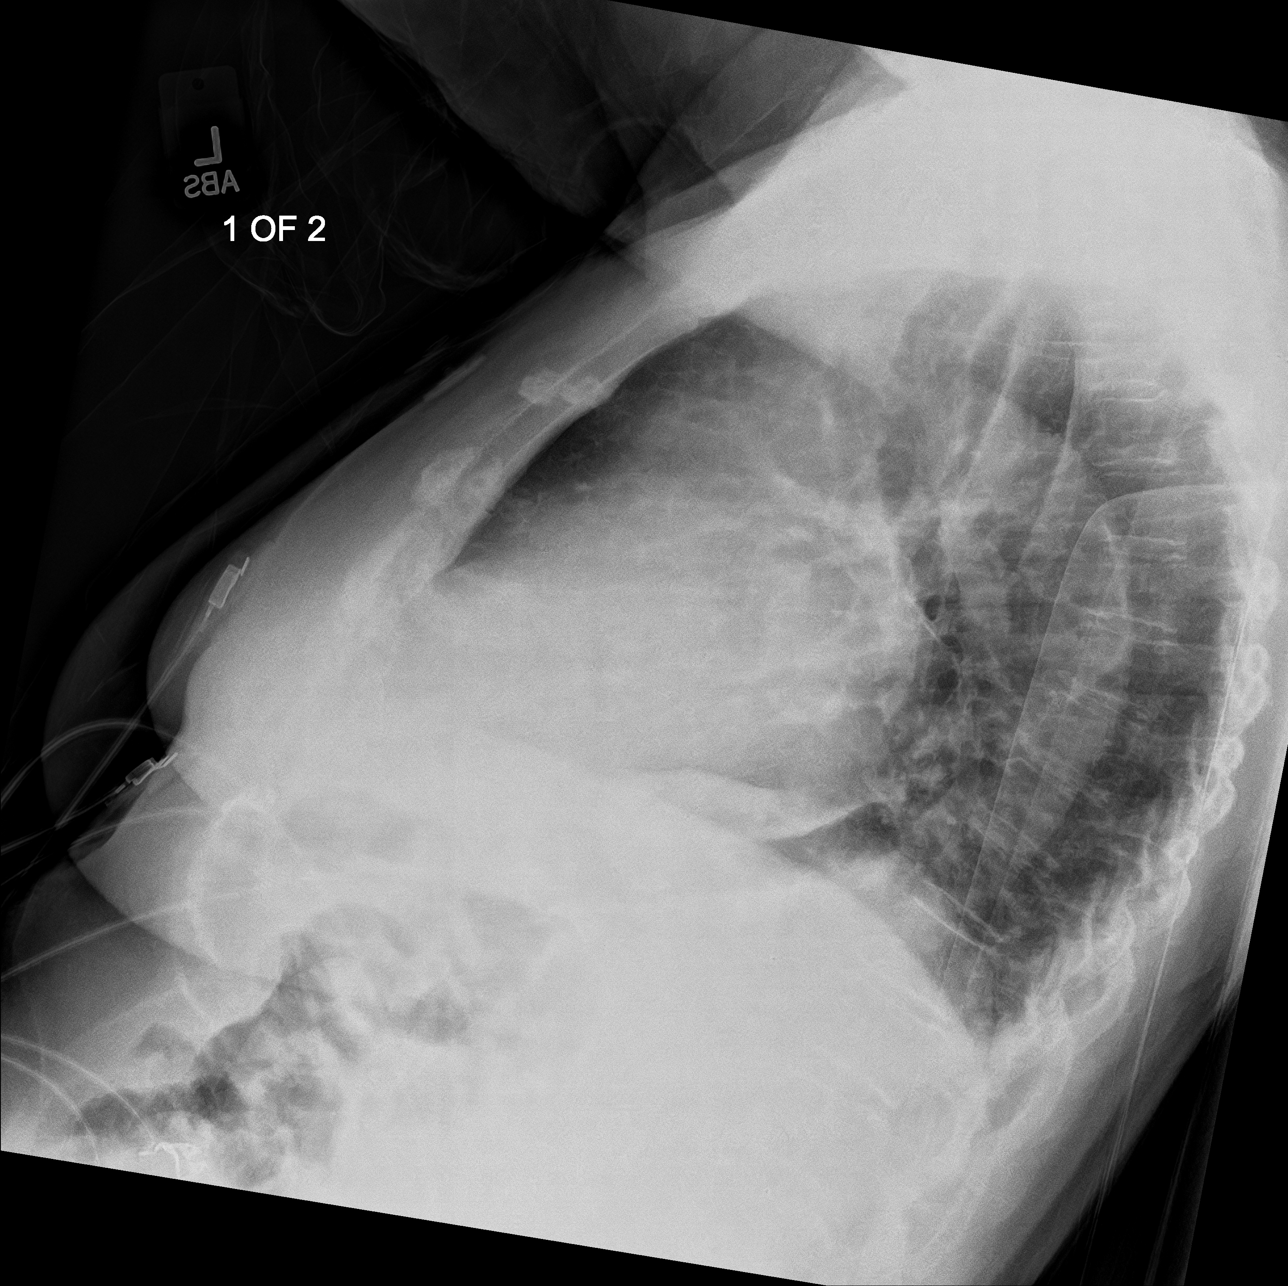

[chest lat]
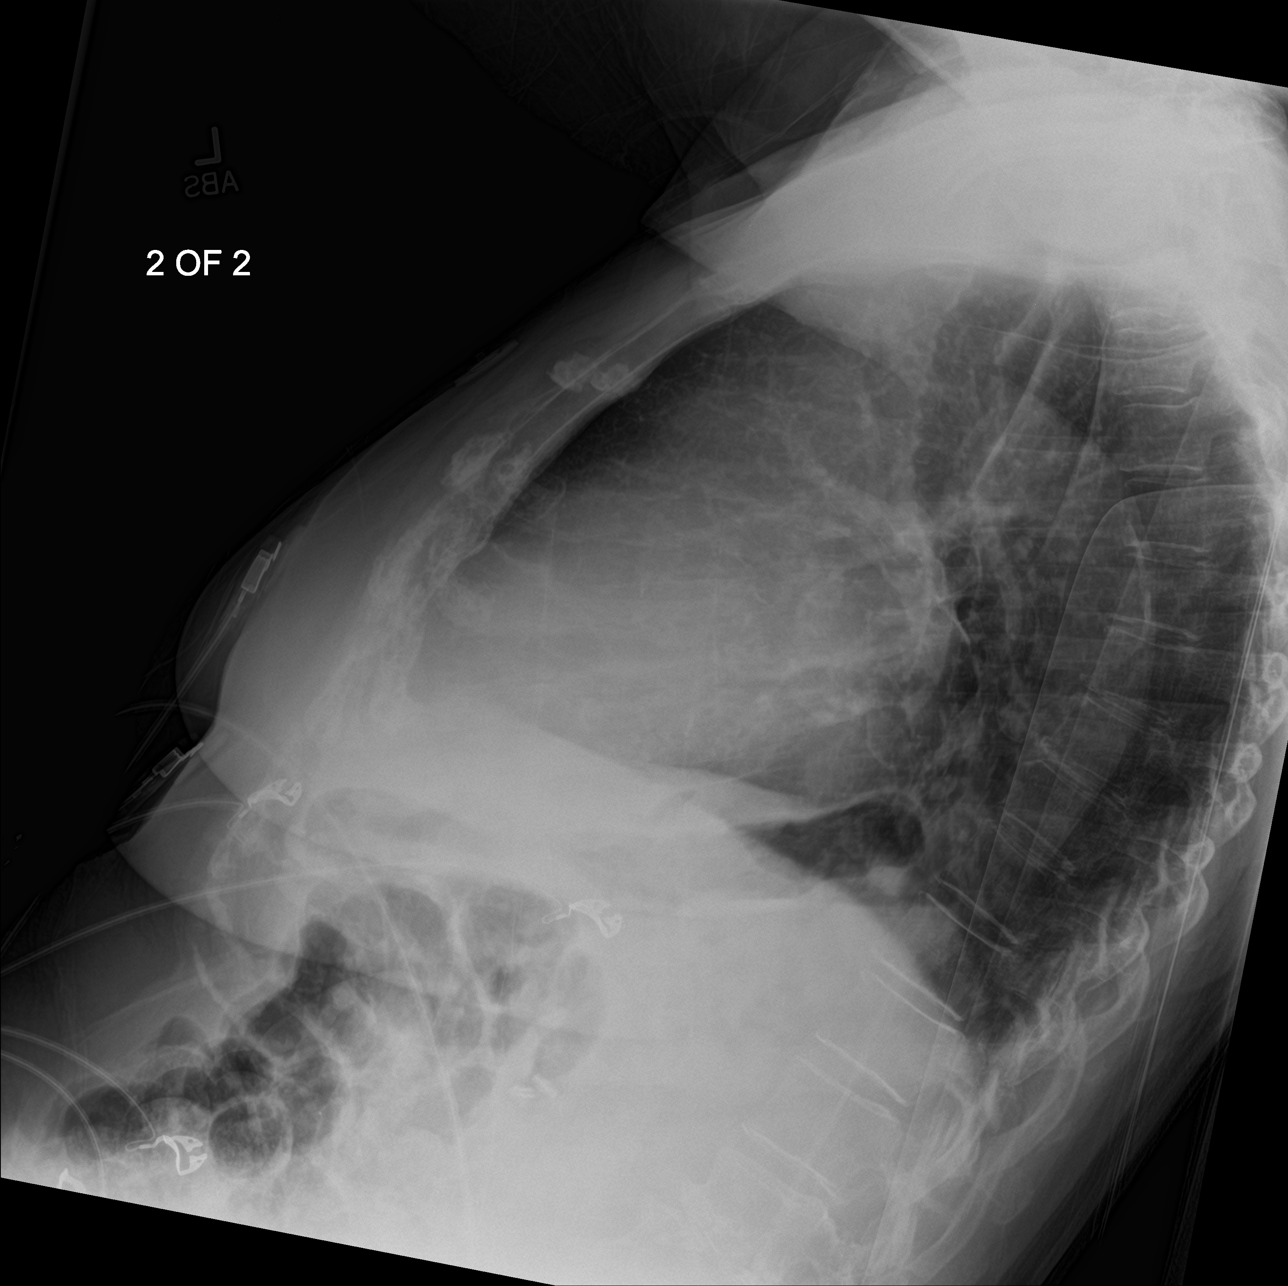

[chest ap]
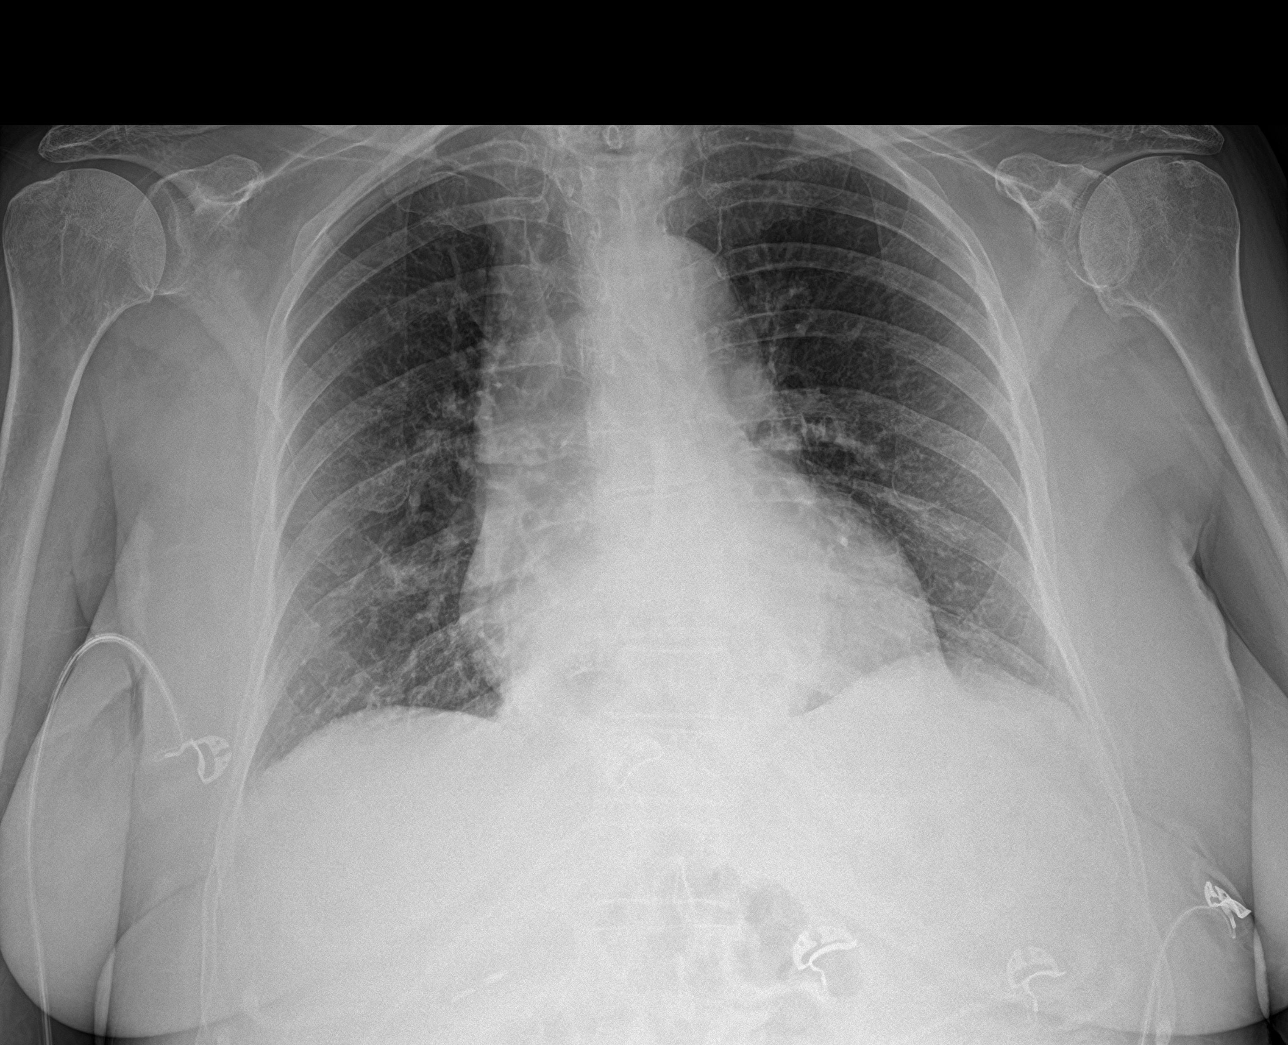

[3 of 3 positions shown; findings below may reference images not displayed]

FINDINGS: There is no edema or consolidation. Heart is upper normal in size
with pulmonary vascularity within normal limits. No adenopathy.
There is degenerative change in the thoracic spine. There is a small
hiatal hernia.
IMPRESSION: No edema or consolidation.  Small hiatal hernia.

## 2018-01-10 DIAGNOSIS — M5416 Radiculopathy, lumbar region: Secondary | ICD-10-CM | POA: Diagnosis not present

## 2018-01-10 DIAGNOSIS — M25551 Pain in right hip: Secondary | ICD-10-CM | POA: Diagnosis not present

## 2018-01-10 DIAGNOSIS — M47896 Other spondylosis, lumbar region: Secondary | ICD-10-CM | POA: Diagnosis not present

## 2018-01-10 DIAGNOSIS — M7918 Myalgia, other site: Secondary | ICD-10-CM | POA: Diagnosis not present

## 2018-01-10 DIAGNOSIS — M48061 Spinal stenosis, lumbar region without neurogenic claudication: Secondary | ICD-10-CM | POA: Diagnosis not present

## 2018-01-23 ENCOUNTER — Ambulatory Visit (INDEPENDENT_AMBULATORY_CARE_PROVIDER_SITE_OTHER): Payer: Medicare Other | Admitting: Internal Medicine

## 2018-01-23 ENCOUNTER — Telehealth: Payer: Self-pay | Admitting: Internal Medicine

## 2018-01-23 ENCOUNTER — Encounter: Payer: Self-pay | Admitting: Internal Medicine

## 2018-01-23 VITALS — BP 118/78 | HR 118 | Temp 97.8°F | Resp 16 | Ht 67.0 in | Wt 188.0 lb

## 2018-01-23 DIAGNOSIS — J441 Chronic obstructive pulmonary disease with (acute) exacerbation: Secondary | ICD-10-CM | POA: Diagnosis not present

## 2018-01-23 DIAGNOSIS — J432 Centrilobular emphysema: Secondary | ICD-10-CM

## 2018-01-23 MED ORDER — AZITHROMYCIN 250 MG PO TABS: 250.0000 mg | ORAL_TABLET | Freq: Once | ORAL | 0 refills | Status: AC

## 2018-01-23 MED ORDER — PREDNISONE 10 MG (21) PO TBPK: ORAL_TABLET | ORAL | 0 refills | Status: DC

## 2018-01-23 NOTE — Patient Instructions (Addendum)
Follow up with Dr. Mortimer Fries in 3 months.

## 2018-01-23 NOTE — Progress Notes (Signed)
Date: 01/23/2018  MRN# [id number] Julie Davies [birth year]  Referring Physician: NP Webb Silversmith  Julie Davies is a 84 y.o. old female seen in consultation for asthma and lung nodules  CC:  Follow up ASTHMA  PREVIOUS HISTORY Follow up Patient is a pleasant 84 year old female for evaluation of lung nodules and asthma optimization. Patient has a history of lung nodules. He follow-up pulmonologist in [place], review of records show that she was following his earliest 2014 for 2 small 8 mm lower lobe nodules with mild adenopathy. Nodules are nonspecific in a patient with a history of choking, hiatal hernia and possible aspiration. At that time surveillance was recommended. Patient stated that she had her last CT in March 2016 at all pulmonologist office. Patient is a prolonged history of asthma, smoked for about 10 years quit in the 1950s, had significant secondhand smoking exposure from appearance. For her asthma she is currently on as needed albuterol, and Flovent. Patient states she has a mild intermittent slightly productive cough at times, has not had to use albuterol rescue inhaler in a few months. However she did state that prior to walking she will take 2 puffs of albuterol. 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 Patient states she has mild dizziness today but this is a chronic problem, she lives with her daughter, she can walk up and down stairs with mild dyspnea. Review of records showed she had one asthma exacerbation in 2015 that was treated with antibiotics and steroids. As for her pulmonary nodules based on her CAT scan from 2016 patient states she has 2 new nodules for a total of 4 nodules. At today's visit there is no CD with CT scans to compare previous lung windows. Allergies: Symptoms controlled on Xyzal, Flonase and Pataday.  HPI Patient normally sees Dr. Mortimer Fries  for asthma, lung nodules.  Last visit it was noted that her asthma was flaring up, she was given a course of prednisone and Z-Pak.  She was also having issues with GERD and was scheduled to see gastroenterology.  She comes in today as an acute visit. She has been having a fever, reduced appetite, coughing. She has codeine cough syrup at night which helps her sleep at night. This has been going on for about a week and has progressed since that time. No hemoptysis.  She is using advair bid, and albuterol twice daily for the past week.    CT Chest 12/2016 with patient there is new RUL GGO, all other nodules are stable and no growth  CT chest 04/13/17 with patient the right upper lobe groundglass opacity has completely resolved and all the other subcentimeter nodules have been stable since 2016   Medication:    Current Outpatient Medications:  .  acetaminophen (TYLENOL) 500 MG tablet, Take 500 mg by mouth as needed., Disp: , Rfl:  .  albuterol (PROVENTIL HFA;VENTOLIN HFA) 108 (90 Base) MCG/ACT inhaler, Inhale 2 puffs into the lungs every 4 (four) hours as needed for wheezing or shortness of breath., Disp: 54 g, Rfl: 3 .  alendronate (FOSAMAX) 70 MG tablet, TAKE 1 TABLET EVERY 7 DAYS WITH A FULL GLASS OF WATER ON AN EMPTY STOMACH, Disp: 12 tablet, Rfl: 2 .  Alum Hydroxide-Mag Carbonate (GAVISCON EXTRA STRENGTH) 160-105 MG CHEW, Chew 2 tablets by mouth as needed., Disp: , Rfl:  .  aspirin 81 MG tablet, Take 81 mg by mouth daily., Disp: ,  Rfl:  .  Cholecalciferol (VITAMIN D) 2000 units CAPS, Take 1 capsule by mouth daily., Disp: , Rfl:  .  clobetasol cream (TEMOVATE) 0.05 %, Apply topically., Disp: , Rfl:  .  Dexlansoprazole 30 MG capsule, Take 1 capsule (30 mg total) daily by mouth., Disp: 90 capsule, Rfl: 0 .  docusate sodium (COLACE) 100 MG capsule, Take 100 mg by mouth 2 (two) times daily as needed for mild constipation., Disp: , Rfl:  .  fluconazole (DIFLUCAN) 200 MG tablet, TAKE 1 TABLET BY MOUTH  EVERY WEEK EVERY FRIDAY, Disp: , Rfl: 2 .  fluticasone-salmeterol (ADVAIR HFA) 115-21 MCG/ACT inhaler, Inhale 2 puffs into the lungs 2 (two) times daily., Disp: 3 Inhaler, Rfl: 3 .  gabapentin (NEURONTIN) 300 MG capsule, Take 300 mg by mouth 2 (two) times daily. , Disp: , Rfl:  .  Ginger, Zingiber officinalis, (GINGER EXTRACT) 250 MG CAPS, Take 250 mg by mouth 2 (two) times daily., Disp: , Rfl:  .  Glucos-MSM-C-Mn-Ginger-Willow (GLUCOSAMINE MSM COMPLEX) TABS, Take 1 tablet by mouth 2 (two) times daily., Disp: , Rfl:  .  levocetirizine (XYZAL) 5 MG tablet, Take 1 tablet (5 mg total) by mouth every evening., Disp: 30 tablet, Rfl: 0 .  Misc Natural Products (TART CHERRY ADVANCED) CAPS, Take 2 capsules by mouth daily., Disp: , Rfl:  .  Multiple Vitamins-Minerals (ONE DAILY MULTIVITAMIN WOMEN PO), Take 1 tablet by mouth daily., Disp: , Rfl:  .  Olopatadine HCl (PATADAY) 0.2 % SOLN, INSTILL 1 DROP TO EYE DAILY, Disp: 7.5 mL, Rfl: 1 .  Polyethylene Glycol 3350 GRAN, 17 g by Does not apply route as needed., Disp: , Rfl:  .  simvastatin (ZOCOR) 20 MG tablet, Take 1 tablet (20 mg total) by mouth daily., Disp: 90 tablet, Rfl: 3 .  TURMERIC PO, Take 300 mg by mouth daily., Disp: , Rfl:      Allergies:  Tramadol  Review of Systems: Gen:  Denies  fever, sweats, chills HEENT: Denies blurred vision, double vision, ear pain, eye pain, hearing loss, nose bleeds, sore throat Cvc:  No dizziness, chest pain or heaviness Resp:   +cough, +mostly non productive, +shortness of breath Other:  All other systems negative  Physical Examination:   BP 118/78 (BP Location: Left Arm, Cuff Size: Normal)   Pulse (!) 118   Temp 97.8 F (36.6 C)   Resp 16   Ht 5\' 7"  (1.702 m)   Wt 188 lb (85.3 kg)   SpO2 94%   BMI 29.44 kg/m   General Appearance: No distress  Neuro:without focal findings, mental status, speech normal, alert and oriented, cranial nerves 2-12 intact, reflexes normal and symmetric, sensation grossly  normal  HEENT: PERRLA, EOM intact, no ptosis, no other lesions noticed; Mallampati 2 Pulmonary: normal breath sounds., diaphragmatic excursion normal.No wheezing, No rales;   Sputum Production:  none CardiovascularNormal S1,S2.  No m/r/g.  Abdominal aorta pulsation normal.    Extremities: Anterior right leg with small incision, clean mild erythema around the site but no significant drainage.   CT Chest 04/02/15 No findings to suggest interstitial lung disease, 8 x 10 mm microlobulated and slightly spiculated nodule in the posterior aspect of the right upper lobe, concerning for potential primary bronchogenic neoplasm. Alternatively, given the patient's history of recent pneumonia, this could represent an area of post infectious or inflammatory scarring. There is an additional area of architectural distortion and slightly cephalad to this lesion within the right upper lobe which is also unusual, and favored to  represent an area of scarring also. Mild diffuse bronchial wall thickening with mild centrilobular emphysema Three-vessel coronary artery disease  Pulmonary function testing 06/06/2013 FVC 98% FEV110% FEV1/FVC 79% RV 110 TLC 97 DLCO 73   Pulmonary Function Test 10/18/15  FEV1 80% FEV1/FVC 75% FVC 79%  RV 71% TLC 65% RV/TLC 106% ERV 24% DLCO 75%.  Impression: no significant obstruction or response to bronchodilators. Mild to moderate restriction noted. Severe reduction in ERV ( usually seen in abdominal obesity ), clinical correlation advised.   6 minute walk test - heart heart rate 107, total distance 994 feet /303 m , no somatic and desaturations, patient use cane during test.   CT chest 03/2017 1. Resolution of right upper lobe ground-glass nodule. 2. Stable numerous small bilateral pulmonary nodules since 2016. These are considered benign. 3. No new pulmonary lesions or acute pulmonary findings. 4. No mediastinal or hilar mass or lymphadenopathy. 5. Large hiatal  hernia.   Assessment and Plan: 84 year old female with asthma and COPD.  #1  COPD/asthma with mild exacerbation likely due acute bronchitis Start z pak Start prednisone taper   #2 With h/o  pulmonary nodules Pulmonary nodules-previous work up of nodules Current CT scan of chest without contrast shows resolution of right upper lobe groundglass opacities but has persistent bilateral subcentimeter  pulmonary nodules that have been present since 2016 which are benign in nature  Deep Ashby Dawes, MD.   Board Certified in Internal Medicine, Pulmonary Medicine, [place], and Sleep Medicine.  [facility] Office Number: [phone] Pager: [phone]  Patricia Pesa, M.D.  Merton Border, M.D

## 2018-01-23 NOTE — Telephone Encounter (Signed)
Acute visit scheduled for today with DR.ss

## 2018-01-23 NOTE — Telephone Encounter (Signed)
Patient is calling Has been running a fever and coughing has been getting a lot worse Scheduled to come in 5/2 to see Dr. Mortimer Fries Would like to speak with nurse today to see if there is anything that can be prescribed Please call to discuss

## 2018-01-24 ENCOUNTER — Ambulatory Visit: Payer: Medicare Other | Admitting: Internal Medicine

## 2018-01-28 ENCOUNTER — Telehealth: Payer: Self-pay

## 2018-01-28 ENCOUNTER — Other Ambulatory Visit: Payer: Self-pay

## 2018-01-28 ENCOUNTER — Ambulatory Visit: Payer: Medicare Other | Admitting: Internal Medicine

## 2018-01-28 ENCOUNTER — Ambulatory Visit (INDEPENDENT_AMBULATORY_CARE_PROVIDER_SITE_OTHER): Payer: Medicare Other | Admitting: Internal Medicine

## 2018-01-28 ENCOUNTER — Inpatient Hospital Stay
Admission: EM | Admit: 2018-01-28 | Discharge: 2018-02-02 | DRG: 194 | Disposition: A | Discharge status: Home / Self Care | Payer: Medicare Other | Attending: Internal Medicine | Admitting: Internal Medicine

## 2018-01-28 ENCOUNTER — Encounter: Payer: Self-pay | Admitting: Emergency Medicine

## 2018-01-28 ENCOUNTER — Inpatient Hospital Stay (HOSPITAL_COMMUNITY)
Admit: 2018-01-28 | Discharge: 2018-01-28 | Disposition: A | Discharge status: Home / Self Care | Payer: Medicare Other | Attending: Internal Medicine | Admitting: Internal Medicine

## 2018-01-28 ENCOUNTER — Encounter: Payer: Self-pay | Admitting: Internal Medicine

## 2018-01-28 ENCOUNTER — Emergency Department: Payer: Medicare Other

## 2018-01-28 VITALS — BP 132/70 | HR 98 | Ht 67.0 in | Wt 187.0 lb

## 2018-01-28 DIAGNOSIS — Z7951 Long term (current) use of inhaled steroids: Secondary | ICD-10-CM | POA: Diagnosis not present

## 2018-01-28 DIAGNOSIS — R739 Hyperglycemia, unspecified: Secondary | ICD-10-CM | POA: Diagnosis present

## 2018-01-28 DIAGNOSIS — K219 Gastro-esophageal reflux disease without esophagitis: Secondary | ICD-10-CM | POA: Diagnosis present

## 2018-01-28 DIAGNOSIS — R0902 Hypoxemia: Secondary | ICD-10-CM | POA: Diagnosis present

## 2018-01-28 DIAGNOSIS — J440 Chronic obstructive pulmonary disease with acute lower respiratory infection: Secondary | ICD-10-CM | POA: Diagnosis present

## 2018-01-28 DIAGNOSIS — Z7983 Long term (current) use of bisphosphonates: Secondary | ICD-10-CM

## 2018-01-28 DIAGNOSIS — R0602 Shortness of breath: Secondary | ICD-10-CM | POA: Diagnosis not present

## 2018-01-28 DIAGNOSIS — J189 Pneumonia, unspecified organism: Principal | ICD-10-CM | POA: Diagnosis present

## 2018-01-28 DIAGNOSIS — J45901 Unspecified asthma with (acute) exacerbation: Secondary | ICD-10-CM | POA: Diagnosis not present

## 2018-01-28 DIAGNOSIS — Z86718 Personal history of other venous thrombosis and embolism: Secondary | ICD-10-CM | POA: Diagnosis not present

## 2018-01-28 DIAGNOSIS — M810 Age-related osteoporosis without current pathological fracture: Secondary | ICD-10-CM | POA: Diagnosis present

## 2018-01-28 DIAGNOSIS — I480 Paroxysmal atrial fibrillation: Secondary | ICD-10-CM | POA: Diagnosis present

## 2018-01-28 DIAGNOSIS — R42 Dizziness and giddiness: Secondary | ICD-10-CM | POA: Diagnosis present

## 2018-01-28 DIAGNOSIS — I340 Nonrheumatic mitral (valve) insufficiency: Secondary | ICD-10-CM | POA: Diagnosis not present

## 2018-01-28 DIAGNOSIS — I272 Pulmonary hypertension, unspecified: Secondary | ICD-10-CM | POA: Diagnosis not present

## 2018-01-28 DIAGNOSIS — J441 Chronic obstructive pulmonary disease with (acute) exacerbation: Secondary | ICD-10-CM | POA: Diagnosis not present

## 2018-01-28 DIAGNOSIS — Z905 Acquired absence of kidney: Secondary | ICD-10-CM

## 2018-01-28 DIAGNOSIS — R05 Cough: Secondary | ICD-10-CM | POA: Diagnosis not present

## 2018-01-28 DIAGNOSIS — R059 Cough, unspecified: Secondary | ICD-10-CM

## 2018-01-28 DIAGNOSIS — Z96653 Presence of artificial knee joint, bilateral: Secondary | ICD-10-CM | POA: Diagnosis present

## 2018-01-28 DIAGNOSIS — Z86711 Personal history of pulmonary embolism: Secondary | ICD-10-CM

## 2018-01-28 DIAGNOSIS — I4891 Unspecified atrial fibrillation: Secondary | ICD-10-CM | POA: Diagnosis not present

## 2018-01-28 DIAGNOSIS — Z794 Long term (current) use of insulin: Secondary | ICD-10-CM | POA: Diagnosis not present

## 2018-01-28 DIAGNOSIS — R0603 Acute respiratory distress: Secondary | ICD-10-CM | POA: Diagnosis present

## 2018-01-28 DIAGNOSIS — I10 Essential (primary) hypertension: Secondary | ICD-10-CM | POA: Diagnosis not present

## 2018-01-28 DIAGNOSIS — I481 Persistent atrial fibrillation: Secondary | ICD-10-CM | POA: Diagnosis not present

## 2018-01-28 DIAGNOSIS — J432 Centrilobular emphysema: Secondary | ICD-10-CM | POA: Diagnosis not present

## 2018-01-28 HISTORY — DX: Other ill-defined heart diseases: I51.89

## 2018-01-28 HISTORY — DX: Syncope and collapse: R55

## 2018-01-28 HISTORY — DX: Dizziness and giddiness: R42

## 2018-01-28 LAB — COMPREHENSIVE METABOLIC PANEL
ALBUMIN: 3.4 g/dL — AB (ref 3.5–5.0)
ALK PHOS: 52 U/L (ref 38–126)
ALT: 31 U/L (ref 14–54)
ANION GAP: 10 (ref 5–15)
AST: 26 U/L (ref 15–41)
BILIRUBIN TOTAL: 0.7 mg/dL (ref 0.3–1.2)
BUN: 12 mg/dL (ref 6–20)
CALCIUM: 8.4 mg/dL — AB (ref 8.9–10.3)
CO2: 24 mmol/L (ref 22–32)
Chloride: 104 mmol/L (ref 101–111)
Creatinine, Ser: 0.88 mg/dL (ref 0.44–1.00)
GFR calc Af Amer: >60 mL/min (ref >60)
GFR calc non Af Amer: 57 mL/min — ABNORMAL LOW (ref >60)
GLUCOSE: 118 mg/dL — AB (ref 65–99)
POTASSIUM: 3.9 mmol/L (ref 3.5–5.1)
SODIUM: 138 mmol/L (ref 135–145)
Total Protein: 7 g/dL (ref 6.5–8.1)

## 2018-01-28 LAB — LACTIC ACID, PLASMA
Lactic Acid, Venous: 1 mmol/L (ref 0.5–1.9)
Lactic Acid, Venous: 1.3 mmol/L (ref 0.5–1.9)

## 2018-01-28 LAB — CBC WITH DIFFERENTIAL/PLATELET
Basophils Absolute: 0 10*3/uL (ref 0–0.1)
Basophils Relative: 0 %
Eosinophils Absolute: 0 10*3/uL (ref 0–0.7)
Eosinophils Relative: 0 %
HEMATOCRIT: 34.9 % — AB (ref 35.0–47.0)
HEMOGLOBIN: 12 g/dL (ref 12.0–16.0)
LYMPHS ABS: 0.8 10*3/uL — AB (ref 1.0–3.6)
LYMPHS PCT: 8 %
MCH: 29.4 pg (ref 26.0–34.0)
MCHC: 34.2 g/dL (ref 32.0–36.0)
MCV: 86 fL (ref 80.0–100.0)
MONOS PCT: 9 %
Monocytes Absolute: 0.9 10*3/uL (ref 0.2–0.9)
NEUTROS PCT: 83 %
Neutro Abs: 8 10*3/uL — ABNORMAL HIGH (ref 1.4–6.5)
Platelets: 262 10*3/uL (ref 150–440)
RBC: 4.06 MIL/uL (ref 3.80–5.20)
RDW: 13 % (ref 11.5–14.5)
WBC: 9.7 10*3/uL (ref 3.6–11.0)

## 2018-01-28 LAB — PROTIME-INR
INR: 1.19
Prothrombin Time: 15 seconds (ref 11.4–15.2)

## 2018-01-28 MED ORDER — LEVOFLOXACIN IN D5W 750 MG/150ML IV SOLN
750.0000 mg | Freq: Once | INTRAVENOUS | Status: AC
  Administered 2018-01-28: 750 mg via INTRAVENOUS
  Filled 2018-01-28: qty 150

## 2018-01-28 MED ORDER — METHYLPREDNISOLONE SODIUM SUCC 125 MG IJ SOLR
125.0000 mg | Freq: Once | INTRAMUSCULAR | Status: AC
  Administered 2018-01-28: 125 mg via INTRAVENOUS
  Filled 2018-01-28: qty 2

## 2018-01-28 MED ORDER — LEVOFLOXACIN IN D5W 750 MG/150ML IV SOLN: 750.0000 mg | INTRAVENOUS | Status: DC

## 2018-01-28 MED ORDER — SODIUM CHLORIDE 0.9 % IV SOLN: 2.0000 g | INTRAVENOUS | Status: DC

## 2018-01-28 MED ORDER — SODIUM CHLORIDE 0.9 % IV SOLN
500.0000 mg | INTRAVENOUS | Status: DC
  Administered 2018-01-28: 500 mg via INTRAVENOUS
  Filled 2018-01-28: qty 500

## 2018-01-28 MED ORDER — ONDANSETRON HCL 4 MG PO TABS: 4.0000 mg | ORAL_TABLET | Freq: Four times a day (QID) | ORAL | Status: DC | PRN

## 2018-01-28 MED ORDER — ACETAMINOPHEN 650 MG RE SUPP: 650.0000 mg | Freq: Four times a day (QID) | RECTAL | Status: DC | PRN

## 2018-01-28 MED ORDER — SODIUM CHLORIDE 0.9% FLUSH
3.0000 mL | Freq: Two times a day (BID) | INTRAVENOUS | Status: DC
Start: 2018-01-28 — End: 2018-02-02
  Administered 2018-01-28 – 2018-02-02 (×11): 3 mL via INTRAVENOUS

## 2018-01-28 MED ORDER — ACETAMINOPHEN 325 MG PO TABS: 650.0000 mg | ORAL_TABLET | Freq: Four times a day (QID) | ORAL | Status: DC | PRN

## 2018-01-28 MED ORDER — METHYLPREDNISOLONE SODIUM SUCC 125 MG IJ SOLR
60.0000 mg | Freq: Two times a day (BID) | INTRAMUSCULAR | Status: DC
  Administered 2018-01-28 – 2018-01-29 (×2): 60 mg via INTRAVENOUS
  Filled 2018-01-28 (×2): qty 2

## 2018-01-28 MED ORDER — ONDANSETRON HCL 4 MG/2ML IJ SOLN: 4.0000 mg | Freq: Four times a day (QID) | INTRAMUSCULAR | Status: DC | PRN

## 2018-01-28 MED ORDER — ENOXAPARIN SODIUM 40 MG/0.4ML ~~LOC~~ SOLN
40.0000 mg | SUBCUTANEOUS | Status: DC
  Administered 2018-01-28 – 2018-01-30 (×3): 40 mg via SUBCUTANEOUS
  Filled 2018-01-28 (×3): qty 0.4

## 2018-01-28 MED ORDER — METOPROLOL TARTRATE 25 MG PO TABS
25.0000 mg | ORAL_TABLET | Freq: Two times a day (BID) | ORAL | Status: DC
Start: 2018-01-28 — End: 2018-01-31
  Administered 2018-01-28 – 2018-01-31 (×7): 25 mg via ORAL
  Filled 2018-01-28 (×6): qty 1

## 2018-01-28 MED ORDER — ALBUTEROL SULFATE (2.5 MG/3ML) 0.083% IN NEBU: 2.5000 mg | INHALATION_SOLUTION | RESPIRATORY_TRACT | Status: DC | PRN

## 2018-01-28 MED ORDER — SODIUM CHLORIDE 0.9 % IV SOLN
2.0000 g | Freq: Every day | INTRAVENOUS | Status: DC
  Filled 2018-01-28: qty 20

## 2018-01-28 MED ORDER — IPRATROPIUM-ALBUTEROL 0.5-2.5 (3) MG/3ML IN SOLN
9.0000 mL | Freq: Once | RESPIRATORY_TRACT | Status: AC
  Administered 2018-01-28: 9 mL via RESPIRATORY_TRACT
  Filled 2018-01-28: qty 3

## 2018-01-28 MED ORDER — DILTIAZEM HCL 25 MG/5ML IV SOLN
20.0000 mg | Freq: Once | INTRAVENOUS | Status: AC
  Administered 2018-01-28: 20 mg via INTRAVENOUS
  Filled 2018-01-28: qty 5

## 2018-01-28 MED ORDER — IPRATROPIUM-ALBUTEROL 0.5-2.5 (3) MG/3ML IN SOLN
3.0000 mL | Freq: Four times a day (QID) | RESPIRATORY_TRACT | Status: DC
  Administered 2018-01-28 – 2018-02-01 (×15): 3 mL via RESPIRATORY_TRACT
  Filled 2018-01-28 (×16): qty 3

## 2018-01-28 MED ORDER — POLYETHYLENE GLYCOL 3350 17 G PO PACK
17.0000 g | PACK | Freq: Every day | ORAL | Status: DC | PRN
  Administered 2018-02-01 – 2018-02-02 (×2): 17 g via ORAL
  Filled 2018-01-28 (×3): qty 1

## 2018-01-28 NOTE — ED Notes (Signed)
Attempted to call report and was advised that the nurse would need to call me back 

## 2018-01-28 NOTE — Progress Notes (Signed)
Pharmacy Antibiotic Note  Julie Davies is a 84 y.o. female admitted on 01/28/2018 with pneumonia.  Pharmacy has been consulted for Levaquin dosing.  Plan: Levaquin 750 mg iv q 48 hours.   Height: 5\' 7"  (170.2 cm) Weight: 187 lb (84.8 kg) IBW/kg (Calculated) : 61.6  Temp (24hrs), Avg:98.6 F (37 C), Min:98.6 F (37 C), Max:98.6 F (37 C)  Recent Labs  Lab 01/28/18 1224  WBC 9.7  CREATININE 0.88  LATICACIDVEN 1.0    Estimated Creatinine Clearance: 49.5 mL/min (by C-G formula based on SCr of 0.88 mg/dL).    Allergies  Allergen Reactions  . Tramadol Other (See Comments)    Pt states she had a syncopal episode and confusion with Elev BP.     Antimicrobials this admission: azithromycin 5/6 x 1 Levaquin 5/6 >>   Dose adjustments this admission:   Microbiology results: 5/6 BCx: sent  Thank you for allowing pharmacy to be a part of this patient's care.  Ulice Dash D 01/28/2018 2:10 PM

## 2018-01-28 NOTE — H&P (Signed)
[facility] NAME: Julie Davies    MR#:  [id number]  DATE OF BIRTH:  [birth year]  DATE OF ADMISSION:  01/28/2018  PRIMARY CARE PHYSICIAN: Jearld Fenton, NP   REQUESTING/REFERRING PHYSICIAN: Dr. Lucita Lora  CHIEF COMPLAINT:   Chief Complaint  Patient presents with  . Shortness of Breath  . Chest Pain  . Cough    HISTORY OF PRESENT ILLNESS:  Julie Davies  is a 84 y.o. female with a known history of Asthma here with worsening SOB and wheezing. She has used azithromycin as OP. Was started on prednisone dose pack at 10mg  daily for 21 days. Today she went to see her pulmonologist and was referred to ED. CXR shows right pneumonia.  PAST MEDICAL HISTORY:   Past Medical History:  Diagnosis Date  . Allergy   . Arthritis   . Asthma   . Basal cell carcinoma of skin   . Chicken pox   . GERD (gastroesophageal reflux disease)   . History of kidney stones   . Pulmonary embolism on left [facility])     PAST SURGICAL HISTORY:   Past Surgical History:  Procedure Laterality Date  . APPENDECTOMY  1935  . DILATION AND CURETTAGE OF UTERUS  1975  . NEPHRECTOMY Right 1975  . REPLACEMENT TOTAL KNEE BILATERAL  2013  . VEIN LIGATION AND STRIPPING Left 1971    SOCIAL HISTORY:   Social History   Tobacco Use  . Smoking status: Never Smoker  . Smokeless tobacco: Never Used  Substance Use Topics  . Alcohol use: Yes    Alcohol/week: 0.0 oz    Comment: rare--wine    FAMILY HISTORY:   Family History  Problem Relation Age of Onset  . Heart disease Father   . Stroke Maternal Uncle   . Heart disease Maternal Aunt   . Cancer Neg Hx   . Diabetes Neg Hx     DRUG ALLERGIES:   Allergies  Allergen Reactions  . Tramadol Other (See Comments)    Pt states she had a syncopal episode and confusion with Elev BP.     REVIEW OF SYSTEMS:   Review of Systems  Constitutional: Positive for chills and malaise/fatigue. Negative for fever.   HENT: Negative for sore throat.   Eyes: Negative for blurred vision, double vision and pain.  Respiratory: Positive for cough, sputum production, shortness of breath and wheezing. Negative for hemoptysis.   Cardiovascular: Negative for chest pain, palpitations, orthopnea and leg swelling.  Gastrointestinal: Negative for abdominal pain, constipation, diarrhea, heartburn, nausea and vomiting.  Genitourinary: Negative for dysuria and hematuria.  Musculoskeletal: Negative for back pain and joint pain.  Skin: Negative for rash.  Neurological: Negative for sensory change, speech change, focal weakness and headaches.  Endo/Heme/Allergies: Does not bruise/bleed easily.  Psychiatric/Behavioral: Negative for depression. The patient is not nervous/anxious.     MEDICATIONS AT HOME:   Prior to Admission medications   Medication Sig Start Date End Date Taking? Authorizing Provider  acetaminophen (TYLENOL) 500 MG tablet Take 500 mg by mouth as needed.    [provider]  albuterol (PROVENTIL HFA;VENTOLIN HFA) 108 (90 Base) MCG/ACT inhaler Inhale 2 puffs into the lungs every 4 (four) hours as needed for wheezing or shortness of breath. 10/20/16   Jearld Fenton, NP  alendronate (FOSAMAX) 70 MG tablet TAKE 1 TABLET EVERY 7 DAYS WITH A FULL GLASS OF WATER ON AN EMPTY STOMACH 06/04/17   Jearld Fenton, NP  Alum  Hydroxide-Mag Carbonate (GAVISCON EXTRA STRENGTH) 160-105 MG CHEW Chew 2 tablets by mouth as needed.    [provider]  aspirin 81 MG tablet Take 81 mg by mouth daily.    [provider]  Cholecalciferol (VITAMIN D) 2000 units CAPS Take 1 capsule by mouth daily.    [provider]  clobetasol cream (TEMOVATE) 0.05 % Apply topically.    [provider]  Dexlansoprazole 30 MG capsule Take 1 capsule (30 mg total) daily by mouth. 08/07/17   [name], MD  docusate sodium (COLACE) 100 MG capsule Take 100 mg by mouth 2 (two) times daily as needed for mild  constipation.    [provider]  fluconazole (DIFLUCAN) 200 MG tablet TAKE 1 TABLET BY MOUTH EVERY WEEK EVERY FRIDAY 11/14/17   [provider]  fluticasone-salmeterol (ADVAIR HFA) 115-21 MCG/ACT inhaler Inhale 2 puffs into the lungs 2 (two) times daily. 02/27/17   Flora Lipps, MD  gabapentin (NEURONTIN) 300 MG capsule Take 300 mg by mouth 2 (two) times daily.  06/10/15   [provider]  Ginger, Zingiber officinalis, (GINGER EXTRACT) 250 MG CAPS Take 250 mg by mouth 2 (two) times daily.    [provider]  Glucos-MSM-C-Mn-Ginger-Willow (GLUCOSAMINE MSM COMPLEX) TABS Take 1 tablet by mouth 2 (two) times daily.    [provider]  levocetirizine (XYZAL) 5 MG tablet Take 1 tablet (5 mg total) by mouth every evening. 09/10/14   Jearld Fenton, NP  Misc Natural Products (TART CHERRY ADVANCED) CAPS Take 2 capsules by mouth daily.    [provider]  Multiple Vitamins-Minerals (ONE DAILY MULTIVITAMIN WOMEN PO) Take 1 tablet by mouth daily.    [provider]  Olopatadine HCl (PATADAY) 0.2 % SOLN INSTILL 1 DROP TO EYE DAILY 10/20/16   Jearld Fenton, NP  Polyethylene Glycol 3350 GRAN 17 g by Does not apply route as needed.    [provider]  predniSONE (STERAPRED UNI-PAK 21 TAB) 10 MG (21) TBPK tablet Take as directed. 01/23/18   Laverle Hobby, MD  simvastatin (ZOCOR) 20 MG tablet Take 1 tablet (20 mg total) by mouth daily. 05/22/17   Minna Merritts, MD  TURMERIC PO Take 300 mg by mouth daily.    [provider]     VITAL SIGNS:  Blood pressure 123/82, pulse 81, temperature 98.6 F (37 C), temperature source Oral, resp. rate 17, height 5\' 7"  (1.702 m), weight 84.8 kg (187 lb), SpO2 97 %.  PHYSICAL EXAMINATION:  Physical Exam  GENERAL:  84 y.o.-year-old patient lying in the bed with conversational dyspnea  EYES: Pupils equal, round, reactive to light and accommodation. No scleral icterus. Extraocular muscles  intact.  HEENT: Head atraumatic, normocephalic. Oropharynx and nasopharynx clear. No oropharyngeal erythema, moist oral mucosa  NECK:  Supple, no jugular venous distention. No thyroid enlargement, no tenderness.  LUNGS: B/l wheezing and decreased air entry CARDIOVASCULAR: S1, S2 normal. No murmurs, rubs, or gallops.  ABDOMEN: Soft, nontender, nondistended. Bowel sounds present. No organomegaly or mass.  EXTREMITIES: No pedal edema, cyanosis, or clubbing. + 2 pedal & radial pulses b/l.   NEUROLOGIC: Cranial nerves II through XII are intact. No focal Motor or sensory deficits appreciated b/l PSYCHIATRIC: The patient is alert and oriented x 3. Good affect.  SKIN: No obvious rash, lesion, or ulcer.   LABORATORY PANEL:   CBC Recent Labs  Lab 01/28/18 1224  WBC 9.7  HGB 12.0  HCT 34.9*  PLT 262   ------------------------------------------------------------------------------------------------------------------  Chemistries  Recent Labs  Lab 01/28/18 1224  NA 138  K 3.9  CL 104  CO2 24  GLUCOSE 118*  BUN 12  CREATININE 0.88  CALCIUM 8.4*  AST 26  ALT 31  ALKPHOS 52  BILITOT 0.7   ------------------------------------------------------------------------------------------------------------------  Cardiac Enzymes No results for input(s): TROPONINI in the last 168 hours. ------------------------------------------------------------------------------------------------------------------  RADIOLOGY:  Dg Chest Portable 1 View  Result Date: 01/28/2018 CLINICAL DATA:  Cough and shortness of breath for 1 week EXAM: PORTABLE CHEST 1 VIEW COMPARISON:  05/16/2017 FINDINGS: Cardiac shadow is stable. Lungs are well aerated bilaterally. Patchy infiltrative density is noted in the right mid lung and right lung base consistent with acute pneumonia. No other focal abnormality is seen. IMPRESSION: Patchy infiltrates in the right mid and lower lung. Followup PA and lateral chest X-ray is recommended  in 3-4 weeks following trial of antibiotic therapy to ensure resolution and exclude underlying malignancy. Electronically Signed   By: Inez Catalina M.D.   On: 01/28/2018 12:58     IMPRESSION AND PLAN:   * Right pneumonia and asthma exacerbation Patient has taken azithromycin as OP. Will start levaquin. Sputum cx. Solumedrol 60mg  IV BID O2 PRN Nebs  * New onset Afib Due to pneumonia and asthma exacerbation Returned to NSR with cardizem IV x 1 Will start low dose metoprolol ASA Check Echo and TSH  * DVT prophylaxis Lovenox  All the records are reviewed and case discussed with ED provider. Management plans discussed with the patient, family and they are in agreement.  CODE STATUS: FULL CODE  TOTAL TIME TAKING CARE OF THIS PATIENT: 40 minutes.   Leia Alf Peregrine Nolt M.D on 01/28/2018 at 1:44 PM  Between 7am to 6pm - Pager - [phone]  After 6pm go to [email or web address] - password [email or web address] [facility]  Office  [phone]  CC: Primary care physician; Jearld Fenton, NP  Note: This dictation was prepared with Dragon dictation along with smaller phrase technology. Any transcriptional errors that result from this process are unintentional.

## 2018-01-28 NOTE — ED Notes (Signed)
Pt given water to drink. 

## 2018-01-28 NOTE — Patient Instructions (Signed)
You have worse breath sounds today, will send you to the ER.

## 2018-01-28 NOTE — Progress Notes (Signed)
Date: 01/28/2018  MRN# [id number] Julie Davies [birth year]  Referring Physician: NP Webb Silversmith  Julie Davies is a 84 y.o. old female seen in consultation for asthma and lung nodules  CC:  Follow up ASTHMA  PREVIOUS HISTORY Follow up Patient is a pleasant 84 year old female for evaluation of lung nodules and asthma optimization. Patient has a history of lung nodules. He follow-up pulmonologist in [place], review of records show that she was following his earliest 2014 for 2 small 8 mm lower lobe nodules with mild adenopathy. Nodules are nonspecific in a patient with a history of choking, hiatal hernia and possible aspiration. At that time surveillance was recommended. Patient stated that she had her last CT in March 2016 at all pulmonologist office. Patient is a prolonged history of asthma, smoked for about 10 years quit in the 1950s, had significant secondhand smoking exposure from appearance. For her asthma she is currently on as needed albuterol, and Flovent. Patient states she has a mild intermittent slightly productive cough at times, has not had to use albuterol rescue inhaler in a few months. However she did state that prior to walking she will take 2 puffs of albuterol. 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 Patient states she has mild dizziness today but this is a chronic problem, she lives with her daughter, she can walk up and down stairs with mild dyspnea. Review of records showed she had one asthma exacerbation in 2015 that was treated with antibiotics and steroids. As for her pulmonary nodules based on her CAT scan from 2016 patient states she has 2 new nodules for a total of 4 nodules. At today's visit there is no CD with CT scans to compare previous lung windows. Allergies: Symptoms controlled on Xyzal, Flonase and Pataday.  HPI Patient normally sees Dr. Mortimer Fries  for asthma, lung nodules.  Last visit it was noted that her asthma was flaring up, she was given a course of prednisone and Z-Pak.  She was also having issues with GERD and was scheduled to see gastroenterology.  She comes in today as a followup acute visit. She start z pack and prednisone taper 5 days ago, she is now feeling worse, she is also taking cough medicine with minimal to no improvement.  She feels that she has subjective fevers continued at home, and feels that her breathing and cough have continued to worsen.  She is usually fairly active with going to exercise a few days a week, she has not been able to do anything other than set at home and rest, because of severe coughing and dyspnea.   CT Chest 12/2016 with patient there is new RUL GGO, all other nodules are stable and no growth  CT chest 04/13/17 with patient the right upper lobe groundglass opacity has completely resolved and all the other subcentimeter nodules have been stable since 2016   Medication:    Current Outpatient Medications:  .  acetaminophen (TYLENOL) 500 MG tablet, Take 500 mg by mouth as needed., Disp: , Rfl:  .  albuterol (PROVENTIL HFA;VENTOLIN HFA) 108 (90 Base) MCG/ACT inhaler, Inhale 2 puffs into the lungs every 4 (four) hours as needed for wheezing or shortness of breath., Disp: 54 g, Rfl: 3 .  alendronate (FOSAMAX) 70 MG tablet, TAKE 1 TABLET EVERY 7 DAYS WITH A FULL GLASS OF WATER ON AN EMPTY STOMACH, Disp: 12 tablet, Rfl: 2 .  Alum Hydroxide-Mag Carbonate (  GAVISCON EXTRA STRENGTH) 160-105 MG CHEW, Chew 2 tablets by mouth as needed., Disp: , Rfl:  .  aspirin 81 MG tablet, Take 81 mg by mouth daily., Disp: , Rfl:  .  Cholecalciferol (VITAMIN D) 2000 units CAPS, Take 1 capsule by mouth daily., Disp: , Rfl:  .  clobetasol cream (TEMOVATE) 0.05 %, Apply topically., Disp: , Rfl:  .  Dexlansoprazole 30 MG capsule, Take 1 capsule (30 mg total) daily by mouth., Disp: 90 capsule, Rfl: 0 .  docusate sodium (COLACE)  100 MG capsule, Take 100 mg by mouth 2 (two) times daily as needed for mild constipation., Disp: , Rfl:  .  fluconazole (DIFLUCAN) 200 MG tablet, TAKE 1 TABLET BY MOUTH EVERY WEEK EVERY FRIDAY, Disp: , Rfl: 2 .  fluticasone-salmeterol (ADVAIR HFA) 115-21 MCG/ACT inhaler, Inhale 2 puffs into the lungs 2 (two) times daily., Disp: 3 Inhaler, Rfl: 3 .  gabapentin (NEURONTIN) 300 MG capsule, Take 300 mg by mouth 2 (two) times daily. , Disp: , Rfl:  .  Ginger, Zingiber officinalis, (GINGER EXTRACT) 250 MG CAPS, Take 250 mg by mouth 2 (two) times daily., Disp: , Rfl:  .  Glucos-MSM-C-Mn-Ginger-Willow (GLUCOSAMINE MSM COMPLEX) TABS, Take 1 tablet by mouth 2 (two) times daily., Disp: , Rfl:  .  levocetirizine (XYZAL) 5 MG tablet, Take 1 tablet (5 mg total) by mouth every evening., Disp: 30 tablet, Rfl: 0 .  Misc Natural Products (TART CHERRY ADVANCED) CAPS, Take 2 capsules by mouth daily., Disp: , Rfl:  .  Multiple Vitamins-Minerals (ONE DAILY MULTIVITAMIN WOMEN PO), Take 1 tablet by mouth daily., Disp: , Rfl:  .  Olopatadine HCl (PATADAY) 0.2 % SOLN, INSTILL 1 DROP TO EYE DAILY, Disp: 7.5 mL, Rfl: 1 .  Polyethylene Glycol 3350 GRAN, 17 g by Does not apply route as needed., Disp: , Rfl:  .  predniSONE (STERAPRED UNI-PAK 21 TAB) 10 MG (21) TBPK tablet, Take as directed., Disp: 21 tablet, Rfl: 0 .  simvastatin (ZOCOR) 20 MG tablet, Take 1 tablet (20 mg total) by mouth daily., Disp: 90 tablet, Rfl: 3 .  TURMERIC PO, Take 300 mg by mouth daily., Disp: , Rfl:      Allergies:  Tramadol  Review of Systems: Gen:  Denies   sweats, chills HEENT: Denies blurred vision, double vision, ear pain, eye pain, hearing loss, nose bleeds, sore throat Cvc:  No dizziness, chest pain or heaviness Resp:   +cough, +mostly non productive, +shortness of breath Other:  All other systems negative  Physical Examination:   BP 132/70 (BP Location: Left Arm, Cuff Size: Normal)   Pulse 98   Ht 5\' 7"  (1.702 m)   Wt 187 lb (84.8  kg)   SpO2 95%   BMI 29.29 kg/m   General Appearance: No distress  Neuro:without focal findings, mental status, speech normal, alert and oriented, cranial nerves 2-12 intact, reflexes normal and symmetric, sensation grossly normal  HEENT: PERRLA, EOM intact, no ptosis, no other lesions noticed; Mallampati 2 Pulmonary: Diffuse bilateral wheezing. CardiovascularNormal S1,S2.  No m/r/g.  Abdominal aorta pulsation normal.    Extremities: Anterior right leg with small incision, clean mild erythema around the site but no significant drainage.   CT Chest 04/02/15 No findings to suggest interstitial lung disease, 8 x 10 mm microlobulated and slightly spiculated nodule in the posterior aspect of the right upper lobe, concerning for potential primary bronchogenic neoplasm. Alternatively, given the patient's history of recent pneumonia, this could represent an area of post infectious or inflammatory  scarring. There is an additional area of architectural distortion and slightly cephalad to this lesion within the right upper lobe which is also unusual, and favored to represent an area of scarring also. Mild diffuse bronchial wall thickening with mild centrilobular emphysema Three-vessel coronary artery disease  Pulmonary function testing 06/06/2013 FVC 98% FEV110% FEV1/FVC 79% RV 110 TLC 97 DLCO 73   Pulmonary Function Test 10/18/15  FEV1 80% FEV1/FVC 75% FVC 79%  RV 71% TLC 65% RV/TLC 106% ERV 24% DLCO 75%.  Impression: no significant obstruction or response to bronchodilators. Mild to moderate restriction noted. Severe reduction in ERV ( usually seen in abdominal obesity ), clinical correlation advised.   6 minute walk test - heart heart rate 107, total distance 994 feet /303 m , no somatic and desaturations, patient use cane during test.   CT chest 03/2017 1. Resolution of right upper lobe ground-glass nodule. 2. Stable numerous small bilateral pulmonary nodules since 2016. These are  considered benign. 3. No new pulmonary lesions or acute pulmonary findings. 4. No mediastinal or hilar mass or lymphadenopathy. 5. Large hiatal hernia.   Assessment and Plan: 84 year old female with asthma and COPD.  #1  COPD/asthma with exacerbation likely due acute bronchitis Did not respond to course of prednisone taper and azithromycin, now with diffuse bilateral wheezing continue dyspnea, subjective fevers, productive cough.  Will send to ER for further evaluation.   #2 With h/o  pulmonary nodules Pulmonary nodules-previous work up of nodules Current CT scan of chest without contrast shows resolution of right upper lobe groundglass opacities but has persistent bilateral subcentimeter  pulmonary nodules that have been present since 2016 which are benign in nature  Deep Ashby Dawes, MD.   Board Certified in Internal Medicine, Pulmonary Medicine, [place], and Sleep Medicine.   Pulmonary and Critical Care Office Number: [phone] Pager: [phone]  Patricia Pesa, M.D.  Merton Border, M.D

## 2018-01-28 NOTE — ED Provider Notes (Signed)
[facility] Emergency Department Provider Note  ____________________________________________   First MD Initiated Contact with Patient 01/28/18 1223     (approximate)  I have reviewed the triage vital signs and the nursing notes.   HISTORY  Chief Complaint Shortness of Breath; Chest Pain; and Cough   HPI Julie Davies is a 84 y.o. female with history of asthma who is presenting to the emergency department today with 5 days of worsening shortness of breath as well as cough productive of dark yellow sputum.  Says that she has felt feverish at home and has had a temperature above 100.4 degrees.  She has just finished a Z-Pak as of this morning but is having worsening symptoms.  Has not taken an inhaler or breathing treatments at home.  Says that she was prescribed Z-Pak by her pulmonologist.  Also complaining of right sided chest pain which is to the right upper chest and worse with movement.  Pain is about a 3-4 out of 10 and sharp.  Past Medical History:  Diagnosis Date  . Allergy   . Arthritis   . Asthma   . Basal cell carcinoma of skin   . Chicken pox   . GERD (gastroesophageal reflux disease)   . History of kidney stones   . Pulmonary embolism on left [facility])     Patient Active Problem List   Diagnosis Date Noted  . Asthma 04/17/2017  . Age-related osteoporosis without current pathological fracture 10/16/2016  . COPD type A ([place]) 04/19/2015  . Pulmonary nodules 03/12/2015  . Essential hypertension 09/10/2014  . HLD (hyperlipidemia) 09/10/2014  . Gastroesophageal reflux disease without esophagitis 09/10/2014  . Arthritis 09/10/2014    Past Surgical History:  Procedure Laterality Date  . APPENDECTOMY  1935  . DILATION AND CURETTAGE OF UTERUS  1975  . NEPHRECTOMY Right 1975  . REPLACEMENT TOTAL KNEE BILATERAL  2013  . VEIN LIGATION AND STRIPPING Left 1971    Prior to Admission medications   Medication Sig Start Date End Date Taking?  Authorizing Provider  acetaminophen (TYLENOL) 500 MG tablet Take 500 mg by mouth as needed.    [provider]  albuterol (PROVENTIL HFA;VENTOLIN HFA) 108 (90 Base) MCG/ACT inhaler Inhale 2 puffs into the lungs every 4 (four) hours as needed for wheezing or shortness of breath. 10/20/16   Jearld Fenton, NP  alendronate (FOSAMAX) 70 MG tablet TAKE 1 TABLET EVERY 7 DAYS WITH A FULL GLASS OF WATER ON AN EMPTY STOMACH 06/04/17   Jearld Fenton, NP  Alum Hydroxide-Mag Carbonate (GAVISCON EXTRA STRENGTH) 160-105 MG CHEW Chew 2 tablets by mouth as needed.    [provider]  aspirin 81 MG tablet Take 81 mg by mouth daily.    [provider]  Cholecalciferol (VITAMIN D) 2000 units CAPS Take 1 capsule by mouth daily.    [provider]  clobetasol cream (TEMOVATE) 0.05 % Apply topically.    [provider]  Dexlansoprazole 30 MG capsule Take 1 capsule (30 mg total) daily by mouth. 08/07/17   [name], MD  docusate sodium (COLACE) 100 MG capsule Take 100 mg by mouth 2 (two) times daily as needed for mild constipation.    [provider]  fluconazole (DIFLUCAN) 200 MG tablet TAKE 1 TABLET BY MOUTH EVERY WEEK EVERY FRIDAY 11/14/17   [provider]  fluticasone-salmeterol (ADVAIR HFA) 115-21 MCG/ACT inhaler Inhale 2 puffs into the lungs 2 (two) times daily. 02/27/17   Flora Lipps, MD  gabapentin (  NEURONTIN) 300 MG capsule Take 300 mg by mouth 2 (two) times daily.  06/10/15   [provider]  Ginger, Zingiber officinalis, (GINGER EXTRACT) 250 MG CAPS Take 250 mg by mouth 2 (two) times daily.    [provider]  Glucos-MSM-C-Mn-Ginger-Willow (GLUCOSAMINE MSM COMPLEX) TABS Take 1 tablet by mouth 2 (two) times daily.    [provider]  levocetirizine (XYZAL) 5 MG tablet Take 1 tablet (5 mg total) by mouth every evening. 09/10/14   Jearld Fenton, NP  Misc Natural Products (TART CHERRY ADVANCED) CAPS Take 2 capsules by mouth  daily.    [provider]  Multiple Vitamins-Minerals (ONE DAILY MULTIVITAMIN WOMEN PO) Take 1 tablet by mouth daily.    [provider]  Olopatadine HCl (PATADAY) 0.2 % SOLN INSTILL 1 DROP TO EYE DAILY 10/20/16   Jearld Fenton, NP  Polyethylene Glycol 3350 GRAN 17 g by Does not apply route as needed.    [provider]  predniSONE (STERAPRED UNI-PAK 21 TAB) 10 MG (21) TBPK tablet Take as directed. 01/23/18   Laverle Hobby, MD  simvastatin (ZOCOR) 20 MG tablet Take 1 tablet (20 mg total) by mouth daily. 05/22/17   Minna Merritts, MD  TURMERIC PO Take 300 mg by mouth daily.    [provider]    Allergies Tramadol  Family History  Problem Relation Age of Onset  . Heart disease Father   . Stroke Maternal Uncle   . Heart disease Maternal Aunt   . Cancer Neg Hx   . Diabetes Neg Hx     Social History Social History   Tobacco Use  . Smoking status: Never Smoker  . Smokeless tobacco: Never Used  Substance Use Topics  . Alcohol use: Yes    Alcohol/week: 0.0 oz    Comment: rare--wine  . Drug use: No    Review of Systems  Constitutional: As above Eyes: No visual changes. ENT: No sore throat. Cardiovascular: As above Respiratory: As above Gastrointestinal: No abdominal pain.  No nausea, no vomiting.  No diarrhea.  No constipation. Genitourinary: Negative for dysuria. Musculoskeletal: Negative for back pain. Skin: Negative for rash. Neurological: Negative for headaches, focal weakness or numbness.   ____________________________________________   PHYSICAL EXAM:  VITAL SIGNS: ED Triage Vitals [01/28/18 1220]  Enc Vitals Group     BP (!) 142/80     Pulse Rate (!) 136     Resp (!) 28     Temp 98.6 F (37 C)     Temp Source Oral     SpO2 98 %     Weight 187 lb (84.8 kg)     Height 5\' 7"  (1.702 m)     Head Circumference      Peak Flow      Pain Score      Pain Loc      Pain Edu?      Excl. in [place]?     Constitutional:  Alert and oriented. Well appearing and in no acute distress. Eyes: Conjunctivae are normal.  Head: Atraumatic. Nose: No congestion/rhinnorhea. Mouth/Throat: Mucous membranes are moist.  Neck: No stridor.   Cardiovascular: Tachycardic with an irregularly irregular rhythm.  Grossly normal heart sounds.  Chest pain is reproducible to palpation just inferior to the right clavicle. Respiratory: Tachypnea with labored respirations with speaking in full sentences.  Diffuse wheezing with a prolonged expiratory phase.  Expiratory cough. Gastrointestinal: Soft and nontender. No distention. Musculoskeletal: No lower extremity tenderness nor edema.  No joint effusions. Neurologic:  Normal speech and language. No gross focal neurologic deficits are appreciated. Skin:  Skin is warm, dry and intact. No rash noted. Psychiatric: Mood and affect are normal. Speech and behavior are normal.  ____________________________________________   LABS (all labs ordered are listed, but only abnormal results are displayed)  Labs Reviewed  COMPREHENSIVE METABOLIC PANEL - Abnormal; Notable for the following components:      Result Value   Glucose, Bld 118 (*)    Calcium 8.4 (*)    Albumin 3.4 (*)    GFR calc non Af Amer 57 (*)    All other components within normal limits  CBC WITH DIFFERENTIAL/PLATELET - Abnormal; Notable for the following components:   HCT 34.9 (*)    Neutro Abs 8.0 (*)    Lymphs Abs 0.8 (*)    All other components within normal limits  CULTURE, BLOOD (ROUTINE X 2)  CULTURE, BLOOD (ROUTINE X 2)  LACTIC ACID, PLASMA  PROTIME-INR  LACTIC ACID, PLASMA  URINALYSIS, COMPLETE (UACMP) WITH MICROSCOPIC   ____________________________________________  EKG  ED ECG REPORT I, Doran Stabler, the attending physician, personally viewed and interpreted this ECG.   Date: 01/28/2018  EKG Time: 1213  Rate: 136  Rhythm: atrial fibrillation, rate 136  Axis: Left axis  Intervals:left bundle branch  block  ST&T Change: T wave inversions in 1 as well as aVL.  ED ECG REPORT I, Doran Stabler, the attending physician, personally viewed and interpreted this ECG.   Date: 01/28/2018  EKG Time: 1306  Rate: 86  Rhythm: atrial fibrillation, rate 86  Axis: Normal  Intervals:left bundle branch block  ST&T Change: No ST segment elevation or depression.  T waves now upright in 1 and aVL. No significant change when compared to previous EKGs. ____________________________________________  RADIOLOGY  Right middle as well as lower infiltrate. ____________________________________________   PROCEDURES  Procedure(s) performed:   .Critical Care Performed by: Orbie Pyo, MD Authorized by: Orbie Pyo, MD   Critical care provider statement:    Critical care time (minutes):  35   Critical care was necessary to treat or prevent imminent or life-threatening deterioration of the following conditions:  Cardiac failure and respiratory failure   Critical care was time spent personally by me on the following activities:  Evaluation of patient's response to treatment, examination of patient, ordering and performing treatments and interventions, ordering and review of laboratory studies, ordering and review of radiographic studies, pulse oximetry and re-evaluation of patient's condition    Critical Care performed:   ____________________________________________   INITIAL IMPRESSION / [place] / ED COURSE  Pertinent labs & imaging results that were available during my care of the patient were reviewed by me and considered in my medical decision making (see chart for details).  Differential includes, but is not limited to, viral syndrome, bronchitis including COPD exacerbation, pneumonia, reactive airway disease including asthma, CHF including exacerbation with or without pulmonary/interstitial edema, pneumothorax, ACS, thoracic trauma, and pulmonary  embolism. As part of my medical decision making, I reviewed the following data within the electronic MEDICAL RECORD NUMBER Notes from prior ED visits  ----------------------------------------- 1:49 PM on 01/28/2018 -----------------------------------------  Patient given IV Cardizem and now is in sinus rhythm at 90 bpm.  However, she says that she does not feel improved.  Persistently wheezing.  Will give albuterol as well as steroids.  Will give antibiotics as well.  Will admit patient for failure of outpatient treatment.  She is understanding  of this plan and willing to comply.  Signed out to Dr. Darvin Neighbours. ____________________________________________   FINAL CLINICAL IMPRESSION(S) / ED DIAGNOSES  Asthma exacerbation.  Pneumonia.    NEW MEDICATIONS STARTED DURING THIS VISIT:  New Prescriptions   No medications on file     Note:  This document was prepared using Dragon voice recognition software and may include unintentional dictation errors.     Orbie Pyo, MD 01/28/18 [phone]

## 2018-01-28 NOTE — ED Triage Notes (Signed)
PT arrived with family with complaints of productive cough and shortness of breath for the last week. Pt also reports chest pain. Pt's breathing labored in triage and taken to RM 13. Pt just finished azithromycin.

## 2018-01-28 NOTE — Plan of Care (Signed)
  Problem: Education: Goal: Knowledge of General Education information will improve Outcome: Progressing   Problem: Health Behavior/Discharge Planning: Goal: Ability to manage health-related needs will improve Outcome: Progressing   Problem: Clinical Measurements: Goal: Ability to maintain clinical measurements within normal limits will improve Outcome: Progressing Goal: Will remain free from infection Outcome: Progressing Goal: Diagnostic test results will improve Outcome: Progressing Goal: Respiratory complications will improve Outcome: Progressing Goal: Cardiovascular complication will be avoided Outcome: Progressing   Problem: Activity: Goal: Risk for activity intolerance will decrease Outcome: Progressing   Problem: Nutrition: Goal: Adequate nutrition will be maintained Outcome: Progressing   Problem: Coping: Goal: Level of anxiety will decrease Outcome: Progressing   Problem: Elimination: Goal: Will not experience complications related to bowel motility Outcome: Progressing Goal: Will not experience complications related to urinary retention Outcome: Progressing   Problem: Pain Managment: Goal: General experience of comfort will improve Outcome: Progressing   Problem: Safety: Goal: Ability to remain free from injury will improve Outcome: Progressing   Problem: Skin Integrity: Goal: Risk for impaired skin integrity will decrease Outcome: Progressing   Problem: Activity: Goal: Ability to tolerate increased activity will improve Outcome: Progressing   Problem: Clinical Measurements: Goal: Ability to maintain a body temperature in the normal range will improve Outcome: Progressing   Problem: Respiratory: Goal: Ability to maintain adequate ventilation will improve Outcome: Progressing Goal: Ability to maintain a clear airway will improve Outcome: Progressing   

## 2018-01-28 NOTE — Telephone Encounter (Signed)
Pt has been seen today.

## 2018-01-28 NOTE — Telephone Encounter (Signed)
[place] from [facility] called and pt has chest congestion and cough that is no better since taking the Z pak. Pt was seen by pulmonologist on 01/23/18. Pt will ck with pulmonology. Pt has appt with pulmonology today at 11:30.

## 2018-01-28 NOTE — Telephone Encounter (Signed)
Patient says she is not getting any better and wants to be seen today

## 2018-01-29 ENCOUNTER — Ambulatory Visit: Payer: Medicare Other | Admitting: Internal Medicine

## 2018-01-29 LAB — CBC
HEMATOCRIT: 34 % — AB (ref 35.0–47.0)
Hemoglobin: 11.7 g/dL — ABNORMAL LOW (ref 12.0–16.0)
MCH: 29.5 pg (ref 26.0–34.0)
MCHC: 34.4 g/dL (ref 32.0–36.0)
MCV: 85.9 fL (ref 80.0–100.0)
PLATELETS: 261 10*3/uL (ref 150–440)
RBC: 3.96 MIL/uL (ref 3.80–5.20)
RDW: 13.2 % (ref 11.5–14.5)
WBC: 7.5 10*3/uL (ref 3.6–11.0)

## 2018-01-29 LAB — ECHOCARDIOGRAM COMPLETE
Height: 67 in
Weight: 2931.2 [oz_av]

## 2018-01-29 LAB — BASIC METABOLIC PANEL
Anion gap: 10 (ref 5–15)
BUN: 15 mg/dL (ref 6–20)
CHLORIDE: 103 mmol/L (ref 101–111)
CO2: 25 mmol/L (ref 22–32)
CREATININE: 0.73 mg/dL (ref 0.44–1.00)
Calcium: 8.3 mg/dL — ABNORMAL LOW (ref 8.9–10.3)
GFR calc non Af Amer: >60 mL/min (ref >60)
Glucose, Bld: 136 mg/dL — ABNORMAL HIGH (ref 65–99)
POTASSIUM: 4.1 mmol/L (ref 3.5–5.1)
SODIUM: 138 mmol/L (ref 135–145)

## 2018-01-29 LAB — URINALYSIS, COMPLETE (UACMP) WITH MICROSCOPIC
BACTERIA UA: NONE SEEN
BILIRUBIN URINE: NEGATIVE
Glucose, UA: NEGATIVE mg/dL
HGB URINE DIPSTICK: NEGATIVE
Ketones, ur: 20 mg/dL — AB
Leukocytes, UA: NEGATIVE
Nitrite: NEGATIVE
PROTEIN: 30 mg/dL — AB
SPECIFIC GRAVITY, URINE: 1.006 (ref 1.005–1.030)
Squamous Epithelial / LPF: NONE SEEN (ref 0–5)
pH: 6 (ref 5.0–8.0)

## 2018-01-29 MED ORDER — LEVOFLOXACIN IN D5W 750 MG/150ML IV SOLN
750.0000 mg | INTRAVENOUS | Status: DC
  Administered 2018-01-29 – 2018-01-30 (×2): 750 mg via INTRAVENOUS
  Filled 2018-01-29 (×3): qty 150

## 2018-01-29 MED ORDER — HYDROCOD POLST-CPM POLST ER 10-8 MG/5ML PO SUER
5.0000 mL | Freq: Two times a day (BID) | ORAL | Status: DC | PRN
  Administered 2018-01-29 – 2018-02-01 (×6): 5 mL via ORAL
  Filled 2018-01-29 (×7): qty 5

## 2018-01-29 MED ORDER — METHYLPREDNISOLONE SODIUM SUCC 40 MG IJ SOLR
40.0000 mg | Freq: Two times a day (BID) | INTRAMUSCULAR | Status: DC
  Administered 2018-01-29: 40 mg via INTRAVENOUS
  Filled 2018-01-29: qty 1

## 2018-01-29 NOTE — Progress Notes (Signed)
[place] [facility] [place] NAME: Julie Davies    MR#:  [id number]  DATE OF BIRTH:  1926/11/06  SUBJECTIVE:  CHIEF COMPLAINT:  Pts sob is better, reporting dry cough  REVIEW OF SYSTEMS:  CONSTITUTIONAL: No fever, fatigue or weakness.  EYES: No blurred or double vision.  EARS, NOSE, AND THROAT: No tinnitus or ear pain.  RESPIRATORY: Has dry cough, improving shortness of breath, denies wheezing or hemoptysis.  CARDIOVASCULAR: No chest pain, orthopnea, edema.  GASTROINTESTINAL: No nausea, vomiting, diarrhea or abdominal pain.  GENITOURINARY: No dysuria, hematuria.  ENDOCRINE: No polyuria, nocturia,  HEMATOLOGY: No anemia, easy bruising or bleeding SKIN: No rash or lesion. MUSCULOSKELETAL: No joint pain or arthritis.   NEUROLOGIC: No tingling, numbness, weakness.  PSYCHIATRY: No anxiety or depression.   DRUG ALLERGIES:   Allergies  Allergen Reactions  . Tramadol Other (See Comments)    Pt states she had a syncopal episode and confusion with Elev BP.     VITALS:  Blood pressure (!) 157/73, pulse 81, temperature 97.9 F (36.6 C), resp. rate 16, height 5\' 7"  (1.702 m), weight 83.1 kg (183 lb 3.2 oz), SpO2 95 %.  PHYSICAL EXAMINATION:  GENERAL:  84 y.o.-year-old patient lying in the bed with no acute distress.  EYES: Pupils equal, round, reactive to light and accommodation. No scleral icterus. Extraocular muscles intact.  HEENT: Head atraumatic, normocephalic. Oropharynx and nasopharynx clear.  NECK:  Supple, no jugular venous distention. No thyroid enlargement, no tenderness.  LUNGS: Moderate breath sounds bilaterally, min wheezing, no rales,rhonchi or crepitation. No use of accessory muscles of respiration.  CARDIOVASCULAR: S1, S2 normal. No murmurs, rubs, or gallops.  ABDOMEN: Soft, nontender, nondistended. Bowel sounds present. No organomegaly or mass.  EXTREMITIES: No pedal edema, cyanosis, or clubbing.  NEUROLOGIC: Cranial  nerves II through XII are intact. Muscle strength 5/5 in all extremities. Sensation intact. Gait not checked.  PSYCHIATRIC: The patient is alert and oriented x 3.  SKIN: No obvious rash, lesion, or ulcer.    LABORATORY PANEL:   CBC Recent Labs  Lab 01/29/18 0315  WBC 7.5  HGB 11.7*  HCT 34.0*  PLT 261   ------------------------------------------------------------------------------------------------------------------  Chemistries  Recent Labs  Lab 01/28/18 1224 01/29/18 0315  NA 138 138  K 3.9 4.1  CL 104 103  CO2 24 25  GLUCOSE 118* 136*  BUN 12 15  CREATININE 0.88 0.73  CALCIUM 8.4* 8.3*  AST 26  --   ALT 31  --   ALKPHOS 52  --   BILITOT 0.7  --    ------------------------------------------------------------------------------------------------------------------  Cardiac Enzymes No results for input(s): TROPONINI in the last 168 hours. ------------------------------------------------------------------------------------------------------------------  RADIOLOGY:  Dg Chest Portable 1 View  Result Date: 01/28/2018 CLINICAL DATA:  Cough and shortness of breath for 1 week EXAM: PORTABLE CHEST 1 VIEW COMPARISON:  05/16/2017 FINDINGS: Cardiac shadow is stable. Lungs are well aerated bilaterally. Patchy infiltrative density is noted in the right mid lung and right lung base consistent with acute pneumonia. No other focal abnormality is seen. IMPRESSION: Patchy infiltrates in the right mid and lower lung. Followup PA and lateral chest X-ray is recommended in 3-4 weeks following trial of antibiotic therapy to ensure resolution and exclude underlying malignancy. Electronically Signed   By: Inez Catalina M.D.   On: 01/28/2018 12:58    EKG:   Orders placed or performed during the hospital encounter of 01/28/18  . EKG 12-Lead  . EKG 12-Lead  . ED  EKG  . ED EKG  . EKG 12-Lead  . EKG 12-Lead  . ED EKG  . ED EKG    ASSESSMENT AND PLAN:  Julie Davies  is a 84 y.o. female  with a known history of Asthma here with worsening SOB and wheezing. She has used azithromycin as OP. Was started on prednisone dose pack at 10mg  daily for 21 days. Today she went to see her pulmonologist and was referred to ED.     * Right pneumonia and asthma exacerbation Patient has taken azithromycin as OP. Patient is started on Levaquin at the time of admission we will continue the same.  Try to obtain sputum culture and sensitivity  Solumedrol 60mg  IV BID O2 PRN Nebs Tussionex as needed for dry cough  * New onset Afib Due to pneumonia and asthma exacerbation Returned to NSR with cardizem IV x 1 Will start low dose metoprolol ASA Check Echo and TSH  *GERD PPI  * DVT prophylaxis Lovenox       All the records are reviewed and case discussed with Care Management/Social Workerr. Management plans discussed with the patient, family and they are in agreement.  CODE STATUS:   TOTAL TIME TAKING CARE OF THIS PATIENT:35  minutes.   POSSIBLE D/C IN 1-2 DAYS, DEPENDING ON CLINICAL CONDITION.  Note: This dictation was prepared with Dragon dictation along with smaller phrase technology. Any transcriptional errors that result from this process are unintentional.   Nicholes Mango M.D on 01/29/2018 at 2:29 PM  Between 7am to 6pm - Pager - [phone] After 6pm go to [email or web address] - password [email or web address] [facility]  Office  [phone]  CC: Primary care physician; Jearld Fenton, NP

## 2018-01-29 NOTE — Progress Notes (Signed)
Pharmacy Antibiotic Note  Julie Davies is a 84 y.o. female admitted on 01/28/2018 with pneumonia.  Pharmacy has been consulted for Levaquin dosing.  Plan: Patient with improved CrCl. Will increase to Levaquin 750mg  IV q24h.   Height: 5\' 7"  (170.2 cm) Weight: 183 lb 3.2 oz (83.1 kg) IBW/kg (Calculated) : 61.6  Temp (24hrs), Avg:98.2 F (36.8 C), Min:97.8 F (36.6 C), Max:98.6 F (37 C)  Recent Labs  Lab 01/28/18 1224 01/28/18 1807 01/29/18 0315  WBC 9.7  --  7.5  CREATININE 0.88  --  0.73  LATICACIDVEN 1.0 1.3  --     Estimated Creatinine Clearance: 53.9 mL/min (by C-G formula based on SCr of 0.73 mg/dL).    Allergies  Allergen Reactions  . Tramadol Other (See Comments)    Pt states she had a syncopal episode and confusion with Elev BP.     Antimicrobials this admission: azithromycin 5/6 x 1 Levaquin 5/6 >>   Dose adjustments this admission: 5/7 Levaquin 750mg  IV q48h to q24h  Microbiology results: 5/6 BCx: sent  Thank you for allowing pharmacy to be a part of this patient's care.  Paulina Fusi, PharmD, BCPS 01/29/2018 11:28 AM

## 2018-01-30 MED ORDER — METHYLPREDNISOLONE SODIUM SUCC 40 MG IJ SOLR
40.0000 mg | Freq: Every day | INTRAMUSCULAR | Status: DC
  Administered 2018-01-30 – 2018-02-01 (×3): 40 mg via INTRAVENOUS
  Filled 2018-01-30 (×3): qty 1

## 2018-01-30 MED ORDER — LEVALBUTEROL HCL 0.63 MG/3ML IN NEBU: 0.6300 mg | INHALATION_SOLUTION | RESPIRATORY_TRACT | Status: DC | PRN

## 2018-01-30 MED ORDER — PANTOPRAZOLE SODIUM 40 MG PO TBEC
40.0000 mg | DELAYED_RELEASE_TABLET | Freq: Every day | ORAL | Status: DC
  Administered 2018-01-30 – 2018-02-02 (×4): 40 mg via ORAL
  Filled 2018-01-30 (×4): qty 1

## 2018-01-30 MED ORDER — DILTIAZEM HCL 25 MG/5ML IV SOLN
5.0000 mg | Freq: Four times a day (QID) | INTRAVENOUS | Status: DC | PRN
  Administered 2018-01-30: 5 mg via INTRAVENOUS
  Filled 2018-01-30 (×3): qty 5

## 2018-01-30 MED ORDER — ALUM & MAG HYDROXIDE-SIMETH 200-200-20 MG/5ML PO SUSP
30.0000 mL | ORAL | Status: DC | PRN
  Administered 2018-01-30 – 2018-02-02 (×4): 30 mL via ORAL
  Filled 2018-01-30 (×4): qty 30

## 2018-01-30 NOTE — Progress Notes (Signed)
Md notified. Pt goes from NSR to a-fib RVR. Pt is asymptomatic, VSS. MD orders cardizem 5 mg IV every 6 hours prn and to have pharmacy remove albuterol from breathing treatments. I will continue assess.

## 2018-01-30 NOTE — Plan of Care (Signed)
  Problem: Activity: Goal: Risk for activity intolerance will decrease Outcome: Progressing   Problem: Activity: Goal: Ability to tolerate increased activity will improve Outcome: Progressing   

## 2018-01-30 NOTE — Progress Notes (Signed)
[facility] NAME: Julie Davies    MR#:  [id number]  DATE OF BIRTH:  1926/11/17  SUBJECTIVE:  CHIEF COMPLAINT:  Pts sob is better, reporting dry cough but weak  REVIEW OF SYSTEMS:  CONSTITUTIONAL: No fever, fatigue or weakness.  EYES: No blurred or double vision.  EARS, NOSE, AND THROAT: No tinnitus or ear pain.  RESPIRATORY: Has dry cough, improving shortness of breath, denies wheezing or hemoptysis.  CARDIOVASCULAR: No chest pain, orthopnea, edema.  GASTROINTESTINAL: No nausea, vomiting, diarrhea or abdominal pain.  GENITOURINARY: No dysuria, hematuria.  ENDOCRINE: No polyuria, nocturia,  HEMATOLOGY: No anemia, easy bruising or bleeding SKIN: No rash or lesion. MUSCULOSKELETAL: No joint pain or arthritis.   NEUROLOGIC: No tingling, numbness, weakness.  PSYCHIATRY: No anxiety or depression.   DRUG ALLERGIES:   Allergies  Allergen Reactions  . Tramadol Other (See Comments)    Pt states she had a syncopal episode and confusion with Elev BP.     VITALS:  Blood pressure 126/66, pulse 77, temperature 98.4 F (36.9 C), temperature source Oral, resp. rate 18, height 5\' 7"  (1.702 m), weight 82.6 kg (182 lb), SpO2 94 %.  PHYSICAL EXAMINATION:  GENERAL:  84 y.o.-year-old patient lying in the bed with no acute distress.  EYES: Pupils equal, round, reactive to light and accommodation. No scleral icterus. Extraocular muscles intact.  HEENT: Head atraumatic, normocephalic. Oropharynx and nasopharynx clear.  NECK:  Supple, no jugular venous distention. No thyroid enlargement, no tenderness.  LUNGS: Moderate breath sounds bilaterally, min wheezing, no rales,rhonchi or crepitation. No use of accessory muscles of respiration.  CARDIOVASCULAR: S1, S2 normal. No murmurs, rubs, or gallops.  ABDOMEN: Soft, nontender, nondistended. Bowel sounds present. No organomegaly or mass.  EXTREMITIES: No pedal edema, cyanosis, or clubbing.   NEUROLOGIC: Cranial nerves II through XII are intact. Muscle strength 5/5 in all extremities. Sensation intact. Gait not checked.  PSYCHIATRIC: The patient is alert and oriented x 3.  SKIN: No obvious rash, lesion, or ulcer.    LABORATORY PANEL:   CBC Recent Labs  Lab 01/29/18 0315  WBC 7.5  HGB 11.7*  HCT 34.0*  PLT 261   ------------------------------------------------------------------------------------------------------------------  Chemistries  Recent Labs  Lab 01/28/18 1224 01/29/18 0315  NA 138 138  K 3.9 4.1  CL 104 103  CO2 24 25  GLUCOSE 118* 136*  BUN 12 15  CREATININE 0.88 0.73  CALCIUM 8.4* 8.3*  AST 26  --   ALT 31  --   ALKPHOS 52  --   BILITOT 0.7  --    ------------------------------------------------------------------------------------------------------------------  Cardiac Enzymes No results for input(s): TROPONINI in the last 168 hours. ------------------------------------------------------------------------------------------------------------------  RADIOLOGY:  Dg Chest Portable 1 View  Result Date: 01/28/2018 CLINICAL DATA:  Cough and shortness of breath for 1 week EXAM: PORTABLE CHEST 1 VIEW COMPARISON:  05/16/2017 FINDINGS: Cardiac shadow is stable. Lungs are well aerated bilaterally. Patchy infiltrative density is noted in the right mid lung and right lung base consistent with acute pneumonia. No other focal abnormality is seen. IMPRESSION: Patchy infiltrates in the right mid and lower lung. Followup PA and lateral chest X-ray is recommended in 3-4 weeks following trial of antibiotic therapy to ensure resolution and exclude underlying malignancy. Electronically Signed   By: Inez Catalina M.D.   On: 01/28/2018 12:58    EKG:   Orders placed or performed during the hospital encounter of 01/28/18  . EKG 12-Lead  . EKG 12-Lead  .  ED EKG  . ED EKG  . EKG 12-Lead  . EKG 12-Lead  . ED EKG  . ED EKG    ASSESSMENT AND PLAN:  Julie Davies  is a 84 y.o. female with a known history of Asthma here with worsening SOB and wheezing. She has used azithromycin as OP. Was started on prednisone dose pack at 10mg  daily for 21 days. Today she went to see her pulmonologist and was referred to ED.     * Right pneumonia and asthma exacerbation Clinically improving Patient has taken azithromycin as OP. Patient is started on Levaquin at the time of admission we will continue the same.  Try to obtain sputum culture and sensitivity  Solumedrol 60mg  IV BID O2 PRN Nebs Tussionex as needed for dry cough  * New onset Afib Due to pneumonia and asthma exacerbation Returned to NSR with cardizem IV x 1 Will start low dose metoprolol ASA Echo nml , 60-65 %     *GERD PPI  * DVT prophylaxis Lovenox       All the records are reviewed and case discussed with Care Management/Social Workerr. Management plans discussed with the patient, family and they are in agreement.  CODE STATUS:   TOTAL TIME TAKING CARE OF THIS PATIENT:35  minutes.   POSSIBLE D/C IN 1-DAYS, DEPENDING ON CLINICAL CONDITION.  Note: This dictation was prepared with Dragon dictation along with smaller phrase technology. Any transcriptional errors that result from this process are unintentional.   Nicholes Mango M.D on 01/30/2018 at 9:05 AM  Between 7am to 6pm - Pager - [phone] After 6pm go to [email or web address] - password [email or web address] [facility]  Office  [phone]  CC: Primary care physician; Jearld Fenton, NP

## 2018-01-30 NOTE — Progress Notes (Signed)
Pt complains of indigestion. Standing order for maalox placed. I will continue to assess.

## 2018-01-31 ENCOUNTER — Encounter: Payer: Self-pay | Admitting: Physician Assistant

## 2018-01-31 DIAGNOSIS — J45901 Unspecified asthma with (acute) exacerbation: Secondary | ICD-10-CM

## 2018-01-31 DIAGNOSIS — J432 Centrilobular emphysema: Secondary | ICD-10-CM

## 2018-01-31 DIAGNOSIS — J189 Pneumonia, unspecified organism: Principal | ICD-10-CM

## 2018-01-31 DIAGNOSIS — I480 Paroxysmal atrial fibrillation: Secondary | ICD-10-CM

## 2018-01-31 LAB — BASIC METABOLIC PANEL
ANION GAP: 8 (ref 5–15)
BUN: 21 mg/dL — AB (ref 6–20)
CO2: 26 mmol/L (ref 22–32)
Calcium: 8.7 mg/dL — ABNORMAL LOW (ref 8.9–10.3)
Chloride: 103 mmol/L (ref 101–111)
Creatinine, Ser: 0.82 mg/dL (ref 0.44–1.00)
GFR calc Af Amer: >60 mL/min (ref >60)
GLUCOSE: 85 mg/dL (ref 65–99)
POTASSIUM: 4.1 mmol/L (ref 3.5–5.1)
SODIUM: 137 mmol/L (ref 135–145)

## 2018-01-31 LAB — CBC
HCT: 34.5 % — ABNORMAL LOW (ref 35.0–47.0)
Hemoglobin: 11.9 g/dL — ABNORMAL LOW (ref 12.0–16.0)
MCH: 29.7 pg (ref 26.0–34.0)
MCHC: 34.6 g/dL (ref 32.0–36.0)
MCV: 85.9 fL (ref 80.0–100.0)
PLATELETS: 358 10*3/uL (ref 150–440)
RBC: 4.01 MIL/uL (ref 3.80–5.20)
RDW: 13.3 % (ref 11.5–14.5)
WBC: 8.2 10*3/uL (ref 3.6–11.0)

## 2018-01-31 LAB — HEMOGLOBIN A1C
Hgb A1c MFr Bld: 5.6 % (ref 4.8–5.6)
Mean Plasma Glucose: 114.02 mg/dL

## 2018-01-31 LAB — TSH: TSH: 2.561 u[IU]/mL (ref 0.350–4.500)

## 2018-01-31 LAB — MAGNESIUM: Magnesium: 2.1 mg/dL (ref 1.7–2.4)

## 2018-01-31 MED ORDER — DILTIAZEM HCL 30 MG PO TABS
30.0000 mg | ORAL_TABLET | Freq: Four times a day (QID) | ORAL | Status: DC
  Administered 2018-01-31 (×2): 30 mg via ORAL
  Filled 2018-01-31 (×2): qty 1

## 2018-01-31 MED ORDER — LEVOFLOXACIN 750 MG PO TABS
750.0000 mg | ORAL_TABLET | Freq: Every day | ORAL | Status: AC
  Administered 2018-01-31 – 2018-02-01 (×2): 750 mg via ORAL
  Filled 2018-01-31 (×2): qty 1

## 2018-01-31 MED ORDER — BENZONATATE 100 MG PO CAPS
100.0000 mg | ORAL_CAPSULE | Freq: Three times a day (TID) | ORAL | Status: DC | PRN
  Filled 2018-01-31: qty 1

## 2018-01-31 MED ORDER — FUROSEMIDE 20 MG PO TABS
20.0000 mg | ORAL_TABLET | Freq: Every day | ORAL | Status: DC
  Administered 2018-01-31 – 2018-02-02 (×3): 20 mg via ORAL
  Filled 2018-01-31 (×3): qty 1

## 2018-01-31 MED ORDER — APIXABAN 5 MG PO TABS
5.0000 mg | ORAL_TABLET | Freq: Two times a day (BID) | ORAL | Status: DC
  Administered 2018-01-31 – 2018-02-02 (×5): 5 mg via ORAL
  Filled 2018-01-31 (×5): qty 1

## 2018-01-31 MED ORDER — METOPROLOL TARTRATE 25 MG PO TABS
25.0000 mg | ORAL_TABLET | Freq: Three times a day (TID) | ORAL | Status: DC
  Administered 2018-01-31 – 2018-02-01 (×3): 25 mg via ORAL
  Filled 2018-01-31 (×4): qty 1

## 2018-01-31 MED ORDER — DILTIAZEM HCL 30 MG PO TABS
45.0000 mg | ORAL_TABLET | Freq: Four times a day (QID) | ORAL | Status: DC
  Administered 2018-01-31 – 2018-02-01 (×5): 45 mg via ORAL
  Filled 2018-01-31 (×5): qty 2

## 2018-01-31 NOTE — Consult Note (Signed)
Cardiology Consultation:   Patient ID: Cyncere Ruhe; [id number]; [birth year]   Admit date: 01/28/2018 Date of Consult: 01/31/2018  Primary Care Provider: Jearld Fenton, NP Primary Cardiologist: Rockey Situ   Patient Profile:   Julie Davies is a 84 y.o. female with a hx of COPD.asthma, pulmonary nodules, presyncope felt to be related to orthostatic hypotension, and PE treated with Coumadin in 2013 in the setting of right knee replacement who is being seen today for the evaluation of new onset Afib with RVR at the request of Dr. Margaretmary Eddy, MD.  History of Present Illness:   Ms. Drone was seen in the ED in 04/2017 for presyncope with associated symptoms of SOB, chest pressure, and dizziness. Echo showed an EF of 55-60%, normal wall motion, Gr1DD, mild MR, mildly dilated left atrium. Orthostatics in cardiology, ED follow up at that time were positive. Myoview was performed in 05/2017 given her symptoms which showed a small defect of moderate severity present in the apex location, felt to be breast attenuation, EF 55-65%, low risk study.   Patient was admitted on 5/6 with worsening SOB and wheezing. She had been started on azithromycin and Prednisone as an outpatient. In Follow up with pulmonology on on 5/6 she was sent to the ED given persistent symptoms despite outpatient therapy. Upon her [facility] to [facility] she was found to have a right-sided PNA with an asthma exacerbation. She was started on ABX and steroids per IM. She was also noted to be in new onset Afib with RVR with heart rates in the 130s bpm. She was given IV Cardizem and started on metoprolol by IM. She converted to NSR on 01/28/18. Echo showed mild focal basal hypertrophy, EF 60-65%, no RWMA, Gr1DD, moderate dilated left atrium, RV cavity size and systolic function was normal, PASP 40-45 mmHg, mildly dilated IVC consistent with mildly elevated CVP. TSH was found to be normal, K+ 3.9-->4.1, SCr 0.88-->0.73, glucose 118-->136, blood culture  without growth to date x 2, WBC 9.7-->7.5, HGB 12.0-->11.7. Symptoms continued to improve with therapy as above on rounds 5/7-5/8.   Review of telemetry: 5/6 hospital presentation in Afib with RVR-->NSR at 12:47 5/7 redeveloped Afib with RVR at 14:41-->NSR at 16:23 5/8 redeveloped Afib with RVR at 14:48-->NSR at 16:30 5/8 redeveloped Afib with RVR at 17:50-->NSR at 19:02 5/8 redeveloped Afib with RVR at 20:45-->NSR at 2:01 on 5/9 5/9 redeveloped Afib with RVR at 8:15 and remains in Afib at time of cardiology consult with reasonably controlled ventricular rates in the 70s to low 100s bpm  She has been managed with IV Cardizem initially and is now on PO diltiazem 30 mg q 6 hours and Lopressor 25 mg bid. She remains in Levaquin, steroids, and nebs per IM. Cardiology was asked to evaluate her Afib.   Currently, remains in Afib with reasonably controlled ventricular rates. SOB and cough persist, though are improved. Some chest tightness associated with coughing. Cannot feel palpitations. No lower extremity swelling.   Past Medical History:  Diagnosis Date  . Allergy   . Arthritis   . Asthma   . Basal cell carcinoma of skin   . Chicken pox   . Diastolic dysfunction    a. TTE 5/19: EF 60-65%, mild focal basal hypertrophy, no RWMA, Gr1DD, mod dilated LA, RV cavity size nl with nl RVSF, PASP 40-45, mildly dilated IVC  . GERD (gastroesophageal reflux disease)   . History of kidney stones   . Postural dizziness with presyncope    a. TTE  8/18: EF 55-60%, nl WM, Gr1DD, mild MR, mildly dilated LA  . Pulmonary embolism on left [facility])    a. in the setting of right TKA; b. treated with Coumadin    Past Surgical History:  Procedure Laterality Date  . APPENDECTOMY  1935  . DILATION AND CURETTAGE OF UTERUS  1975  . NEPHRECTOMY Right 1975  . REPLACEMENT TOTAL KNEE BILATERAL  2013  . VEIN LIGATION AND STRIPPING Left 1971     Home Meds: Prior to Admission medications   Medication Sig Start Date End  Date Taking? Authorizing Provider  acetaminophen (TYLENOL) 500 MG tablet Take 500 mg by mouth every 4 (four) hours as needed for mild pain.    Yes [provider]  albuterol (PROVENTIL HFA;VENTOLIN HFA) 108 (90 Base) MCG/ACT inhaler Inhale 2 puffs into the lungs every 4 (four) hours as needed for wheezing or shortness of breath. 10/20/16  Yes Baity, Coralie Keens, NP  alendronate (FOSAMAX) 70 MG tablet TAKE 1 TABLET EVERY 7 DAYS WITH A FULL GLASS OF WATER ON AN EMPTY STOMACH 06/04/17  Yes Baity, Coralie Keens, NP  Alum Hydroxide-Mag Carbonate (GAVISCON EXTRA STRENGTH) 160-105 MG CHEW Chew 2 tablets by mouth daily as needed (Acid reflux).    Yes [provider]  aspirin 81 MG tablet Take 81 mg by mouth at bedtime.    Yes [provider]  Cholecalciferol (VITAMIN D) 2000 units CAPS Take 1 capsule by mouth daily.   Yes [provider]  Dexlansoprazole 30 MG capsule Take 1 capsule (30 mg total) daily by mouth. 08/07/17  Yes [name], MD  docusate sodium (COLACE) 100 MG capsule Take 100-200 mg by mouth 2 (two) times daily. Take 200 mg by mouth in the morning and 100 mg by mouth at bedtime.   Yes [provider]  fluconazole (DIFLUCAN) 200 MG tablet TAKE 1 TABLET BY MOUTH EVERY WEEK EVERY FRIDAY 11/14/17  Yes [provider]  fluticasone-salmeterol (ADVAIR HFA) 115-21 MCG/ACT inhaler Inhale 2 puffs into the lungs 2 (two) times daily. 02/27/17  Yes Flora Lipps, MD  gabapentin (NEURONTIN) 300 MG capsule Take 300 mg by mouth 2 (two) times daily.  06/10/15  Yes [provider]  Ginger, Zingiber officinalis, (GINGER EXTRACT) 250 MG CAPS Take 250 mg by mouth daily.    Yes [provider]  Glucos-MSM-C-Mn-Ginger-Willow (GLUCOSAMINE MSM COMPLEX) TABS Take 1 tablet by mouth 2 (two) times daily.   Yes [provider]  Ketotifen Fumarate ([employer] ALLERGY EYE DROPS OP) Place 1 drop into both eyes 2 (two) times daily as needed (allergies).   Yes [provider]  levocetirizine (XYZAL) 5 MG tablet Take 1 tablet (5 mg total) by mouth every evening. 09/10/14  Yes Baity, Coralie Keens, NP  Misc Natural Products (TART CHERRY ADVANCED) CAPS Take 1 capsule by mouth 2 (two) times daily.    Yes [provider]  Multiple Vitamins-Minerals (ONE DAILY MULTIVITAMIN WOMEN PO) Take 1 tablet by mouth daily.   Yes [provider]  Polyethylene Glycol 3350 GRAN Take 17 g by mouth daily.    Yes [provider]  predniSONE (STERAPRED UNI-PAK 21 TAB) 10 MG (21) TBPK tablet Take as directed. 01/23/18  Yes Laverle Hobby, MD  simvastatin (ZOCOR) 20 MG tablet Take 1 tablet (20 mg total) by mouth daily. 05/22/17  Yes Minna Merritts, MD  TURMERIC PO Take 300 mg by mouth at bedtime.    Yes [provider]  Olopatadine HCl (PATADAY) 0.2 % SOLN  INSTILL 1 DROP TO EYE DAILY Patient not taking: Reported on 01/28/2018 10/20/16   Jearld Fenton, NP    Inpatient Medications: Scheduled Meds: . diltiazem  30 mg Oral Q6H  . enoxaparin (LOVENOX) injection  40 mg Subcutaneous Q24H  . ipratropium-albuterol  3 mL Nebulization Q6H  . levofloxacin  750 mg Oral Daily  . methylPREDNISolone (SOLU-MEDROL) injection  40 mg Intravenous Daily  . metoprolol tartrate  25 mg Oral BID  . pantoprazole  40 mg Oral Daily  . sodium chloride flush  3 mL Intravenous Q12H   Continuous Infusions:  PRN Meds: acetaminophen **OR** acetaminophen, alum & mag hydroxide-simeth, chlorpheniramine-HYDROcodone, diltiazem, levalbuterol, ondansetron **OR** ondansetron (ZOFRAN) IV, polyethylene glycol  Allergies:   Allergies  Allergen Reactions  . Tramadol Other (See Comments)    Pt states she had a syncopal episode and confusion with Elev BP.     Social History:   Social History   Socioeconomic History  . Marital status: Widowed    Spouse name: Not on file  . Number of children: Not on file  . Years of education: Not on file  . Highest education level:  Not on file  Occupational History  . Not on file  Social Needs  . Financial resource strain: Not on file  . Food insecurity:    Worry: Not on file    Inability: Not on file  . Transportation needs:    Medical: Not on file    Non-medical: Not on file  Tobacco Use  . Smoking status: Never Smoker  . Smokeless tobacco: Never Used  Substance and Sexual Activity  . Alcohol use: Yes    Alcohol/week: 0.0 oz    Comment: rare--wine  . Drug use: No  . Sexual activity: Not Currently  Lifestyle  . Physical activity:    Days per week: Not on file    Minutes per session: Not on file  . Stress: Not on file  Relationships  . Social connections:    Talks on phone: Not on file    Gets together: Not on file    Attends religious service: Not on file    Active member of club or organization: Not on file    Attends meetings of clubs or organizations: Not on file    Relationship status: Not on file  . Intimate partner violence:    Fear of current or ex partner: Not on file    Emotionally abused: Not on file    Physically abused: Not on file    Forced sexual activity: Not on file  Other Topics Concern  . Not on file  Social History Narrative  . Not on file     Family History:   Family History  Problem Relation Age of Onset  . Heart disease Father   . Stroke Maternal Uncle   . Heart disease Maternal Aunt   . Cancer Neg Hx   . Diabetes Neg Hx     ROS:  Review of Systems  Constitutional: Positive for malaise/fatigue. Negative for chills, diaphoresis, fever and weight loss.  HENT: Negative for congestion.   Eyes: Negative for discharge and redness.  Respiratory: Positive for cough, shortness of breath and wheezing. Negative for hemoptysis and sputum production.   Cardiovascular: Negative for chest pain, palpitations, orthopnea, claudication, leg swelling and PND.       Chest tightness associated with coughing   Gastrointestinal: Negative for abdominal pain, blood in stool, heartburn,  melena, nausea and vomiting.  Genitourinary: Negative for hematuria.  Musculoskeletal: Negative for falls and myalgias.  Skin: Negative for rash.  Neurological: Positive for weakness. Negative for dizziness, tingling, tremors, sensory change, speech change, focal weakness and loss of consciousness.  Endo/Heme/Allergies: Does not bruise/bleed easily.  Psychiatric/Behavioral: Negative for substance abuse. The patient is not nervous/anxious.   All other systems reviewed and are negative.     Physical Exam/Data:   Vitals:   01/30/18 2236 01/31/18 0111 01/31/18 0417 01/31/18 0809  BP:  126/87 134/70 119/67  Pulse: (!) 126 92 70 74  Resp:   18 18  Temp:   98.5 F (36.9 C) 98.6 F (37 C)  TempSrc:   Oral Oral  SpO2:   94% 99%  Weight:   179 lb 14.4 oz (81.6 kg)   Height:        Intake/Output Summary (Last 24 hours) at 01/31/2018 0922 Last data filed at 01/31/2018 0826 Gross per 24 hour  Intake 630 ml  Output 2250 ml  Net -1620 ml   Filed Weights   01/29/18 0418 01/30/18 0340 01/31/18 0417  Weight: 183 lb 3.2 oz (83.1 kg) 182 lb (82.6 kg) 179 lb 14.4 oz (81.6 kg)   Body mass index is 28.18 kg/m.   Physical Exam: General: Well developed, well nourished, in no acute distress. Head: Normocephalic, atraumatic, sclera non-icteric, no xanthomas, nares without discharge.  Neck: Negative for carotid bruits. JVD not elevated. Lungs: Diminished and coarse breath sound bilaterally. Breathing is unlabored. Heart: Irregularly irregular with S1 S2. No murmurs, rubs, or gallops appreciated. Abdomen: Soft, non-tender, non-distended with normoactive bowel sounds. No hepatomegaly. No rebound/guarding. No obvious abdominal masses. Msk:  Strength and tone appear normal for age. Extremities: No clubbing or cyanosis. No edema. Distal pedal pulses are 2+ and equal bilaterally. Neuro: Alert and oriented X 3. No facial asymmetry. No focal deficit. Moves all extremities spontaneously. Psych:  Responds to  questions appropriately with a normal affect.   EKG:  The EKG was personally reviewed and demonstrates: EKG 5/6 at 12:13 showed Afib with RVR, 136 bpm, left axis deviation, LVH, nonspecific IVCD, occasional PVC, poor R wave progression, lateral TWI, nonspecific st/t changes. Repeat EKG on 5/6 at 13:06 showed NSR, 86 bpm, LBBB  Telemetry:  Telemetry was personally reviewed and demonstrates:  5/6 hospital presentation in Afib with RVR-->NSR at 12:47 5/7 redeveloped Afib with RVR at 14:41-->NSR at 16:23 5/8 redeveloped Afib with RVR at 14:48-->NSR at 16:30 5/8 redeveloped Afib with RVR at 17:50-->NSR at 19:02 5/8 redeveloped Afib with RVR at 20:45-->NSR at 2:01 on 5/9 5/9 redeveloped Afib with RVR at 8:15 and remains in Afib at time of cardiology consult with reasonably controlled ventricular rates in the 70s to low 100s bpm   Weights: Filed Weights   01/29/18 0418 01/30/18 0340 01/31/18 0417  Weight: 183 lb 3.2 oz (83.1 kg) 182 lb (82.6 kg) 179 lb 14.4 oz (81.6 kg)    Relevant CV Studies: TTE 01/28/2018: Study Conclusions  - Left ventricle: The cavity size was normal. There was mild focal   basal hypertrophy of the septum. Systolic function was normal.   The estimated ejection fraction was in the range of 60% to 65%.   Wall motion was normal; there were no regional wall motion   abnormalities. Doppler parameters are consistent with abnormal   left ventricular relaxation (grade 1 diastolic dysfunction). - Mitral valve: Calcified annulus. There was mild regurgitation. - Left atrium: The atrium was moderately dilated. - Right ventricle: The cavity size was normal. Wall thickness was  normal. Systolic function was normal. - Pulmonary arteries: Systolic pressure was mildly to moderately   increased, in the range of 40 mm Hg to 45 mm Hg. - Inferior vena cava: The vessel was mildly dilated. The   respirophasic diameter changes were in the normal range (>= 50%),   consistent with mildly  elevated central venous pressure.  Laboratory Data:  Chemistry Recent Labs  Lab 01/28/18 1224 01/29/18 0315  NA 138 138  K 3.9 4.1  CL 104 103  CO2 24 25  GLUCOSE 118* 136*  BUN 12 15  CREATININE 0.88 0.73  CALCIUM 8.4* 8.3*  GFRNONAA 57* >60  GFRAA >60 >60  ANIONGAP 10 10    Recent Labs  Lab 01/28/18 1224  PROT 7.0  ALBUMIN 3.4*  AST 26  ALT 31  ALKPHOS 52  BILITOT 0.7   Hematology Recent Labs  Lab 01/28/18 1224 01/29/18 0315  WBC 9.7 7.5  RBC 4.06 3.96  HGB 12.0 11.7*  HCT 34.9* 34.0*  MCV 86.0 85.9  MCH 29.4 29.5  MCHC 34.2 34.4  RDW 13.0 13.2  PLT 262 261   Cardiac EnzymesNo results for input(s): TROPONINI in the last 168 hours. No results for input(s): TROPIPOC in the last 168 hours.  BNPNo results for input(s): BNP, PROBNP in the last 168 hours.  DDimer No results for input(s): DDIMER in the last 168 hours.  Radiology/Studies:  Dg Chest Portable 1 View  Result Date: 01/28/2018 IMPRESSION: Patchy infiltrates in the right mid and lower lung. Followup PA and lateral chest X-ray is recommended in 3-4 weeks following trial of antibiotic therapy to ensure resolution and exclude underlying malignancy. Electronically Signed   By: Inez Catalina M.D.   On: 01/28/2018 12:58    Assessment and Plan:   1. New onset Afib with RVR: -Has been in and out of Afib as detailed above -Likely exacerbated by her pulmonary illness, steroids, and nebs -Consider changing standing albuterol neb to Xopenex, will defer this to IM -Currently in Afib with reasonably controlled ventricular response -Continue short-acting diltiazem 30 mg q 6 hours with plans for consolidation on 5/10 as ventricular rates allow -Continue Lopressor 25 mg bid -Hesitant to start amiodarone at this time given her underlying pulmonary function and she has been in PAF since at least 5/6 -CHADS2VASc at least 4 (age x 2, vascular disease - noted on CT chest 7/18, female) -Start Eliquis 5 mg bid, risks and  benefits discussed  -Should her ventricular rates become difficult to control she would require TEE/DCCV, though would ideally like to avoid that given her acute pulmonary illness and is unlikely to hold sinus rhythm -If she remains in Afib with controlled rates and is asymptomatic from a cardiac standpoint she could undergo DCCV after she has improved from her acute illness and has been adequately anticoagulated  -TSH normal -Potassium at goal -Check magnesium with recommendation to replete to goal > 2.0  2. Acute respiratory distress with hypoxia: -Now on room air -Likely multifactorial including CAP, asthma exacerbation, AECOPD, and pulmonary hypertension  -Supportive care  3. Pulmonary hypertension: -Will start low-dose Lasix 20 mg daily, replete K+ as needed  -Likely in the setting of her underlying pulmonary disease  -Already established with pulmonology   4. CAP/AECOPD/asthma exacerbation: -Per IM -Wean steroids when able  5. Hyperglycemia: -Check A1c  6. Presyncope: -Previously felt to be orthostatic  -Echo as above -Currently, without symptoms    For questions or updates, please contact [facility] Please consult [email or web address]  for contact info under Cardiology/STEMI.   Signed, Christell Faith, PA-C [place] Pager: [phone] 01/31/2018, 9:22 AM

## 2018-01-31 NOTE — Progress Notes (Signed)
Patient HR AFIB 120's-130's. PRN Cardizem given without any change. Patient is asymptomatic resting comfortably in bed. MD paged for further recommendations. Awaiting page back.   Update: RN still awaiting page back. MD paged again. HRs 120-140s.   Update: MD still not paged back. HR 110-120s. MD paged again.  Update: MD Duane Boston notified. Per MD start patient on PO cardizem 30 mg every 6 hours. Will continue to monitor.

## 2018-01-31 NOTE — Progress Notes (Signed)
PHARMACIST - PHYSICIAN COMMUNICATION DR:   G[place] CONCERNING: Antibiotic IV to Oral Route Change Policy  RECOMMENDATION: This patient is receiving Levaquin by the intravenous route.  Based on criteria approved by the Pharmacy and Therapeutics Committee, the antibiotic(s) is/are being converted to the equivalent oral dose form(s).   DESCRIPTION: These criteria include:  Patient being treated for a respiratory tract infection, urinary tract infection, cellulitis or clostridium difficile associated diarrhea if on metronidazole  The patient is not neutropenic and does not exhibit a GI malabsorption state  The patient is eating (either orally or via tube) and/or has been taking other orally administered medications for a least 24 hours  The patient is improving clinically and has a Tmax < 100.5  If you have questions about this conversion, please contact the Pharmacy Department  []  ( [phone] )  [place] [x]  ( [phone] )  [facility] []  ( [phone]  [place] []  ( [phone] )  [facility] []  ( [phone] )  [facility]   Raeanna Soberanes, PharmD Clinical Pharmacist  

## 2018-01-31 NOTE — Care Management Important Message (Signed)
Copy of signed IM left in patient's room.    

## 2018-01-31 NOTE — Progress Notes (Signed)
Julie Davies at [place] NAME: Julie Davies    MR#:  [id number]  DATE OF BIRTH:  [birth year]  SUBJECTIVE:  CHIEF COMPLAINT:  Pts sob is better, reporting dry cough, multiple episodes of paroxysmal A. fib  noticed  REVIEW OF SYSTEMS:  CONSTITUTIONAL: No fever, fatigue or weakness.  EYES: No blurred or double vision.  EARS, NOSE, AND THROAT: No tinnitus or ear pain.  RESPIRATORY: Has dry cough, improving shortness of breath, denies wheezing or hemoptysis.  CARDIOVASCULAR: No chest pain, orthopnea, edema.  GASTROINTESTINAL: No nausea, vomiting, diarrhea or abdominal pain.  GENITOURINARY: No dysuria, hematuria.  ENDOCRINE: No polyuria, nocturia,  HEMATOLOGY: No anemia, easy bruising or bleeding SKIN: No rash or lesion. MUSCULOSKELETAL: No joint pain or arthritis.   NEUROLOGIC: No tingling, numbness, weakness.  PSYCHIATRY: No anxiety or depression.   DRUG ALLERGIES:   Allergies  Allergen Reactions  . Tramadol Other (See Comments)    Pt states she had a syncopal episode and confusion with Elev BP.     VITALS:  Blood pressure 124/74, pulse 70, temperature 98.6 F (37 C), temperature source Oral, resp. rate 16, height 5\' 7"  (1.702 m), weight 81.6 kg (179 lb 14.4 oz), SpO2 92 %.  PHYSICAL EXAMINATION:  GENERAL:  84 y.o.-year-old patient lying in the bed with no acute distress.  EYES: Pupils equal, round, reactive to light and accommodation. No scleral icterus. Extraocular muscles intact.  HEENT: Head atraumatic, normocephalic. Oropharynx and nasopharynx clear.  NECK:  Supple, no jugular venous distention. No thyroid enlargement, no tenderness.  LUNGS: Moderate breath sounds bilaterally, min wheezing, no rales,rhonchi or crepitation. No use of accessory muscles of respiration.  CARDIOVASCULAR: Irregularly regular. No murmurs, rubs, or gallops.  ABDOMEN: Soft, nontender, nondistended. Bowel sounds present. No organomegaly or mass.   EXTREMITIES: No pedal edema, cyanosis, or clubbing.  NEUROLOGIC: Cranial nerves II through XII are intact. Muscle strength 5/5 in all extremities. Sensation intact. Gait not checked.  PSYCHIATRIC: The patient is alert and oriented x 3.  SKIN: No obvious rash, lesion, or ulcer.    LABORATORY PANEL:   CBC Recent Labs  Lab 01/31/18 0627  WBC 8.2  HGB 11.9*  HCT 34.5*  PLT 358   ------------------------------------------------------------------------------------------------------------------  Chemistries  Recent Labs  Lab 01/28/18 1224  01/31/18 0627  NA 138   < > 137  K 3.9   < > 4.1  CL 104   < > 103  CO2 24   < > 26  GLUCOSE 118*   < > 85  BUN 12   < > 21*  CREATININE 0.88   < > 0.82  CALCIUM 8.4*   < > 8.7*  MG  --   --  2.1  AST 26  --   --   ALT 31  --   --   ALKPHOS 52  --   --   BILITOT 0.7  --   --    < > = values in this interval not displayed.   ------------------------------------------------------------------------------------------------------------------  Cardiac Enzymes No results for input(s): TROPONINI in the last 168 hours. ------------------------------------------------------------------------------------------------------------------  RADIOLOGY:  No results found.  EKG:   Orders placed or performed during the hospital encounter of 01/28/18  . EKG 12-Lead  . EKG 12-Lead  . ED EKG  . ED EKG  . EKG 12-Lead  . EKG 12-Lead  . ED EKG  . ED EKG    ASSESSMENT AND PLAN:  Mariadejesus Cade  is a 84  y.o. female with a known history of Asthma here with worsening SOB and wheezing. She has used azithromycin as OP. Was started on prednisone dose pack at 10mg  daily for 21 days. Today she went to see her pulmonologist and was referred to ED.  * Right pneumonia and COPD exacerbation Clinically improving Patient has taken azithromycin as OP. Patient is started on Levaquin at the time of admission we will continue the same.  Try to obtain sputum  culture and sensitivity  albuterol changed to Xopenex neb treatments Blood cultures are negative Solumedrol 60mg  IV BID tapered to 40 mg IV once daily O2 PRN Nebs Tussionex as needed for dry cough  * New onset paroxysmal Afib with RVR  due to pneumonia and COPD exacerbation Seen by cardiology and started patient on anticoagulation with Eliquis  Diltiazem dose increased up to 45 mg every 6 hours  Metoprolol dose increased to 25 mg every 8 hours  appreciate cardiology recommendations Echo nml , 60-65 %     *GERD PPI  * DVT prophylaxis Lovenox       All the records are reviewed and case discussed with Care Management/Social Workerr. Management plans discussed with the patient, family and they are in agreement.  CODE STATUS:   TOTAL TIME TAKING CARE OF THIS PATIENT:35  minutes.   POSSIBLE D/C IN 1-DAYS, DEPENDING ON CLINICAL CONDITION.  Note: This dictation was prepared with Dragon dictation along with smaller phrase technology. Any transcriptional errors that result from this process are unintentional.   Nicholes Mango M.D on 01/31/2018 at 10:02 PM  Between 7am to 6pm - Pager - [phone] After 6pm go to [email or web address] - password [email or web address] [facility]  Office  [phone]  CC: Primary care physician; Jearld Fenton, NP

## 2018-02-01 ENCOUNTER — Inpatient Hospital Stay: Payer: Medicare Other

## 2018-02-01 DIAGNOSIS — I272 Pulmonary hypertension, unspecified: Secondary | ICD-10-CM

## 2018-02-01 LAB — CREATININE, SERUM
Creatinine, Ser: 0.94 mg/dL (ref 0.44–1.00)
GFR calc Af Amer: >60 mL/min (ref >60)
GFR calc non Af Amer: 53 mL/min — ABNORMAL LOW (ref >60)

## 2018-02-01 MED ORDER — METOPROLOL TARTRATE 25 MG PO TABS
37.5000 mg | ORAL_TABLET | Freq: Two times a day (BID) | ORAL | Status: DC
  Administered 2018-02-01 – 2018-02-02 (×2): 37.5 mg via ORAL
  Filled 2018-02-01 (×2): qty 2

## 2018-02-01 MED ORDER — PREDNISONE 50 MG PO TABS
50.0000 mg | ORAL_TABLET | Freq: Every day | ORAL | Status: DC
  Administered 2018-02-02: 50 mg via ORAL
  Filled 2018-02-01: qty 1

## 2018-02-01 MED ORDER — IPRATROPIUM-ALBUTEROL 0.5-2.5 (3) MG/3ML IN SOLN
3.0000 mL | Freq: Three times a day (TID) | RESPIRATORY_TRACT | Status: DC
  Administered 2018-02-01 – 2018-02-02 (×3): 3 mL via RESPIRATORY_TRACT
  Filled 2018-02-01 (×3): qty 3

## 2018-02-01 MED ORDER — DILTIAZEM HCL ER COATED BEADS 180 MG PO CP24
180.0000 mg | ORAL_CAPSULE | Freq: Every day | ORAL | Status: DC
  Administered 2018-02-01 – 2018-02-02 (×2): 180 mg via ORAL
  Filled 2018-02-01 (×2): qty 1

## 2018-02-01 MED ORDER — GUAIFENESIN ER 600 MG PO TB12
600.0000 mg | ORAL_TABLET | Freq: Two times a day (BID) | ORAL | Status: DC
  Administered 2018-02-01 – 2018-02-02 (×3): 600 mg via ORAL
  Filled 2018-02-01 (×3): qty 1

## 2018-02-01 NOTE — Progress Notes (Signed)
[facility] at [place] NAME: Julie Davies    MR#:  [id number]  DATE OF BIRTH:  [birth year]  SUBJECTIVE:  CHIEF COMPLAINT:  Pts sob is better, felt dizzy when she stood up today  REVIEW OF SYSTEMS:  CONSTITUTIONAL: No fever, fatigue or weakness.  EYES: No blurred or double vision.  EARS, NOSE, AND THROAT: No tinnitus or ear pain.  RESPIRATORY: Has dry cough, improving shortness of breath, denies wheezing or hemoptysis.  CARDIOVASCULAR: No chest pain, orthopnea, edema.  GASTROINTESTINAL: No nausea, vomiting, diarrhea or abdominal pain.  GENITOURINARY: No dysuria, hematuria.  ENDOCRINE: No polyuria, nocturia,  HEMATOLOGY: No anemia, easy bruising or bleeding SKIN: No rash or lesion. MUSCULOSKELETAL: No joint pain or arthritis.   NEUROLOGIC: No tingling, numbness, weakness.  PSYCHIATRY: No anxiety or depression.   DRUG ALLERGIES:   Allergies  Allergen Reactions  . Tramadol Other (See Comments)    Pt states she had a syncopal episode and confusion with Elev BP.     VITALS:  Blood pressure (!) 102/52, pulse 77, temperature 98.5 F (36.9 C), temperature source Oral, resp. rate 18, height 5\' 7"  (1.702 m), weight 81 kg (178 lb 9.6 oz), SpO2 93 %.  PHYSICAL EXAMINATION:  GENERAL:  84 y.o.-year-old patient lying in the bed with no acute distress.  EYES: Pupils equal, round, reactive to light and accommodation. No scleral icterus. Extraocular muscles intact.  HEENT: Head atraumatic, normocephalic. Oropharynx and nasopharynx clear.  NECK:  Supple, no jugular venous distention. No thyroid enlargement, no tenderness.  LUNGS: Moderate breath sounds bilaterally, min wheezing, no rales,rhonchi or crepitation. No use of accessory muscles of respiration.  CARDIOVASCULAR: Irregularly regular. No murmurs, rubs, or gallops.  ABDOMEN: Soft, nontender, nondistended. Bowel sounds present. No organomegaly or mass.  EXTREMITIES: No pedal edema,  cyanosis, or clubbing.  NEUROLOGIC: Cranial nerves II through XII are intact. Muscle strength 5/5 in all extremities. Sensation intact. Gait not checked.  PSYCHIATRIC: The patient is alert and oriented x 3.  SKIN: No obvious rash, lesion, or ulcer.    LABORATORY PANEL:   CBC Recent Labs  Lab 01/31/18 0627  WBC 8.2  HGB 11.9*  HCT 34.5*  PLT 358   ------------------------------------------------------------------------------------------------------------------  Chemistries  Recent Labs  Lab 01/28/18 1224  01/31/18 0627 02/01/18 0623  NA 138   < > 137  --   K 3.9   < > 4.1  --   CL 104   < > 103  --   CO2 24   < > 26  --   GLUCOSE 118*   < > 85  --   BUN 12   < > 21*  --   CREATININE 0.88   < > 0.82 0.94  CALCIUM 8.4*   < > 8.7*  --   MG  --   --  2.1  --   AST 26  --   --   --   ALT 31  --   --   --   ALKPHOS 52  --   --   --   BILITOT 0.7  --   --   --    < > = values in this interval not displayed.   ------------------------------------------------------------------------------------------------------------------  Cardiac Enzymes No results for input(s): TROPONINI in the last 168 hours. ------------------------------------------------------------------------------------------------------------------  RADIOLOGY:  No results found.  EKG:   Orders placed or performed during the hospital encounter of 01/28/18  . EKG 12-Lead  . EKG 12-Lead  .  ED EKG  . ED EKG  . EKG 12-Lead  . EKG 12-Lead  . ED EKG  . ED EKG    ASSESSMENT AND PLAN:  Cathie Bonnell  is a 84 y.o. female with a known history of Asthma here with worsening SOB and wheezing. She has used azithromycin as OP. Was started on prednisone dose pack at 10mg  daily for 21 days. Today she went to see her pulmonologist and was referred to ED.  * Right pneumonia and COPD exacerbation Clinically improving Patient has taken azithromycin as OP. Patient is started on Levaquin at the time of admission we  will continue the same.  Try to obtain sputum culture and sensitivity  albuterol changed to Xopenex neb treatments Blood cultures are negative Solumedrol taper to p.o. prednisone O2 PRN Nebs Tussionex as needed for dry cough  * New onset paroxysmal Afib with RVR  due to pneumonia and COPD exacerbation Seen by cardiology and started patient on anticoagulation with Eliquis  Diltiazem changed to Cardizem CD 180 mg once dai Metoprolol dose changed to 337.5 mg twice a day  appreciate cardiology recommendations Echo nml , 60-65 %    *Acute on chronic dizziness We will monitor patient closely with medication changes Orthostatics if consistently dizzy   *GERD PPI  * DVT prophylaxis Lovenox    Disposition plan to discharge patient with home health   All the records are reviewed and case discussed with Care Management/Social Workerr. Management plans discussed with the patient, family and they are in agreement.  CODE STATUS:   TOTAL TIME TAKING CARE OF THIS PATIENT:35  minutes.   POSSIBLE D/C IN 1-DAYS, DEPENDING ON CLINICAL CONDITION.  Note: This dictation was prepared with Dragon dictation along with smaller phrase technology. Any transcriptional errors that result from this process are unintentional.   Nicholes Mango M.D on 02/01/2018 at 3:52 PM  Between 7am to 6pm - Pager - [phone] After 6pm go to [email or web address] - password [email or web address] [facility]  Office  [phone]  CC: Primary care physician; Jearld Fenton, NP

## 2018-02-01 NOTE — Progress Notes (Signed)
Progress Note  Patient Name: Julie Davies Date of Encounter: 02/01/2018  Primary Cardiologist: new to [facility]  Subjective   Continued cough this m normal sinus rhythm orning, not very productive Reports that she had some thick sputum stuck in her throat last night had difficulty getting it up Overall starting to feel better, had good sleep, more energy but still lots of coughing Has not tried to walk  Telemetry reviewed showing normal sinus rhythm since 11:50 AM yesterday after medication changes.  Converted from atrial fibrillation to normal sinus rhythm at that time  Inpatient Medications    Scheduled Meds: . apixaban  5 mg Oral BID  . diltiazem  45 mg Oral Q6H  . furosemide  20 mg Oral Daily  . ipratropium-albuterol  3 mL Nebulization Q6H  . levofloxacin  750 mg Oral Daily  . methylPREDNISolone (SOLU-MEDROL) injection  40 mg Intravenous Daily  . metoprolol tartrate  25 mg Oral Q8H  . pantoprazole  40 mg Oral Daily  . sodium chloride flush  3 mL Intravenous Q12H   Continuous Infusions:  PRN Meds: acetaminophen **OR** acetaminophen, alum & mag hydroxide-simeth, benzonatate, chlorpheniramine-HYDROcodone, diltiazem, levalbuterol, ondansetron **OR** ondansetron (ZOFRAN) IV, polyethylene glycol   Vital Signs    Vitals:   01/31/18 2026 02/01/18 0159 02/01/18 0438 02/01/18 0758  BP:  131/73 124/72 125/71  Pulse:  67 70 62  Resp:   18 18  Temp:   98.2 F (36.8 C) 98.5 F (36.9 C)  TempSrc:   Oral Oral  SpO2: 92%  95% 93%  Weight:   178 lb 9.6 oz (81 kg)   Height:        Intake/Output Summary (Last 24 hours) at 02/01/2018 0913 Last data filed at 02/01/2018 0835 Gross per 24 hour  Intake 240 ml  Output 2500 ml  Net -2260 ml   Filed Weights   01/30/18 0340 01/31/18 0417 02/01/18 0438  Weight: 182 lb (82.6 kg) 179 lb 14.4 oz (81.6 kg) 178 lb 9.6 oz (81 kg)    Telemetry    Normal sinus rhythm- Personally Reviewed  ECG    - Personally Reviewed  Physical  Exam   GEN: No acute distress.   Neck: No JVD Cardiac: RRR, no murmurs, rubs, or gallops.  Respiratory:  Coarse breath sounds bilaterally throughout, lots of coughing GI: Soft, nontender, non-distended  MS: No edema; No deformity. Neuro:  Nonfocal  Psych: Normal affect   Labs    Chemistry Recent Labs  Lab 01/28/18 1224 01/29/18 0315 01/31/18 0627 02/01/18 0623  NA 138 138 137  --   K 3.9 4.1 4.1  --   CL 104 103 103  --   CO2 24 25 26   --   GLUCOSE 118* 136* 85  --   BUN 12 15 21*  --   CREATININE 0.88 0.73 0.82 0.94  CALCIUM 8.4* 8.3* 8.7*  --   PROT 7.0  --   --   --   ALBUMIN 3.4*  --   --   --   AST 26  --   --   --   ALT 31  --   --   --   ALKPHOS 52  --   --   --   BILITOT 0.7  --   --   --   GFRNONAA 57* >60 >60 53*  GFRAA >60 >60 >60 >60  ANIONGAP 10 10 8   --      Hematology Recent Labs  Lab 01/28/18 1224  01/29/18 0315 01/31/18 0627  WBC 9.7 7.5 8.2  RBC 4.06 3.96 4.01  HGB 12.0 11.7* 11.9*  HCT 34.9* 34.0* 34.5*  MCV 86.0 85.9 85.9  MCH 29.4 29.5 29.7  MCHC 34.2 34.4 34.6  RDW 13.0 13.2 13.3  PLT 262 261 358    Cardiac EnzymesNo results for input(s): TROPONINI in the last 168 hours. No results for input(s): TROPIPOC in the last 168 hours.   BNPNo results for input(s): BNP, PROBNP in the last 168 hours.   DDimer No results for input(s): DDIMER in the last 168 hours.   Radiology    No results found.  Cardiac Studies   - Left ventricle: The cavity size was normal. There was mild focal   basal hypertrophy of the septum. Systolic function was normal.   The estimated ejection fraction was in the range of 60% to 65%.   Wall motion was normal; there were no regional wall motion   abnormalities. Doppler parameters are consistent with abnormal   left ventricular relaxation (grade 1 diastolic dysfunction). - Mitral valve: Calcified annulus. There was mild regurgitation. - Left atrium: The atrium was moderately dilated. - Right ventricle: The  cavity size was normal. Wall thickness was   normal. Systolic function was normal. - Pulmonary arteries: Systolic pressure was mildly to moderately   increased, in the range of 40 mm Hg to 45 mm Hg. - Inferior vena cava: The vessel was mildly dilated. The   respirophasic diameter changes were in the normal range (>= 50%),   consistent with mildly elevated central venous pressure.  Patient Profile     84 year old woman with past medical history of COPD asthma DVT pulmonary embolism treated with Coumadin 2013 in the setting of right knee replacement presenting with COPD exacerbation pneumonia, having paroxysmal atrial fibrillation  Assessment & Plan    A/P: 1) Atrial fibrillation with RVR, paroxysmal Secondary to COPD exacerbation and pneumonia In the setting of moderately dilated left atrium, mild to moderately elevated right heart pressures  anticoagulation, eliquis --- We will consolidate her medications now that she is normal sinus rhythm --Diltiazem extended release 180 mg daily --Metoprolol tartrate 37.5 mg twice daily --Continue Lasix daily given elevated right heart pressures close monitoring of renal function as outpatient  2) pneumonia/COPD exacerbation On broad-spectrum antibiotics. Would consider ambulation with monitoring of saturations Follow-up with pulmonary D/c planning per medicine service [facility] for d/c from cardiac perspective but still with SOB and significant cough   Total encounter time more than 25 minutes  Greater than 50% was spent in counseling and coordination of care with the patient   For questions or updates, please contact [facility] Please consult [email or web address] for contact info under Cardiology/STEMI.      Signed, Ida Rogue, MD  02/01/2018, 9:13 AM

## 2018-02-02 ENCOUNTER — Other Ambulatory Visit: Payer: Self-pay | Admitting: Gastroenterology

## 2018-02-02 DIAGNOSIS — I481 Persistent atrial fibrillation: Secondary | ICD-10-CM

## 2018-02-02 DIAGNOSIS — R05 Cough: Secondary | ICD-10-CM

## 2018-02-02 LAB — CULTURE, BLOOD (ROUTINE X 2)
CULTURE: NO GROWTH
Culture: NO GROWTH
SPECIAL REQUESTS: ADEQUATE
SPECIAL REQUESTS: ADEQUATE

## 2018-02-02 MED ORDER — FUROSEMIDE 20 MG PO TABS: 20.0000 mg | ORAL_TABLET | Freq: Every day | ORAL | 0 refills | Status: DC

## 2018-02-02 MED ORDER — APIXABAN 5 MG PO TABS: 5.0000 mg | ORAL_TABLET | Freq: Two times a day (BID) | ORAL | 0 refills | Status: DC

## 2018-02-02 MED ORDER — GUAIFENESIN-DM 100-10 MG/5ML PO SYRP: 5.0000 mL | ORAL_SOLUTION | ORAL | Status: DC | PRN

## 2018-02-02 MED ORDER — DILTIAZEM HCL ER COATED BEADS 120 MG PO CP24: 120.0000 mg | ORAL_CAPSULE | Freq: Every day | ORAL | Status: DC

## 2018-02-02 MED ORDER — DILTIAZEM HCL ER COATED BEADS 120 MG PO CP24: 120.0000 mg | ORAL_CAPSULE | Freq: Every day | ORAL | 0 refills | Status: DC

## 2018-02-02 MED ORDER — ALUM & MAG HYDROXIDE-SIMETH 200-200-20 MG/5ML PO SUSP: 30.0000 mL | ORAL | 0 refills | Status: DC | PRN

## 2018-02-02 MED ORDER — METOPROLOL TARTRATE 37.5 MG PO TABS: 37.5000 mg | ORAL_TABLET | Freq: Two times a day (BID) | ORAL | 0 refills | Status: DC

## 2018-02-02 MED ORDER — PREDNISONE 10 MG (21) PO TBPK: ORAL_TABLET | ORAL | 0 refills | Status: DC

## 2018-02-02 MED ORDER — HYDROCOD POLST-CPM POLST ER 10-8 MG/5ML PO SUER: 5.0000 mL | Freq: Two times a day (BID) | ORAL | 0 refills | Status: DC | PRN

## 2018-02-02 MED ORDER — POLYETHYLENE GLYCOL 3350 17 G PO PACK: 17.0000 g | PACK | Freq: Every day | ORAL | 0 refills | Status: AC | PRN

## 2018-02-02 MED ORDER — GUAIFENESIN-DM 100-10 MG/5ML PO SYRP: 5.0000 mL | ORAL_SOLUTION | ORAL | 0 refills | Status: DC | PRN

## 2018-02-02 MED ORDER — LEVOFLOXACIN 500 MG PO TABS: 500.0000 mg | ORAL_TABLET | Freq: Every day | ORAL | 0 refills | Status: AC

## 2018-02-02 NOTE — Discharge Instructions (Signed)
Follow-up with primary care physician in a week With cardiology Dr. Rockey Situ in a week

## 2018-02-02 NOTE — Discharge Summary (Signed)
[facility] NAME: Julie Davies    MR#:  [id number]  DATE OF BIRTH:  1926-11-26  DATE OF ADMISSION:  01/28/2018 ADMITTING PHYSICIAN: Julie Bow, MD  DATE OF DISCHARGE:  02/02/18  PRIMARY CARE PHYSICIAN: Julie Fenton, NP    ADMISSION DIAGNOSIS:  Exacerbation of asthma, unspecified asthma severity, unspecified whether persistent [J45.901] Community acquired pneumonia of right lung, unspecified part of lung [J18.9]  DISCHARGE DIAGNOSIS:  Active Problems:   Pneumonia   SECONDARY DIAGNOSIS:   Past Medical History:  Diagnosis Date  . Allergy   . Arthritis   . Asthma   . Basal cell carcinoma of skin   . Chicken pox   . Diastolic dysfunction    a. TTE 5/19: EF 60-65%, mild focal basal hypertrophy, no RWMA, Gr1DD, mod dilated LA, RV cavity size nl with nl RVSF, PASP 40-45, mildly dilated IVC  . GERD (gastroesophageal reflux disease)   . History of kidney stones   . Postural dizziness with presyncope    a. TTE 8/18: EF 55-60%, nl WM, Gr1DD, mild MR, mildly dilated LA  . Pulmonary embolism on left [facility])    a. in the setting of right TKA; b. treated with Coumadin    HOSPITAL COURSE:   Julie Davies  is a 84 y.o. female with a known history of Asthma here with worsening SOB and wheezing. She has used azithromycin as OP. Was started on prednisone dose pack at 10mg  daily for 21 days. Today she went to see her pulmonologist and was referred to ED. CXR shows right pneumonia    *Right pneumonia and COPD exacerbation Clinically improving Patient has taken azithromycin as OP. Patient is started on Levaquin at the time of admission we will continue the same.  Try to obtain sputum culture and sensitivity , unable to collect sputum albuterol changed to Xopenex neb treatments Blood cultures are negative Solumedrol taper to p.o. prednisone O2 PRN, weaned off to room air Tussionex as needed for dry cough PCP to  consider repeating chest x-ray in 2 to 4 weeks  * New onset paroxysmal Afib with RVR  due to pneumonia and COPD exacerbation Seen by cardiology and started patient on anticoagulation with Eliquis  Diltiazem changed to Cardizem CD 180 mg once dai Metoprolol dose changed to 37.5 mg twice a day  appreciate cardiology recommendations.  Outpatient follow-up with cardiology in a week Echo nml , 60-65 %    *Acute on chronic dizziness We will monitor patient closely with medication changes Doing fine wants to go home no other dizzy spells    *GERD PPI  * DVT prophylaxis Lovenox  Consult case management regarding Eliquis  DISCHARGE CONDITIONS:   fair  CONSULTS OBTAINED:  Treatment Team:  Julie Merritts, MD   PROCEDURES  None   DRUG ALLERGIES:   Allergies  Allergen Reactions  . Tramadol Other (See Comments)    Pt states she had a syncopal episode and confusion with Elev BP.     DISCHARGE MEDICATIONS:   Allergies as of 02/02/2018      Reactions   Tramadol Other (See Comments)   Pt states she had a syncopal episode and confusion with Elev BP.       Medication List    STOP taking these medications   Olopatadine HCl 0.2 % Soln Commonly known as:  PATADAY     TAKE these medications   acetaminophen 500 MG tablet Commonly known as:  TYLENOL Take  500 mg by mouth every 4 (four) hours as needed for mild pain.   albuterol 108 (90 Base) MCG/ACT inhaler Commonly known as:  PROVENTIL HFA;VENTOLIN HFA Inhale 2 puffs into the lungs every 4 (four) hours as needed for wheezing or shortness of breath.   alendronate 70 MG tablet Commonly known as:  FOSAMAX TAKE 1 TABLET EVERY 7 DAYS WITH A FULL GLASS OF WATER ON AN EMPTY STOMACH   alum & mag hydroxide-simeth 200-200-20 MG/5ML suspension Commonly known as:  MAALOX/MYLANTA Take 30 mLs by mouth every 4 (four) hours as needed for indigestion or heartburn.   apixaban 5 MG Tabs tablet Commonly known as:  ELIQUIS Take 1  tablet (5 mg total) by mouth 2 (two) times daily.   aspirin 81 MG tablet Take 81 mg by mouth at bedtime.   chlorpheniramine-HYDROcodone 10-8 MG/5ML Suer Commonly known as:  TUSSIONEX Take 5 mLs by mouth every 12 (twelve) hours as needed for cough.   [employer] ALLERGY EYE DROPS OP Place 1 drop into both eyes 2 (two) times daily as needed (allergies).   Dexlansoprazole 30 MG capsule Take 1 capsule (30 mg total) daily by mouth.   diltiazem 120 MG 24 hr capsule Commonly known as:  CARDIZEM CD Take 1 capsule (120 mg total) by mouth daily. Start taking on:  02/03/2018   docusate sodium 100 MG capsule Commonly known as:  COLACE Take 100-200 mg by mouth 2 (two) times daily. Take 200 mg by mouth in the morning and 100 mg by mouth at bedtime.   fluconazole 200 MG tablet Commonly known as:  DIFLUCAN TAKE 1 TABLET BY MOUTH EVERY WEEK EVERY FRIDAY   fluticasone-salmeterol 115-21 MCG/ACT inhaler Commonly known as:  ADVAIR HFA Inhale 2 puffs into the lungs 2 (two) times daily.   furosemide 20 MG tablet Commonly known as:  LASIX Take 1 tablet (20 mg total) by mouth daily. Start taking on:  02/03/2018   gabapentin 300 MG capsule Commonly known as:  NEURONTIN Take 300 mg by mouth 2 (two) times daily.   GAVISCON EXTRA STRENGTH 160-105 MG Chew Generic drug:  Alum Hydroxide-Mag Carbonate Chew 2 tablets by mouth daily as needed (Acid reflux).   Ginger Extract 250 MG Caps Take 250 mg by mouth daily.   GLUCOSAMINE MSM COMPLEX Tabs Take 1 tablet by mouth 2 (two) times daily.   guaiFENesin-dextromethorphan 100-10 MG/5ML syrup Commonly known as:  ROBITUSSIN DM Take 5 mLs by mouth every 4 (four) hours as needed for cough.   levocetirizine 5 MG tablet Commonly known as:  XYZAL Take 1 tablet (5 mg total) by mouth every evening.   levofloxacin 500 MG tablet Commonly known as:  LEVAQUIN Take 1 tablet (500 mg total) by mouth daily for 7 days.   Metoprolol Tartrate 37.5 MG Tabs Take 37.5 mg by  mouth 2 (two) times daily.   ONE DAILY MULTIVITAMIN WOMEN PO Take 1 tablet by mouth daily.   Polyethylene Glycol 3350 Gran Take 17 g by mouth daily.   polyethylene glycol packet Commonly known as:  MIRALAX / GLYCOLAX Take 17 g by mouth daily as needed for mild constipation.   predniSONE 10 MG (21) Tbpk tablet Commonly known as:  STERAPRED UNI-PAK 21 TAB Take as directed.   simvastatin 20 MG tablet Commonly known as:  ZOCOR Take 1 tablet (20 mg total) by mouth daily.   TART CHERRY ADVANCED Caps Take 1 capsule by mouth 2 (two) times daily.   TURMERIC PO Take 300 mg by mouth at  bedtime.   Vitamin D 2000 units Caps Take 1 capsule by mouth daily.        DISCHARGE INSTRUCTIONS:   Follow-up with primary care physician in a week With cardiology Dr. Rockey Situ in a week   DIET:  Cardiac diet  DISCHARGE CONDITION:  Stable  ACTIVITY:  Activity as tolerated  OXYGEN:  Home Oxygen: No.   Oxygen Delivery: room air  DISCHARGE LOCATION:  home   If you experience worsening of your admission symptoms, develop shortness of breath, life threatening emergency, suicidal or homicidal thoughts you must seek medical attention immediately by calling 911 or calling your MD immediately  if symptoms less severe.  You Must read complete instructions/literature along with all the possible adverse reactions/side effects for all the Medicines you take and that have been prescribed to you. Take any new Medicines after you have completely understood and accpet all the possible adverse reactions/side effects.   Please note  You were cared for by a hospitalist during your hospital stay. If you have any questions about your discharge medications or the care you received while you were in the hospital after you are discharged, you can call the unit and asked to speak with the hospitalist on call if the hospitalist that took care of you is not available. Once you are discharged, your primary care  physician will handle any further medical issues. Please note that NO REFILLS for any discharge medications will be authorized once you are discharged, as it is imperative that you return to your primary care physician (or establish a relationship with a primary care physician if you do not have one) for your aftercare needs so that they can reassess your need for medications and monitor your lab values.     Today  Chief Complaint  Patient presents with  . Shortness of Breath  . Chest Pain  . Cough    Patient is feeling much better.  Cough is improving shortness of breath better Denies any dizzy spells she has chronic dizziness. feels Okay to discharge patient feels ROS:  CONSTITUTIONAL: Denies fevers, chills. Denies any fatigue, weakness.  EYES: Denies blurry vision, double vision, eye pain. EARS, NOSE, THROAT: Denies tinnitus, ear pain, hearing loss. RESPIRATORY: Denies cough, wheeze, shortness of breath.  CARDIOVASCULAR: Denies chest pain, palpitations, edema.  GASTROINTESTINAL: Denies nausea, vomiting, diarrhea, abdominal pain. Denies bright red blood per rectum. GENITOURINARY: Denies dysuria, hematuria. ENDOCRINE: Denies nocturia or thyroid problems. HEMATOLOGIC AND LYMPHATIC: Denies easy bruising or bleeding. SKIN: Denies rash or lesion. MUSCULOSKELETAL: Denies pain in neck, back, shoulder, knees, hips or arthritic symptoms.  NEUROLOGIC: Denies paralysis, paresthesias.  PSYCHIATRIC: Denies anxiety or depressive symptoms.   VITAL SIGNS:  Blood pressure 125/66, pulse 73, temperature 98.2 F (36.8 C), temperature source Oral, resp. rate 17, height 5\' 7"  (1.702 m), weight 80.8 kg (178 lb 3.2 oz), SpO2 95 %.  I/O:    Intake/Output Summary (Last 24 hours) at 02/02/2018 1458 Last data filed at 02/02/2018 1351 Gross per 24 hour  Intake 360 ml  Output 2950 ml  Net -2590 ml    PHYSICAL EXAMINATION:  GENERAL:  84 y.o.-year-old patient lying in the bed with no acute distress.   EYES: Pupils equal, round, reactive to light and accommodation. No scleral icterus. Extraocular muscles intact.  HEENT: Head atraumatic, normocephalic. Oropharynx and nasopharynx clear.  NECK:  Supple, no jugular venous distention. No thyroid enlargement, no tenderness.  LUNGS: Normal breath sounds bilaterally, no wheezing, rales,rhonchi or crepitation. No use of accessory  muscles of respiration.  CARDIOVASCULAR: S1, S2 normal. No murmurs, rubs, or gallops.  ABDOMEN: Soft, non-tender, non-distended. Bowel sounds present. No organomegaly or mass.  EXTREMITIES: No pedal edema, cyanosis, or clubbing.  NEUROLOGIC: Cranial nerves II through XII are intact. Muscle strength 5/5 in all extremities. Sensation intact. Gait not checked.  PSYCHIATRIC: The patient is alert and oriented x 3.  SKIN: No obvious rash, lesion, or ulcer.   DATA REVIEW:   CBC Recent Labs  Lab 01/31/18 0627  WBC 8.2  HGB 11.9*  HCT 34.5*  PLT 358    Chemistries  Recent Labs  Lab 01/28/18 1224  01/31/18 0627 02/01/18 0623  NA 138   < > 137  --   K 3.9   < > 4.1  --   CL 104   < > 103  --   CO2 24   < > 26  --   GLUCOSE 118*   < > 85  --   BUN 12   < > 21*  --   CREATININE 0.88   < > 0.82 0.94  CALCIUM 8.4*   < > 8.7*  --   MG  --   --  2.1  --   AST 26  --   --   --   ALT 31  --   --   --   ALKPHOS 52  --   --   --   BILITOT 0.7  --   --   --    < > = values in this interval not displayed.    Cardiac Enzymes No results for input(s): TROPONINI in the last 168 hours.  Microbiology Results  Results for orders placed or performed during the hospital encounter of 01/28/18  Culture, blood (Routine x 2)     Status: None   Collection Time: 01/28/18 12:24 PM  Result Value Ref Range Status   Specimen Description BLOOD RIGHT HAND  Final   Special Requests   Final    BOTTLES DRAWN AEROBIC AND ANAEROBIC Blood Culture adequate volume   Culture   Final    NO GROWTH 5 DAYS Performed at [facility],  [address], [place], [place] [address]    Report Status 02/02/2018 FINAL  Final  Culture, blood (Routine x 2)     Status: None   Collection Time: 01/28/18 12:24 PM  Result Value Ref Range Status   Specimen Description BLOOD LEFT ANTECUBITAL  Final   Special Requests   Final    BOTTLES DRAWN AEROBIC AND ANAEROBIC Blood Culture adequate volume   Culture   Final    NO GROWTH 5 DAYS Performed at [facility], [address] [place], [place] [address]    Report Status 02/02/2018 FINAL  Final    RADIOLOGY:  Dg Chest 2 View  Result Date: 02/01/2018 CLINICAL DATA:  Cough and dizziness today. EXAM: CHEST - 2 VIEW COMPARISON:  01/28/2018. FINDINGS: Of normal the cardiac silhouette remains in size near the upper limit. Moderate-sized hiatal hernia. Decreased patchy opacity in the right mid and lower lung zones. Small amount of patchy opacity at the left lung base. Mild left shoulder degenerative changes. IMPRESSION: 1. Improving pneumonia in the right mid and lower lung zones. Followup PA and lateral chest X-ray is recommended in 3-4 weeks following trial of antibiotic therapy to ensure resolution and exclude underlying malignancy. 2. Interval mild patchy atelectasis or pneumonia at the left lung base. 3. Moderate-sized hiatal hernia. Electronically Signed  By: Claudie Revering M.D.   On: 02/01/2018 17:59    EKG:   Orders placed or performed during the hospital encounter of 01/28/18  . EKG 12-Lead  . EKG 12-Lead  . ED EKG  . ED EKG  . EKG 12-Lead  . EKG 12-Lead  . ED EKG  . ED EKG      Management plans discussed with the patient, family and they are in agreement.  CODE STATUS:     Code Status Orders  (From admission, onward)        Start     Ordered   01/28/18 1341  Full code  Continuous     01/28/18 1342    Code Status History    This patient has a current code status but no historical code status.    Advance Directive Documentation     Most Recent Value   Type of Advance Directive  Healthcare Power of Attorney  Pre-existing out of facility DNR order (yellow form or pink MOST form)  -  "MOST" Form in Place?  -      TOTAL TIME TAKING CARE OF THIS PATIENT: 43  minutes.   Note: This dictation was prepared with Dragon dictation along with smaller phrase technology. Any transcriptional errors that result from this process are unintentional.   @MEC @  on 02/02/2018 at 2:58 PM  Between 7am to 6pm - Pager - [phone]  After 6pm go to [email or web address] - password [email or web address] [facility]  Office  [phone]  CC: Primary care physician; Julie Fenton, NP

## 2018-02-02 NOTE — Progress Notes (Signed)
Progress Note  Patient Name: Julie Davies Date of Encounter: 02/02/2018  Primary Cardiologist: new to [facility]  Subjective   Coughing is much improved Sitting up in recliner eating lunch this morning Feels much better overall Had episode of dizziness yesterday,while standing,  had to sit back down Telemetry reviewed showing no atrial fibrillation 48 hours  Inpatient Medications    Scheduled Meds: . apixaban  5 mg Oral BID  . diltiazem  180 mg Oral Daily  . furosemide  20 mg Oral Daily  . guaiFENesin  600 mg Oral BID  . ipratropium-albuterol  3 mL Nebulization TID  . metoprolol tartrate  37.5 mg Oral BID  . pantoprazole  40 mg Oral Daily  . predniSONE  50 mg Oral Q breakfast  . sodium chloride flush  3 mL Intravenous Q12H   Continuous Infusions:  PRN Meds: acetaminophen **OR** acetaminophen, alum & mag hydroxide-simeth, benzonatate, chlorpheniramine-HYDROcodone, diltiazem, levalbuterol, ondansetron **OR** ondansetron (ZOFRAN) IV, polyethylene glycol   Vital Signs    Vitals:   02/02/18 0229 02/02/18 0615 02/02/18 0737 02/02/18 0800  BP:  133/87  125/66  Pulse:  73    Resp:  17    Temp:  98.6 F (37 C)  98.2 F (36.8 C)  TempSrc:  Oral  Oral  SpO2:  93% 93% 95%  Weight: 178 lb 3.2 oz (80.8 kg)     Height:        Intake/Output Summary (Last 24 hours) at 02/02/2018 1445 Last data filed at 02/02/2018 1351 Gross per 24 hour  Intake 360 ml  Output 2950 ml  Net -2590 ml   Filed Weights   01/31/18 0417 02/01/18 0438 02/02/18 0229  Weight: 179 lb 14.4 oz (81.6 kg) 178 lb 9.6 oz (81 kg) 178 lb 3.2 oz (80.8 kg)    Telemetry    Normal sinus rhythm- Personally Reviewed  ECG    - Personally Reviewed  Physical Exam   Constitutional:  oriented to person, place, and time. No distress.  HENT:  Head: Normocephalic and atraumatic.  Eyes:  no discharge. No scleral icterus.  Neck: Normal range of motion. Neck supple. No JVD present.  Cardiovascular: Normal rate,  regular rhythm, normal heart sounds and intact distal pulses. Exam reveals no gallop and no friction rub. No edema No murmur heard. Pulmonary/Chest: Effort normal and breath sounds normal. No stridor. No respiratory distress.    Scattered rails and wheezing.  no tenderness.  Abdominal: Soft.  no distension.  no tenderness.  Musculoskeletal: Normal range of motion.  no  tenderness or deformity.  Neurological:  normal muscle tone. Coordination normal. No atrophy Skin: Skin is warm and dry. No rash noted. not diaphoretic.  Psychiatric:  normal mood and affect. behavior is normal. Thought content normal.      Labs    Chemistry Recent Labs  Lab 01/28/18 1224 01/29/18 0315 01/31/18 0627 02/01/18 0623  NA 138 138 137  --   K 3.9 4.1 4.1  --   CL 104 103 103  --   CO2 24 25 26   --   GLUCOSE 118* 136* 85  --   BUN 12 15 21*  --   CREATININE 0.88 0.73 0.82 0.94  CALCIUM 8.4* 8.3* 8.7*  --   PROT 7.0  --   --   --   ALBUMIN 3.4*  --   --   --   AST 26  --   --   --   ALT 31  --   --   --  ALKPHOS 52  --   --   --   BILITOT 0.7  --   --   --   GFRNONAA 57* >60 >60 53*  GFRAA >60 >60 >60 >60  ANIONGAP 10 10 8   --      Hematology Recent Labs  Lab 01/28/18 1224 01/29/18 0315 01/31/18 0627  WBC 9.7 7.5 8.2  RBC 4.06 3.96 4.01  HGB 12.0 11.7* 11.9*  HCT 34.9* 34.0* 34.5*  MCV 86.0 85.9 85.9  MCH 29.4 29.5 29.7  MCHC 34.2 34.4 34.6  RDW 13.0 13.2 13.3  PLT 262 261 358    Cardiac EnzymesNo results for input(s): TROPONINI in the last 168 hours. No results for input(s): TROPIPOC in the last 168 hours.   BNPNo results for input(s): BNP, PROBNP in the last 168 hours.   DDimer No results for input(s): DDIMER in the last 168 hours.   Radiology    Dg Chest 2 View  Result Date: 02/01/2018 CLINICAL DATA:  Cough and dizziness today. EXAM: CHEST - 2 VIEW COMPARISON:  01/28/2018. FINDINGS: Of normal the cardiac silhouette remains in size near the upper limit. Moderate-sized  hiatal hernia. Decreased patchy opacity in the right mid and lower lung zones. Small amount of patchy opacity at the left lung base. Mild left shoulder degenerative changes. IMPRESSION: 1. Improving pneumonia in the right mid and lower lung zones. Followup PA and lateral chest X-ray is recommended in 3-4 weeks following trial of antibiotic therapy to ensure resolution and exclude underlying malignancy. 2. Interval mild patchy atelectasis or pneumonia at the left lung base. 3. Moderate-sized hiatal hernia. Electronically Signed   By: Claudie Revering M.D.   On: 02/01/2018 17:59    Cardiac Studies   - Left ventricle: The cavity size was normal. There was mild focal   basal hypertrophy of the septum. Systolic function was normal.   The estimated ejection fraction was in the range of 60% to 65%.   Wall motion was normal; there were no regional wall motion   abnormalities. Doppler parameters are consistent with abnormal   left ventricular relaxation (grade 1 diastolic dysfunction). - Mitral valve: Calcified annulus. There was mild regurgitation. - Left atrium: The atrium was moderately dilated. - Right ventricle: The cavity size was normal. Wall thickness was   normal. Systolic function was normal. - Pulmonary arteries: Systolic pressure was mildly to moderately   increased, in the range of 40 mm Hg to 45 mm Hg. - Inferior vena cava: The vessel was mildly dilated. The   respirophasic diameter changes were in the normal range (>= 50%),   consistent with mildly elevated central venous pressure.  Patient Profile     84 year old woman with past medical history of COPD asthma DVT pulmonary embolism treated with Coumadin 2013 in the setting of right knee replacement presenting with COPD exacerbation pneumonia, having paroxysmal atrial fibrillation  Assessment & Plan    A/P: 1) Atrial fibrillation with RVR, paroxysmal Secondary to COPD exacerbation and pneumonia In the setting of moderately dilated  left atrium, mild to moderately elevated right heart pressures  anticoagulation, eliquis She reports having episode of orthostasis yesterday, improved with sitting back down Recommend we decrease diltiazem extended release down to 120 mg daily at discharge continue --Metoprolol tartrate 37.5 mg twice daily --Continue Lasix daily given elevated right heart pressures Outpatient monitoring of renal function None today for review  2) pneumonia/COPD exacerbation On antibiotics, improving Follow-up with pulmonary    Total encounter time more than 25 minutes  Greater than 50% was spent in counseling and coordination of care with the patient   For questions or updates, please contact [place] Please consult [email or web address] for contact info under Cardiology/STEMI.      Signed, Ida Rogue, MD  02/02/2018, 2:45 PM

## 2018-02-04 ENCOUNTER — Other Ambulatory Visit: Payer: Self-pay

## 2018-02-04 ENCOUNTER — Telehealth: Payer: Self-pay

## 2018-02-04 DIAGNOSIS — K219 Gastro-esophageal reflux disease without esophagitis: Secondary | ICD-10-CM

## 2018-02-04 MED ORDER — DEXLANSOPRAZOLE 30 MG PO CPDR: 1.0000 | DELAYED_RELEASE_CAPSULE | Freq: Every day | ORAL | 0 refills | Status: DC

## 2018-02-04 NOTE — Telephone Encounter (Signed)
Transitional Care Management Follow-up Telephone Call    Date discharged? 02/02/18  How have you been since you were released from the hospital? [place].   Any patient concerns? Patient would like a referral for a [facility] to assist with ADLs as needed.    Items Reviewed:  Medications reviewed: Yes  Allergies reviewed: Yes  Dietary changes reviewed: Yes  Referrals reviewed: Yes   Functional Questionnaire:  Independent - I Dependent - D    Activities of Daily Living (ADLs):    Personal hygiene - I Dressing - I Eating - I Maintaining continence - D (wears pad daily) Transferring - I (uses cane)   Independent Activities of Daily Living (iADLs): Basic communication skills - I Transportation - D (does not drive at this time) Meal preparation  - D (daughter prepares meals) Shopping - D (daughter does shopping) Housework - D (daughter does housekeeping) Managing medications - I  Managing personal finances - I   Confirmed importance and date/time of follow-up visits scheduled YES  Provider Appointment booked with PCP 02/11/18 @ 1145  Confirmed with patient if condition begins to worsen call PCP or go to the ER.  Patient was given the office number and encouraged to call back with question or concerns: YES

## 2018-02-05 ENCOUNTER — Other Ambulatory Visit: Payer: Self-pay | Admitting: Internal Medicine

## 2018-02-05 ENCOUNTER — Other Ambulatory Visit: Payer: Self-pay

## 2018-02-05 NOTE — Patient Outreach (Addendum)
[facility]) Care Management  02/05/2018  Zuma Hust [birth year] [id number]     EMMI-General Discharge RED ON EMMI ALERT Day # 1 Date: 02/04/18 Red Alert Reason: " Know who to call about changes in condition? No  Scheduled follow-up? No"   Outreach attempt # 1 to patient. Spoke with patient who voices she is doing fairly well. Reviewed and addressed red alerts with patient. She voices that she did not understand automated calls so answers were recorded in error. She is aware to call her MDs for any changes in her condition. She states she has spoken with someone from PCP office already. She goes on 02/11/18 for follow up appt with PCP and cardiologist. Patient voices that her dtr will be taking her. She states that her dtr travels for work at times. She reports that she may need transportation assistance in the future. She did not want [facility] SW referral placed at this time but ws agreeable to RN CM mailing [facility] brochure and info for future reference. Patient hs all her meds. She denies any issues or concerns regarding them. Patient aware of s/s of worsening condition and when to seek medical attention. No further RN CM needs or concerns at this time. Advised patient that they would get one more automated EMMI-GENERAL post discharge calls to assess how they are doing following recent hospitalization and will receive a call from a nurse if any of their responses were abnormal. Patient voiced understanding and was appreciative of f/u call.       Plan: RN CM will close case as no further interventions needed at this time. RN CM will send [facility] brochure via mail to patient.    Enzo Montgomery, RN,BSN,CCM [facility] Telephonic Care Management Coordinator Direct Phone: [phone] Toll Free: [phone] Fax: [phone]

## 2018-02-08 ENCOUNTER — Ambulatory Visit: Payer: Medicare Other | Admitting: Physician Assistant

## 2018-02-11 ENCOUNTER — Ambulatory Visit (INDEPENDENT_AMBULATORY_CARE_PROVIDER_SITE_OTHER): Payer: Medicare Other | Admitting: Internal Medicine

## 2018-02-11 ENCOUNTER — Ambulatory Visit (INDEPENDENT_AMBULATORY_CARE_PROVIDER_SITE_OTHER): Payer: Medicare Other | Admitting: Physician Assistant

## 2018-02-11 ENCOUNTER — Encounter: Payer: Self-pay | Admitting: Internal Medicine

## 2018-02-11 ENCOUNTER — Encounter: Payer: Self-pay | Admitting: Physician Assistant

## 2018-02-11 ENCOUNTER — Other Ambulatory Visit: Payer: Self-pay | Admitting: Internal Medicine

## 2018-02-11 VITALS — BP 110/58 | HR 67 | Ht 67.0 in | Wt 182.8 lb

## 2018-02-11 VITALS — BP 120/70 | HR 65 | Temp 97.8°F | Wt 182.0 lb

## 2018-02-11 DIAGNOSIS — R002 Palpitations: Secondary | ICD-10-CM

## 2018-02-11 DIAGNOSIS — F419 Anxiety disorder, unspecified: Secondary | ICD-10-CM | POA: Diagnosis not present

## 2018-02-11 DIAGNOSIS — M810 Age-related osteoporosis without current pathological fracture: Secondary | ICD-10-CM

## 2018-02-11 DIAGNOSIS — R0602 Shortness of breath: Secondary | ICD-10-CM | POA: Diagnosis not present

## 2018-02-11 DIAGNOSIS — J449 Chronic obstructive pulmonary disease, unspecified: Secondary | ICD-10-CM

## 2018-02-11 DIAGNOSIS — I5032 Chronic diastolic (congestive) heart failure: Secondary | ICD-10-CM | POA: Diagnosis not present

## 2018-02-11 DIAGNOSIS — I480 Paroxysmal atrial fibrillation: Secondary | ICD-10-CM | POA: Diagnosis not present

## 2018-02-11 DIAGNOSIS — R2689 Other abnormalities of gait and mobility: Secondary | ICD-10-CM | POA: Diagnosis not present

## 2018-02-11 DIAGNOSIS — R42 Dizziness and giddiness: Secondary | ICD-10-CM

## 2018-02-11 DIAGNOSIS — I272 Pulmonary hypertension, unspecified: Secondary | ICD-10-CM | POA: Diagnosis not present

## 2018-02-11 MED ORDER — ESCITALOPRAM OXALATE 5 MG PO TABS: 5.0000 mg | ORAL_TABLET | Freq: Every day | ORAL | 2 refills | Status: DC

## 2018-02-11 MED ORDER — METOPROLOL TARTRATE 25 MG PO TABS: 25.0000 mg | ORAL_TABLET | Freq: Two times a day (BID) | ORAL | 3 refills | Status: DC

## 2018-02-11 NOTE — Progress Notes (Signed)
Subjective:    Patient ID: Julie Davies, female    DOB: [birth year], 84 y.o.   MRN: [id number]  HPI  Pt presents to the clinic today for [facility] Follow up. She went to th ER 5/6 with c/o cough. She was diagnosed with asthma exacerbation and pneumonia. She was being treated with Azithromycin and Prednisone without much improved. Chest xray showed RML and RLL pneumonia. She was treated with Levaquin and Solu-Medrol. During admission, she developed a new onset Afib. Cardiology was consulted. They increased her Metoprolol to BID, changed her Diltiazem to Cardizem and started her on Eliquis. She was discharged on 5/11. Since discharge, she reports she has been feeling fairly well.  She does report some intermittent dizziness, but seems to be affecting her balance.  Otherwise, she seems to be doing okay.  She is sleeping well.  Her appetite is returning.  Her bowels and bladder are functioning normally.  Her gait is steady except during periods of dizziness.  She is walking with a cane, but would like an Rx for a walker today.  She has been experiencing a lot of anxiety related to her health at this time.  She reports feeling anxious and on edge.  She denies feeling down, depressed or hopeless.  She has never been treated for anxiety in the past.  She denies SI/HI.  Review of Systems      Past Medical History:  Diagnosis Date  . Allergy   . Arthritis   . Asthma   . Basal cell carcinoma of skin   . Chicken pox   . Diastolic dysfunction    a. TTE 5/19: EF 60-65%, mild focal basal hypertrophy, no RWMA, Gr1DD, mod dilated LA, RV cavity size nl with nl RVSF, PASP 40-45, mildly dilated IVC  . GERD (gastroesophageal reflux disease)   . History of kidney stones   . Postural dizziness with presyncope    a. TTE 8/18: EF 55-60%, nl WM, Gr1DD, mild MR, mildly dilated LA  . Pulmonary embolism on left [facility])    a. in the setting of right TKA; b. treated with Coumadin    Current Outpatient  Medications  Medication Sig Dispense Refill  . acetaminophen (TYLENOL) 500 MG tablet Take 500 mg by mouth every 4 (four) hours as needed for mild pain.     Marland Kitchen ADVAIR HFA 115-21 MCG/ACT inhaler USE 2 INHALATIONS TWICE A DAY 36 g 3  . albuterol (PROVENTIL HFA;VENTOLIN HFA) 108 (90 Base) MCG/ACT inhaler Inhale 2 puffs into the lungs every 4 (four) hours as needed for wheezing or shortness of breath. 54 g 3  . alendronate (FOSAMAX) 70 MG tablet TAKE 1 TABLET EVERY 7 DAYS WITH A FULL GLASS OF WATER ON AN EMPTY STOMACH 12 tablet 2  . alum & mag hydroxide-simeth (MAALOX/MYLANTA) 433-295-18 MG/5ML suspension Take 30 mLs by mouth every 4 (four) hours as needed for indigestion or heartburn. 355 mL 0  . Alum Hydroxide-Mag Carbonate (GAVISCON EXTRA STRENGTH) 160-105 MG CHEW Chew 2 tablets by mouth daily as needed (Acid reflux).     Marland Kitchen apixaban (ELIQUIS) 5 MG TABS tablet Take 1 tablet (5 mg total) by mouth 2 (two) times daily. 60 tablet 0  . aspirin 81 MG tablet Take 81 mg by mouth at bedtime.     . chlorpheniramine-HYDROcodone (TUSSIONEX) 10-8 MG/5ML SUER Take 5 mLs by mouth every 12 (twelve) hours as needed for cough. 115 mL 0  . Cholecalciferol (VITAMIN D) 2000 units CAPS Take 1 capsule by mouth  daily.    . Dexlansoprazole (DEXILANT) 30 MG capsule Take 1 capsule (30 mg total) by mouth daily. 90 capsule 0  . diltiazem (CARDIZEM CD) 120 MG 24 hr capsule Take 1 capsule (120 mg total) by mouth daily. 30 capsule 0  . docusate sodium (COLACE) 100 MG capsule Take 100-200 mg by mouth 2 (two) times daily. Take 200 mg by mouth in the morning and 100 mg by mouth at bedtime.    . fluconazole (DIFLUCAN) 200 MG tablet TAKE 1 TABLET BY MOUTH EVERY WEEK EVERY FRIDAY  2  . furosemide (LASIX) 20 MG tablet Take 1 tablet (20 mg total) by mouth daily. 30 tablet 0  . gabapentin (NEURONTIN) 300 MG capsule Take 300 mg by mouth 2 (two) times daily.     . Ginger, Zingiber officinalis, (GINGER EXTRACT) 250 MG CAPS Take 250 mg by mouth  daily.     Donnie Aho (GLUCOSAMINE MSM COMPLEX) TABS Take 1 tablet by mouth 2 (two) times daily.    Marland Kitchen guaiFENesin-dextromethorphan (ROBITUSSIN DM) 100-10 MG/5ML syrup Take 5 mLs by mouth every 4 (four) hours as needed for cough. 118 mL 0  . Ketotifen Fumarate ([employer] ALLERGY EYE DROPS OP) Place 1 drop into both eyes 2 (two) times daily as needed (allergies).    Marland Kitchen levocetirizine (XYZAL) 5 MG tablet Take 1 tablet (5 mg total) by mouth every evening. 30 tablet 0  . metoprolol tartrate 37.5 MG TABS Take 37.5 mg by mouth 2 (two) times daily. 100 tablet 0  . Misc Natural Products (TART CHERRY ADVANCED) CAPS Take 1 capsule by mouth 2 (two) times daily.     . Multiple Vitamins-Minerals (ONE DAILY MULTIVITAMIN WOMEN PO) Take 1 tablet by mouth daily.    . polyethylene glycol (MIRALAX / GLYCOLAX) packet Take 17 g by mouth daily as needed for mild constipation. 14 each 0  . Polyethylene Glycol 3350 GRAN Take 17 g by mouth daily.     . predniSONE (STERAPRED UNI-PAK 21 TAB) 10 MG (21) TBPK tablet Take as directed. 21 tablet 0  . simvastatin (ZOCOR) 20 MG tablet Take 1 tablet (20 mg total) by mouth daily. 90 tablet 3  . TURMERIC PO Take 300 mg by mouth at bedtime.      No current facility-administered medications for this visit.     Allergies  Allergen Reactions  . Tramadol Other (See Comments)    Pt states she had a syncopal episode and confusion with Elev BP.     Family History  Problem Relation Age of Onset  . Heart disease Father   . Stroke Maternal Uncle   . Heart disease Maternal Aunt   . Cancer Neg Hx   . Diabetes Neg Hx     Social History   Socioeconomic History  . Marital status: Widowed    Spouse name: Not on file  . Number of children: Not on file  . Years of education: Not on file  . Highest education level: Not on file  Occupational History  . Not on file  Social Needs  . Financial resource strain: Not on file  . Food insecurity:    Worry: Not on file     Inability: Not on file  . Transportation needs:    Medical: Not on file    Non-medical: Not on file  Tobacco Use  . Smoking status: Never Smoker  . Smokeless tobacco: Never Used  Substance and Sexual Activity  . Alcohol use: Yes    Alcohol/week: 0.0 oz  Comment: rare--wine  . Drug use: No  . Sexual activity: Not Currently  Lifestyle  . Physical activity:    Days per week: Not on file    Minutes per session: Not on file  . Stress: Not on file  Relationships  . Social connections:    Talks on phone: Not on file    Gets together: Not on file    Attends religious service: Not on file    Active member of club or organization: Not on file    Attends meetings of clubs or organizations: Not on file    Relationship status: Not on file  . Intimate partner violence:    Fear of current or ex partner: Not on file    Emotionally abused: Not on file    Physically abused: Not on file    Forced sexual activity: Not on file  Other Topics Concern  . Not on file  Social History Narrative  . Not on file     Constitutional: Denies fever, malaise, fatigue, headache or abrupt weight changes.  Respiratory: Denies difficulty breathing, shortness of breath, cough or sputum production.   Cardiovascular: Denies chest pain, chest tightness, palpitations or swelling in the hands or feet.  Gastrointestinal: Denies abdominal pain, bloating, constipation, diarrhea or blood in the stool.  Musculoskeletal: Patient reports difficulty with gait.  Denies decrease in range of motion,  muscle pain or joint pain and swelling.   Neurological: Patient reports intermittent dizziness, problems with balance and coordination.  Denies difficulty with memory, difficulty with speech.  Psych: Patient reports anxiety.  Denies depression, SI/HI.  No other specific complaints in a complete review of systems (except as listed in HPI above).  Objective:   Physical Exam   BP 120/70   Pulse 65   Temp 97.8 F (36.6 C)  (Oral)   Wt 182 lb (82.6 kg)   SpO2 97%   BMI 28.51 kg/m  Wt Readings from Last 3 Encounters:  02/11/18 182 lb 12 oz (82.9 kg)  02/11/18 182 lb (82.6 kg)  02/02/18 178 lb 3.2 oz (80.8 kg)    General: Appears her stated age, in NAD. Neck: No adenopathy noted.  TCM hospital follow-up for Cardiovascular: Normal rate and rhythm. S1,S2 noted.  No murmur, rubs or gallops noted.  Pulmonary/Chest: Normal effort and positive vesicular breath sounds. No respiratory distress. No wheezes, rales or ronchi noted.  Musculoskeletal: Gait slow, slightly unsteady, with use of cane. Neurological: Alert and oriented. Coordination normal.  Psychiatric: She is mildly anxious today. Judgment and thought content normal.    BMET    Component Value Date/Time   NA 137 01/31/2018 0627   K 4.1 01/31/2018 0627   CL 103 01/31/2018 0627   CO2 26 01/31/2018 0627   GLUCOSE 85 01/31/2018 0627   BUN 21 (H) 01/31/2018 0627   CREATININE 0.94 02/01/2018 0623   CALCIUM 8.7 (L) 01/31/2018 0627   GFRNONAA 53 (L) 02/01/2018 0623   GFRAA >60 02/01/2018 0623    Lipid Panel     Component Value Date/Time   CHOL 145 04/17/2017 1505   TRIG 96.0 04/17/2017 1505   HDL 58.00 04/17/2017 1505   CHOLHDL 3 04/17/2017 1505   VLDL 19.2 04/17/2017 1505   LDLCALC 68 04/17/2017 1505    CBC    Component Value Date/Time   WBC 8.2 01/31/2018 0627   RBC 4.01 01/31/2018 0627   HGB 11.9 (L) 01/31/2018 0627   HCT 34.5 (L) 01/31/2018 0627   PLT 358 01/31/2018 8099  MCV 85.9 01/31/2018 0627   MCH 29.7 01/31/2018 0627   MCHC 34.6 01/31/2018 0627   RDW 13.3 01/31/2018 0627   LYMPHSABS 0.8 (L) 01/28/2018 1224   MONOABS 0.9 01/28/2018 1224   EOSABS 0.0 01/28/2018 1224   BASOSABS 0.0 01/28/2018 1224    Hgb A1C Lab Results  Component Value Date   HGBA1C 5.6 01/31/2018           Assessment & Plan:   TCM hospital follow-up for CAP, Dizziness, paroxysmal A. fib:  Hospital notes, labs and imaging reviewed Pneumonia  seems to have resolved Still having some intermittent dizziness-encouraged rest, fluids Offered referral for physical therapy, but she declines at this time Rx provided for a rolling walker with platform. No indication for repeat labs or imaging at this time Continue current medications at this time She will follow-up with cardiology as an outpatient  Anxiety:  Related to health issues Support offered today Rx for Lexapro 5 mg tablets nightly  Advised her to update me in 1 month and let me know how she is feeling on the [place], NP

## 2018-02-11 NOTE — Patient Instructions (Signed)
Medication Instructions:  Your physician has recommended you make the following change in your medication:  1. DECREASE Metoprolol to 25 mg twice a day   Testing/Procedures: Your physician has recommended that you wear an Zio monitor. Zio monitors are medical devices that record the heart's electrical activity. Doctors most often [place] these monitors to diagnose arrhythmias. Arrhythmias are problems with the speed or rhythm of the heartbeat. The monitor is a small, portable device. You can wear one while you do your normal daily activities. This is usually used to diagnose what is causing palpitations/syncope (passing out).  [place]  Your caregiver has ordered a Stress Test with nuclear imaging. The purpose of this test is to evaluate the blood supply to your heart muscle. This procedure is referred to as a "Non-Invasive Stress Test." This is because other than having an IV started in your vein, nothing is inserted or "invades" your body. Cardiac stress tests are done to find areas of poor blood flow to the heart by determining the extent of coronary artery disease (CAD). Some patients exercise on a treadmill, which naturally increases the blood flow to your heart, while others who are  unable to walk on a treadmill due to physical limitations have a pharmacologic/chemical stress agent called Lexiscan . This medicine will mimic walking on a treadmill by temporarily increasing your coronary blood flow.   Please note: these test may take anywhere between 2-4 hours to complete  PLEASE REPORT TO [facility] AT THE FIRST DESK WILL DIRECT YOU WHERE TO GO  Date of Procedure:_____________________________________  Arrival Time for Procedure:______________________________   PLEASE NOTIFY THE OFFICE AT LEAST 24 HOURS IN ADVANCE IF YOU ARE UNABLE TO KEEP YOUR APPOINTMENT.  [phone] AND  PLEASE NOTIFY NUCLEAR MEDICINE AT [facility] AT LEAST 24 HOURS IN ADVANCE IF YOU ARE  UNABLE TO KEEP YOUR APPOINTMENT. [phone]  How to prepare for your Myoview test:  1. Do not eat or drink after midnight 2. No caffeine for 24 hours prior to test 3. No smoking 24 hours prior to test. 4. Your medication may be taken with water.  If your doctor stopped a medication because of this test, do not take that medication. 5. Ladies, please do not wear dresses.  Skirts or pants are appropriate. Please wear a short sleeve shirt. 6. No perfume, cologne or lotion. 7. Wear comfortable walking shoes. No heels!    Follow-Up: Your physician recommends that you schedule a follow-up appointment in: 6 weeks  It was a pleasure seeing you today here in the office. Please do not hesitate to give [place] a call back if you have any further questions. [place], BSN

## 2018-02-11 NOTE — Progress Notes (Signed)
Cardiology Office Note Date:  02/11/2018  Patient ID:  Julie Davies [birth year], MRN [id number] PCP:  Jearld Fenton, NP  Cardiologist:  Dr. Rockey Situ, MD    Chief Complaint: Hospital follow up  History of Present Illness: Julie Davies is a 84 y.o. female with history of recently diagnosed Afib in early 01/2018 on Eliquis, COPD, asthma, pulmonary nodules, presyncope felt to be related to orthostatic hypotension, and PE treated with Coumadin in 2013 in the setting of right knee replacement who presents for hospital follow up after recent admission to [facility] from 5/6-5/11 for AECOPD with new onset Afib with RVR.   Patient was previously evaluated in 04/2017 for presyncope with associated SOB, chest pressure, and dizziness. Echo showed an EF of 55-60%, normal wall motion, Gr1DD, mild MR, mildly dilated left atrium. Orthostatics in cardiology, ED follow up at that time were positive. Myoview was performed in 05/2017 given her symptoms which showed a small defect of moderate severity present in the apex location, felt to be breast attenuation, EF 55-65%, low risk study. She was admitted to [facility] on 5/6 with worsening SOB and wheezing following an outpatient treatment course of azithromycin and prednisone. She was found to have an AECOPD as well as new onset Afib with RVR. Echo on 01/28/2018 showed an EF of 60-65%, no RWMA, Gr1DD, mild MR, moderately dilated LA, RVSF normal, PASP 40-45. She was started on Eliquis and rate controlled. She had several episodes of brief conversion to sinus rhythm followed by recurrence of Afib with RVR, though ultimately converted to sinus rhythm on 5/9 and maintained sinus rhythm throughout her remaining admission.   Discharge labs: Mg++ 2.1, K+ 4.1, SCr 0.94, WBC 8.2, HGB 11.9, PLT 358, A1c 5.6, TSH 2.561.   She comes in accompanied by her daughter today.  She is doing reasonably well from a cardiac perspective however she continues to note generalized malaise, fatigue,  lethargy, and intermittent dizziness.  Some episodes of dizziness are associated with positional changes however others are occurring while the patient has been in a sitting position for an extended period of time.  She is uncertain if these episodes are related with palpitations or chest pressure but believes they may be.  Episodes will last approximately 15 to 20 minutes and spontaneously resolved.  Blood pressure readings at home have been in the low 818H to 631S systolic.  Prior to her hospital admission home blood pressure readings were typically in the 970Y to 637C systolic.  She has been compliant with medications and denies missing any doses of Eliquis.  No recent falls.  No melena or BRBPR.  Daughter however states this "slowing down" has been progressively getting worse over the past year and is not new.  Discharge medications:  TAKE these medications   acetaminophen 500 MG tablet Commonly known as:  TYLENOL Take 500 mg by mouth every 4 (four) hours as needed for mild pain.   albuterol 108 (90 Base) MCG/ACT inhaler Commonly known as:  PROVENTIL HFA;VENTOLIN HFA Inhale 2 puffs into the lungs every 4 (four) hours as needed for wheezing or shortness of breath.   alendronate 70 MG tablet Commonly known as:  FOSAMAX TAKE 1 TABLET EVERY 7 DAYS WITH A FULL GLASS OF WATER ON AN EMPTY STOMACH   alum & mag hydroxide-simeth 200-200-20 MG/5ML suspension Commonly known as:  MAALOX/MYLANTA Take 30 mLs by mouth every 4 (four) hours as needed for indigestion or heartburn.   apixaban 5 MG Tabs tablet Commonly known as:  ELIQUIS Take 1 tablet (5 mg total) by mouth 2 (two) times daily.   aspirin 81 MG tablet Take 81 mg by mouth at bedtime.   chlorpheniramine-HYDROcodone 10-8 MG/5ML Suer Commonly known as:  TUSSIONEX Take 5 mLs by mouth every 12 (twelve) hours as needed for cough.   [employer] ALLERGY EYE DROPS OP Place 1 drop into both eyes 2 (two) times daily as needed (allergies).     Dexlansoprazole 30 MG capsule Take 1 capsule (30 mg total) daily by mouth.   diltiazem 120 MG 24 hr capsule Commonly known as:  CARDIZEM CD Take 1 capsule (120 mg total) by mouth daily. Start taking on:  02/03/2018   docusate sodium 100 MG capsule Commonly known as:  COLACE Take 100-200 mg by mouth 2 (two) times daily. Take 200 mg by mouth in the morning and 100 mg by mouth at bedtime.   fluconazole 200 MG tablet Commonly known as:  DIFLUCAN TAKE 1 TABLET BY MOUTH EVERY WEEK EVERY FRIDAY   fluticasone-salmeterol 115-21 MCG/ACT inhaler Commonly known as:  ADVAIR HFA Inhale 2 puffs into the lungs 2 (two) times daily.   furosemide 20 MG tablet Commonly known as:  LASIX Take 1 tablet (20 mg total) by mouth daily. Start taking on:  02/03/2018   gabapentin 300 MG capsule Commonly known as:  NEURONTIN Take 300 mg by mouth 2 (two) times daily.   GAVISCON EXTRA STRENGTH 160-105 MG Chew Generic drug:  Alum Hydroxide-Mag Carbonate Chew 2 tablets by mouth daily as needed (Acid reflux).   Ginger Extract 250 MG Caps Take 250 mg by mouth daily.   GLUCOSAMINE MSM COMPLEX Tabs Take 1 tablet by mouth 2 (two) times daily.   guaiFENesin-dextromethorphan 100-10 MG/5ML syrup Commonly known as:  ROBITUSSIN DM Take 5 mLs by mouth every 4 (four) hours as needed for cough.   levocetirizine 5 MG tablet Commonly known as:  XYZAL Take 1 tablet (5 mg total) by mouth every evening.   levofloxacin 500 MG tablet Commonly known as:  LEVAQUIN Take 1 tablet (500 mg total) by mouth daily for 7 days.   Metoprolol Tartrate 37.5 MG Tabs Take 37.5 mg by mouth 2 (two) times daily.   ONE DAILY MULTIVITAMIN WOMEN PO Take 1 tablet by mouth daily.   Polyethylene Glycol 3350 Gran Take 17 g by mouth daily.   polyethylene glycol packet Commonly known as:  MIRALAX / GLYCOLAX Take 17 g by mouth daily as needed for mild constipation.   predniSONE 10 MG (21) Tbpk tablet Commonly known  as:  STERAPRED UNI-PAK 21 TAB Take as directed.   simvastatin 20 MG tablet Commonly known as:  ZOCOR Take 1 tablet (20 mg total) by mouth daily.   TART CHERRY ADVANCED Caps Take 1 capsule by mouth 2 (two) times daily.   TURMERIC PO Take 300 mg by mouth at bedtime.   Vitamin D 2000 units Caps Take 1 capsule by mouth daily.     Past Medical History:  Diagnosis Date  . Allergy   . Arthritis   . Asthma   . Basal cell carcinoma of skin   . Chicken pox   . Diastolic dysfunction    a. TTE 5/19: EF 60-65%, mild focal basal hypertrophy, no RWMA, Gr1DD, mod dilated LA, RV cavity size nl with nl RVSF, PASP 40-45, mildly dilated IVC  . GERD (gastroesophageal reflux disease)   . History of kidney stones   . PAF (paroxysmal atrial fibrillation) ([place])    a. diagnosed 5/19; b. CHADS2VASc =>  4 (age x 2, vascular disease - noted on CT chest 7/18, female); c. Eliquis  . Postural dizziness with presyncope    a. TTE 8/18: EF 55-60%, nl WM, Gr1DD, mild MR, mildly dilated LA  . Pulmonary embolism on left [facility])    a. in the setting of right TKA; b. treated with Coumadin    Past Surgical History:  Procedure Laterality Date  . APPENDECTOMY  1935  . DILATION AND CURETTAGE OF UTERUS  1975  . NEPHRECTOMY Right 1975  . REPLACEMENT TOTAL KNEE BILATERAL  2013  . VEIN LIGATION AND STRIPPING Left 1971    Current Meds  Medication Sig  . acetaminophen (TYLENOL) 500 MG tablet Take 500 mg by mouth every 4 (four) hours as needed for mild pain.   Marland Kitchen ADVAIR HFA 115-21 MCG/ACT inhaler USE 2 INHALATIONS TWICE A DAY  . albuterol (PROVENTIL HFA;VENTOLIN HFA) 108 (90 Base) MCG/ACT inhaler Inhale 2 puffs into the lungs every 4 (four) hours as needed for wheezing or shortness of breath.  Marland Kitchen alendronate (FOSAMAX) 70 MG tablet TAKE 1 TABLET EVERY 7 DAYS WITH A FULL GLASS OF WATER ON AN EMPTY STOMACH  . alum & mag hydroxide-simeth (MAALOX/MYLANTA) 200-200-20 MG/5ML suspension Take 30 mLs by mouth every 4 (four)  hours as needed for indigestion or heartburn.  . Alum Hydroxide-Mag Carbonate (GAVISCON EXTRA STRENGTH) 160-105 MG CHEW Chew 2 tablets by mouth daily as needed (Acid reflux).   Marland Kitchen apixaban (ELIQUIS) 5 MG TABS tablet Take 1 tablet (5 mg total) by mouth 2 (two) times daily.  Marland Kitchen aspirin 81 MG tablet Take 81 mg by mouth at bedtime.   . chlorpheniramine-HYDROcodone (TUSSIONEX) 10-8 MG/5ML SUER Take 5 mLs by mouth every 12 (twelve) hours as needed for cough.  . Cholecalciferol (VITAMIN D) 2000 units CAPS Take 1 capsule by mouth daily.  Marland Kitchen Dexlansoprazole (DEXILANT) 30 MG capsule Take 1 capsule (30 mg total) by mouth daily.  Marland Kitchen diltiazem (CARDIZEM CD) 120 MG 24 hr capsule Take 1 capsule (120 mg total) by mouth daily.  Marland Kitchen docusate sodium (COLACE) 100 MG capsule Take 100-200 mg by mouth 2 (two) times daily. Take 200 mg by mouth in the morning and 100 mg by mouth at bedtime.  Marland Kitchen escitalopram (LEXAPRO) 5 MG tablet Take 1 tablet (5 mg total) by mouth daily.  . fluconazole (DIFLUCAN) 200 MG tablet TAKE 1 TABLET BY MOUTH EVERY WEEK EVERY FRIDAY  . furosemide (LASIX) 20 MG tablet Take 1 tablet (20 mg total) by mouth daily.  Marland Kitchen gabapentin (NEURONTIN) 300 MG capsule Take 300 mg by mouth 2 (two) times daily.   . Ginger, Zingiber officinalis, (GINGER EXTRACT) 250 MG CAPS Take 250 mg by mouth daily.   Donnie Aho (GLUCOSAMINE MSM COMPLEX) TABS Take 1 tablet by mouth 2 (two) times daily.  Marland Kitchen guaiFENesin-dextromethorphan (ROBITUSSIN DM) 100-10 MG/5ML syrup Take 5 mLs by mouth every 4 (four) hours as needed for cough.  . Ketotifen Fumarate ([employer] ALLERGY EYE DROPS OP) Place 1 drop into both eyes 2 (two) times daily as needed (allergies).  Marland Kitchen levocetirizine (XYZAL) 5 MG tablet Take 1 tablet (5 mg total) by mouth every evening.  . metoprolol tartrate 37.5 MG TABS Take 37.5 mg by mouth 2 (two) times daily.  . Misc Natural Products (TART CHERRY ADVANCED) CAPS Take 1 capsule by mouth 2 (two) times daily.   .  Multiple Vitamins-Minerals (ONE DAILY MULTIVITAMIN WOMEN PO) Take 1 tablet by mouth daily.  . polyethylene glycol (MIRALAX / GLYCOLAX) packet Take 17  g by mouth daily as needed for mild constipation.  . Polyethylene Glycol 3350 GRAN Take 17 g by mouth daily.   . predniSONE (STERAPRED UNI-PAK 21 TAB) 10 MG (21) TBPK tablet Take as directed.  . simvastatin (ZOCOR) 20 MG tablet Take 1 tablet (20 mg total) by mouth daily.  . TURMERIC PO Take 300 mg by mouth at bedtime.     Allergies:   Tramadol   Social History:  The patient  reports that she has never smoked. She has never used smokeless tobacco. She reports that she drinks alcohol. She reports that she does not use drugs.   Family History:  The patient's family history includes Heart disease in her father and maternal aunt; Stroke in her maternal uncle.  ROS:   Review of Systems  Constitutional: Positive for malaise/fatigue. Negative for chills, diaphoresis, fever and weight loss.  HENT: Negative for congestion.   Eyes: Negative for discharge and redness.  Respiratory: Positive for shortness of breath. Negative for cough, hemoptysis, sputum production and wheezing.   Cardiovascular: Negative for chest pain, palpitations, orthopnea, claudication, leg swelling and PND.  Gastrointestinal: Negative for abdominal pain, blood in stool, heartburn, melena, nausea and vomiting.  Genitourinary: Negative for hematuria.  Musculoskeletal: Negative for falls and myalgias.  Skin: Negative for rash.  Neurological: Positive for dizziness and weakness. Negative for tingling, tremors, sensory change, speech change, focal weakness and loss of consciousness.  Endo/Heme/Allergies: Does not bruise/bleed easily.  Psychiatric/Behavioral: Negative for substance abuse. The patient is not nervous/anxious.   All other systems reviewed and are negative.    PHYSICAL EXAM:  VS:  BP (!) 110/58 (BP Location: Left Arm, Patient Position: Sitting, Cuff Size: Normal)    Pulse 67   Ht 5\' 7"  (1.702 m)   Wt 182 lb 12 oz (82.9 kg)   SpO2 98%   BMI 28.62 kg/m  BMI: Body mass index is 28.62 kg/m.  Physical Exam  Constitutional: She is oriented to person, place, and time. She appears well-developed and well-nourished.  HENT:  Head: Normocephalic and atraumatic.  Eyes: Right eye exhibits no discharge. Left eye exhibits no discharge.  Neck: Normal range of motion. No JVD present.  Cardiovascular: Normal rate, regular rhythm, S1 normal, S2 normal and normal heart sounds. Exam reveals no distant heart sounds, no friction rub, no midsystolic click and no opening snap.  No murmur heard. Pulses:      Posterior tibial pulses are 2+ on the right side, and 2+ on the left side.  Pulmonary/Chest: Effort normal and breath sounds normal. No respiratory distress. She has no decreased breath sounds. She has no wheezes. She has no rales. She exhibits no tenderness.  Abdominal: Soft. She exhibits no distension. There is no tenderness.  Musculoskeletal: She exhibits no edema.  Neurological: She is alert and oriented to person, place, and time.  Skin: Skin is warm and dry. No cyanosis. Nails show no clubbing.  Psychiatric: She has a normal mood and affect. Her speech is normal and behavior is normal. Judgment and thought content normal.     EKG:  Was ordered and interpreted by me today. Shows NSR, 67 bpm, left axis deviation, LVH with nonspecific IVCD, poor R wave progression, no acute ST-T changes  Recent Labs: 01/28/2018: ALT 31 01/31/2018: BUN 21; Hemoglobin 11.9; Magnesium 2.1; Platelets 358; Potassium 4.1; Sodium 137; TSH 2.561 02/01/2018: Creatinine, Ser 0.94  04/17/2017: Cholesterol 145; HDL 58.00; LDL Cholesterol 68; Total CHOL/HDL Ratio 3; Triglycerides 96.0; VLDL 19.2   Estimated Creatinine  Clearance: 45.8 mL/min (by C-G formula based on SCr of 0.94 mg/dL).   Wt Readings from Last 3 Encounters:  02/11/18 182 lb 12 oz (82.9 kg)  02/11/18 182 lb (82.6 kg)  02/02/18  178 lb 3.2 oz (80.8 kg)     Other studies reviewed: Additional studies/records reviewed today include: summarized above  ASSESSMENT AND PLAN:  1. PAF: Currently maintaining sinus rhythm.  However, while the patient was admitted she had frequent paroxysms of A. fib with brief episodes of spontaneous conversion to sinus rhythm.  Given the patient's complaints of shortness of breath/dizziness as detailed below with associated possible palpitations we will schedule the patient to wear a Zio monitor to evaluate for A. fib burden.  Should this show breakthrough episodes of A. fib we would likely plan to transition her to rhythm control strategy.  Continue Eliquis 5 mg twice daily.  Decrease metoprolol to 25 mg twice daily given possible interaction with her COPD as well as possible orthostatic dizziness.  Continue Cardizem CD 120 mg daily.  2. Chronic diastolic CHF/pulmonary hypertension/shortness of breath: Her shortness of breath is likely multifactorial including COPD, diastolic CHF, pulmonary hypertension, and physical deconditioning.  Continue Lasix 20 mg daily.  Schedule Lexiscan Myoview to evaluate for high risk ischemia given shortness of breath with possible chest pressure.  Pulmonary hypertension likely driven by COPD secondary to tobacco abuse.  If shortness of breath persists could consider right and left heart catheterization.  3. Dizziness/palpitations: Currently asymptomatic.  Decrease metoprolol to 25 mg twice daily for possible orthostatic dizziness.  Schedule outpatient cardiac monitoring as above.  4. COPD: Likely playing a role in some of the patient's shortness of breath.  Decrease metoprolol as above.  Follow-up with pulmonology as directed.  Disposition: F/u with myself in 6 weeks.  Current medicines are reviewed at length with the patient today.  The patient did not have any concerns regarding medicines.  Signed, Christell Faith, PA-C 02/11/2018 3:36 PM     [place] [place] [place], [place] [address] [phone]

## 2018-02-12 NOTE — Telephone Encounter (Signed)
Ok to continue. Only needs treatment until the end of August 2019, will need to D/C after that.

## 2018-02-12 NOTE — Telephone Encounter (Signed)
Please advise if okay for pt to continue medication

## 2018-02-14 ENCOUNTER — Ambulatory Visit
Admission: RE | Admit: 2018-02-14 | Discharge: 2018-02-14 | Disposition: A | Discharge status: Home / Self Care | Payer: Medicare Other | Source: Ambulatory Visit | Attending: Physician Assistant | Admitting: Physician Assistant

## 2018-02-14 DIAGNOSIS — R0602 Shortness of breath: Secondary | ICD-10-CM | POA: Insufficient documentation

## 2018-02-14 LAB — NM MYOCAR MULTI W/SPECT W/WALL MOTION / EF
CHL CUP MPHR: 132 {beats}/min
CHL CUP NUCLEAR SDS: 0
CHL CUP NUCLEAR SRS: 20
CSEPED: 0 min
CSEPEW: 1 METS
Exercise duration (sec): 0 s
LV dias vol: 86 mL (ref 46–106)
LV sys vol: 31 mL
NUC STRESS TID: 1
Peak HR: 73 {beats}/min
Percent HR: 70 %
Rest HR: 73 {beats}/min
SSS: 13

## 2018-02-14 MED ORDER — REGADENOSON 0.4 MG/5ML IV SOLN
0.4000 mg | Freq: Once | INTRAVENOUS | Status: AC
  Administered 2018-02-14: 0.4 mg via INTRAVENOUS
  Filled 2018-02-14: qty 5

## 2018-02-14 MED ORDER — TECHNETIUM TC 99M TETROFOSMIN IV KIT
10.0000 | PACK | Freq: Once | INTRAVENOUS | Status: AC | PRN
  Administered 2018-02-14: 13.48 via INTRAVENOUS

## 2018-02-14 MED ORDER — TECHNETIUM TC 99M TETROFOSMIN IV KIT
30.0000 | PACK | Freq: Once | INTRAVENOUS | Status: AC | PRN
  Administered 2018-02-14: 32.58 via INTRAVENOUS

## 2018-02-15 ENCOUNTER — Ambulatory Visit (INDEPENDENT_AMBULATORY_CARE_PROVIDER_SITE_OTHER): Payer: Medicare Other

## 2018-02-15 DIAGNOSIS — R002 Palpitations: Secondary | ICD-10-CM

## 2018-02-17 ENCOUNTER — Encounter: Payer: Self-pay | Admitting: Internal Medicine

## 2018-02-17 NOTE — Patient Instructions (Signed)

## 2018-02-28 ENCOUNTER — Other Ambulatory Visit: Payer: Self-pay | Admitting: *Deleted

## 2018-02-28 ENCOUNTER — Telehealth: Payer: Self-pay | Admitting: Cardiovascular Disease

## 2018-02-28 ENCOUNTER — Telehealth: Payer: Self-pay | Admitting: Internal Medicine

## 2018-02-28 NOTE — Telephone Encounter (Signed)
Copied from [place] [id number]. Topic: Quick Communication - See Telephone Encounter >> Feb 28, 2018 12:50 PM Vernona Rieger wrote: CRM for notification. See Telephone encounter for: 02/28/18.  Patient states she was in the office on 5/20 and seen Baity. She said she told her she needed a couple xray's done. She said would like a call back @ [phone]

## 2018-02-28 NOTE — Telephone Encounter (Signed)
Please advise if ok to refill TG not original prescriber.

## 2018-02-28 NOTE — Telephone Encounter (Signed)
°*  STAT* If patient is at the pharmacy, call can be transferred to refill team.   1. Which medications need to be refilled? (please list name of each medication and dose if known)  eliquis  Furosemide Diltiazem   2. Which pharmacy/location (including street and city if local pharmacy) is medication to be sent to? [facility] on [address]   3. Do they need a 30 day or 90 day supply? 90 day

## 2018-02-28 NOTE — Telephone Encounter (Signed)
Spoke to pt and she is aware per Baity's previous note, there is no indication to repeat imaging at this tim... Pt expressed understanding

## 2018-03-01 ENCOUNTER — Other Ambulatory Visit: Payer: Self-pay | Admitting: *Deleted

## 2018-03-01 ENCOUNTER — Other Ambulatory Visit: Payer: Self-pay

## 2018-03-01 MED ORDER — DILTIAZEM HCL ER COATED BEADS 120 MG PO CP24: 120.0000 mg | ORAL_CAPSULE | Freq: Every day | ORAL | 0 refills | Status: DC

## 2018-03-01 MED ORDER — DILTIAZEM HCL ER COATED BEADS 120 MG PO CP24: 120.0000 mg | ORAL_CAPSULE | Freq: Every day | ORAL | 2 refills | Status: DC

## 2018-03-01 MED ORDER — FUROSEMIDE 20 MG PO TABS: 20.0000 mg | ORAL_TABLET | Freq: Every day | ORAL | 2 refills | Status: DC

## 2018-03-01 MED ORDER — FUROSEMIDE 20 MG PO TABS: 20.0000 mg | ORAL_TABLET | Freq: Every day | ORAL | 0 refills | Status: DC

## 2018-03-01 NOTE — Telephone Encounter (Signed)
Please review for Eliquis refill,  Other 2 medications have already been refilled.   Thanks!   Requested Prescriptions   Signed Prescriptions Disp Refills  . diltiazem (CARDIZEM CD) 120 MG 24 hr capsule 90 capsule 0    Sig: Take 1 capsule (120 mg total) by mouth daily.    Authorizing Provider: Rise Mu    Ordering User: Janan Ridge furosemide (LASIX) 20 MG tablet 90 tablet 0    Sig: Take 1 tablet (20 mg total) by mouth daily.    Authorizing Provider: Rise Mu    Ordering User: Janan Ridge

## 2018-03-01 NOTE — Telephone Encounter (Signed)
OK to refill  Thanks

## 2018-03-01 NOTE — Telephone Encounter (Signed)
Requested Prescriptions   Signed Prescriptions Disp Refills  . diltiazem (CARDIZEM CD) 120 MG 24 hr capsule 90 capsule 0    Sig: Take 1 capsule (120 mg total) by mouth daily.    Authorizing Provider: Rise Mu    Ordering User: Janan Ridge furosemide (LASIX) 20 MG tablet 90 tablet 0    Sig: Take 1 tablet (20 mg total) by mouth daily.    Authorizing Provider: Rise Mu    Ordering User: Janan Ridge

## 2018-03-04 MED ORDER — APIXABAN 5 MG PO TABS: 5.0000 mg | ORAL_TABLET | Freq: Two times a day (BID) | ORAL | 6 refills | Status: DC

## 2018-03-07 DIAGNOSIS — I480 Paroxysmal atrial fibrillation: Secondary | ICD-10-CM | POA: Diagnosis not present

## 2018-03-12 ENCOUNTER — Telehealth: Payer: Self-pay | Admitting: Internal Medicine

## 2018-03-12 ENCOUNTER — Telehealth: Payer: Self-pay | Admitting: Cardiovascular Disease

## 2018-03-12 NOTE — Telephone Encounter (Signed)
Copied from [place] [id number]. Topic: Quick Communication - See Telephone Encounter >> Mar 12, 2018  3:34 PM Mylinda Latina, NT wrote: CRM for notification. See Telephone encounter for: 03/12/18. Patient called and states she needs a refill of her escitalopram (LEXAPRO) 5 MG tablet sent to [facility] instead of [facility].   [place], [place] - [address] [phone] (Phone) [phone] (Fax)

## 2018-03-12 NOTE — Telephone Encounter (Signed)
Please review for refill, Thanks !  

## 2018-03-12 NOTE — Telephone Encounter (Signed)
°*  STAT* If patient is at the pharmacy, call can be transferred to refill team.   1. Which medications need to be refilled? (please list name of each medication and dose if known) Eliquis   2. Which pharmacy/location (including street and city if local pharmacy) is medication to [facility] sent to?[facility]   3. Do they need a 30 day or 90 day supply? 90 day

## 2018-03-13 MED ORDER — APIXABAN 5 MG PO TABS: 5.0000 mg | ORAL_TABLET | Freq: Two times a day (BID) | ORAL | 3 refills | Status: DC

## 2018-03-13 MED ORDER — ESCITALOPRAM OXALATE 5 MG PO TABS: 5.0000 mg | ORAL_TABLET | Freq: Every day | ORAL | 2 refills | Status: DC

## 2018-03-22 NOTE — Progress Notes (Deleted)
Cardiology Office Note Date:  03/22/2018  Patient ID:  Julie Davies, Julie Davies 1927-03-08, MRN [id number] PCP:  Jearld Fenton, NP  Cardiologist:  Dr. Rockey Situ, MD  ***refresh   Chief Complaint: Follow up  History of Present Illness: Julie Davies is a 84 y.o. female with history of recently diagnosed Afib in early 01/2018 on Eliquis, COPD, asthma, pulmonary nodules, presyncope felt to be related to orthostatic hypotension, and PE treated with Coumadin in 2013 in the setting of right knee replacement who presents for follow up of her Afib.   Patient was previously evaluated in 04/2017 for presyncope with associated SOB, chest pressure, and dizziness. Echo showed an EF of 55-60%, normal wall motion, Gr1DD, mild MR, mildly dilated left atrium. Orthostatics in cardiology, ED follow up at that time were positive. Myoview was performed in 05/2017 given her symptoms which showed a small defect of moderate severity present in the apex location, felt to be breast attenuation, EF 55-65%, low risk study. She was admitted to the hospital in 01/2018 with worsening SOB and wheezing following outpatient treatment with azithromycin and prednisone. She was noted to be in new onset Afib with RVR. Echo on 01/28/2018 showed an EF of 60-65%, no RWMA, Gr1DD, mild MR, moderately dilated LA, RVSF normal, PASP 40-45. She was started on Eliquis and rate controlled. She had several episodes of brief conversion to sinus rhythm followed by recurrence of Afib with RVR, though ultimately converted to sinus rhythm on 5/9 and maintained sinus rhythm throughout her remaining admission. In hospital follow up on 5/20 she was doing well, though noted continued fatigue, lethargy, and dizziness. She remained in sinus rhythm. Metoprolol was decreased to 25 mg bid. Zio monitor showed NSR with an average heart rate of 72 bpm. She was noted to have an Afib burden of 14% with heart rates ranging from 57-155 bpm (avgerage of 90 bpm), the longest  episode of Afib lasted 14 hours 8 minutes with an average rate of 94 bpm. Afib was detected within +/- 45 seconds of symptomatic patient events, rare PACs and atrial couplets/triplets, rare ventricular couplets.   ***  Past Medical History:  Diagnosis Date  . Allergy   . Arthritis   . Asthma   . Basal cell carcinoma of skin   . Chicken pox   . Diastolic dysfunction    a. TTE 5/19: EF 60-65%, mild focal basal hypertrophy, no RWMA, Gr1DD, mod dilated LA, RV cavity size nl with nl RVSF, PASP 40-45, mildly dilated IVC  . GERD (gastroesophageal reflux disease)   . History of kidney stones   . PAF (paroxysmal atrial fibrillation) ([place])    a. diagnosed 5/19; b. CHADS2VASc => 4 (age x 2, vascular disease - noted on CT chest 7/18, female); c. Eliquis  . Postural dizziness with presyncope    a. TTE 8/18: EF 55-60%, nl WM, Gr1DD, mild MR, mildly dilated LA  . Pulmonary embolism on left [facility])    a. in the setting of right TKA; b. treated with Coumadin    Past Surgical History:  Procedure Laterality Date  . APPENDECTOMY  1935  . DILATION AND CURETTAGE OF UTERUS  1975  . NEPHRECTOMY Right 1975  . REPLACEMENT TOTAL KNEE BILATERAL  2013  . VEIN LIGATION AND STRIPPING Left 1971    No outpatient medications have been marked as taking for the 03/25/18 encounter (Appointment) with Rise Mu, PA-C.    Allergies:   Tramadol   Social History:  The patient  reports that  she has never smoked. She has never used smokeless tobacco. She reports that she drinks alcohol. She reports that she does not use drugs.   Family History:  The patient's family history includes Heart disease in her father and maternal aunt; Stroke in her maternal uncle.  ROS:   ROS   PHYSICAL EXAM: *** VS:  There were no vitals taken for this visit. BMI: There is no height or weight on file to calculate BMI.  Physical Exam   EKG:  Was ordered and interpreted by me today. Shows ***  Recent Labs: 01/28/2018: ALT  31 01/31/2018: BUN 21; Hemoglobin 11.9; Magnesium 2.1; Platelets 358; Potassium 4.1; Sodium 137; TSH 2.561 02/01/2018: Creatinine, Ser 0.94  04/17/2017: Cholesterol 145; HDL 58.00; LDL Cholesterol 68; Total CHOL/HDL Ratio 3; Triglycerides 96.0; VLDL 19.2   CrCl cannot be calculated (Patient's most recent lab result is older than the maximum 21 days allowed.).   Wt Readings from Last 3 Encounters:  02/11/18 182 lb 12 oz (82.9 kg)  02/11/18 182 lb (82.6 kg)  02/02/18 178 lb 3.2 oz (80.8 kg)     Other studies reviewed: Additional studies/records reviewed today include: summarized above  ASSESSMENT AND PLAN:  1. ***  Disposition: F/u with *** in   Current medicines are reviewed at length with the patient today.  The patient did not have any concerns regarding medicines.  Signed, Christell Faith, PA-C 03/22/2018 10:04 AM     [place], [place] [address] [phone]

## 2018-03-25 ENCOUNTER — Ambulatory Visit: Payer: Medicare Other | Admitting: Physician Assistant

## 2018-04-12 ENCOUNTER — Ambulatory Visit (INDEPENDENT_AMBULATORY_CARE_PROVIDER_SITE_OTHER): Payer: Medicare Other | Admitting: Nurse Practitioner

## 2018-04-12 ENCOUNTER — Encounter: Payer: Self-pay | Admitting: Nurse Practitioner

## 2018-04-12 VITALS — BP 145/82 | HR 64 | Ht 67.0 in | Wt 184.0 lb

## 2018-04-12 DIAGNOSIS — I10 Essential (primary) hypertension: Secondary | ICD-10-CM | POA: Diagnosis not present

## 2018-04-12 DIAGNOSIS — R0789 Other chest pain: Secondary | ICD-10-CM

## 2018-04-12 DIAGNOSIS — I480 Paroxysmal atrial fibrillation: Secondary | ICD-10-CM | POA: Diagnosis not present

## 2018-04-12 NOTE — Progress Notes (Signed)
Office Visit    Patient Name: Julie Davies Date of Encounter: 04/12/2018  Primary Care Provider:  Jearld Fenton, NP Primary Cardiologist:  Ida Rogue, MD  Chief Complaint    84 year old female with a history of paroxysmal atrial fibrillation, COPD, asthma, benign pulmonary nodules, presyncope/orthostasis, diastolic dysfunction, and PE in 2013, who presents for follow-up.  Past Medical History    Past Medical History:  Diagnosis Date  . Allergy   . Arthritis   . Asthma   . Basal cell carcinoma of skin   . Chicken pox   . Diastolic dysfunction    a. TTE 5/19: EF 60-65%, mild focal basal hypertrophy, no RWMA, Gr1DD, mod dilated LA, RV cavity size nl with nl RVSF, PASP 40-45, mildly dilated IVC  . GERD (gastroesophageal reflux disease)   . Hiatal hernia    a. noted on Chest CT 03/2017.  Marland Kitchen History of cardiovascular stress test    a. 01/2018 MV: No ischemia. Fixed antsept, apical inferior defects - likely 2/2 GI uptake artifact (attenuation corrected images not available). Low risk.  Marland Kitchen History of kidney stones   . PAF (paroxysmal atrial fibrillation) ([place])    a. diagnosed 5/19; b. CHADS2VASc = 4 (age x 2, vascular disease - noted on CT chest 7/18, female)--> Eliquis; c. 01/2018 Zio Monitor: 14% Afib.  Marland Kitchen Postural dizziness with presyncope    a. TTE 8/18: EF 55-60%, nl WM, Gr1DD, mild MR, mildly dilated LA  . Pulmonary embolism on left [facility])    a. in the setting of right TKA; b. treated with Coumadin  . Pulmonary nodules    a. 03/2017 Chest CT: resolution of RUL ground glass nodule. Stable, numerous small bilat pulm nodules since 2016-->benign.   Past Surgical History:  Procedure Laterality Date  . APPENDECTOMY  1935  . DILATION AND CURETTAGE OF UTERUS  1975  . NEPHRECTOMY Right 1975  . REPLACEMENT TOTAL KNEE BILATERAL  2013  . VEIN LIGATION AND STRIPPING Left 1971    Allergies  Allergies  Allergen Reactions  . Tramadol Other (See Comments)    Pt states she had  a syncopal episode and confusion with Elev BP.     History of Present Illness    84 year old female with the above complex past medical history including paroxysmal atrial fibrillation, COPD, asthma, benign pulmonary nodules, presyncope, orthostatic hypotension, and pulmonary embolism in 2013 following right total knee arthroplasty.  In August 2018, she was evaluated for presyncope with associated dyspnea, chest pressure, and dizziness.  Echocardiogram showed normal LV function and she was subsequently found to be orthostatic on examination.  Stress testing in September 2018 showed breast attenuation without ischemia.    In May of this year, she was admitted to [facility] regional with acute exacerbation of COPD and also new onset of A. fib with RVR.  Echocardiogram showed normal LV function with grade 1 diastolic dysfunction and moderately elevated pulmonary pressures-40 to 45 mmHg.  She was placed on beta-blocker, calcium channel blocker, and Eliquis therapy and was recently rate controlled with intermittent conversion to sinus rhythm and recurrent PAF.  She was seen in clinic in late May and noted generalized malaise, fatigue, lethargy, and intermittent dizziness.  She was also experiencing intermittent palpitations and chest pressure.  In that setting, stress testing was undertaken and was felt to be low risk and without ischemia.  There were fixed defects noted in the anteroseptal and apical inferior walls but this was felt most likely to be secondary to GI  uptake as no wall motion ab normalities were noted.  She was also placed on a 2-week ZIO monitor which showed 14% A. fib burden.  The longest spell lasted 14 hours and 8 minutes with an average rate of 94 bpm.  Maximum heart rate was 155 bpm.  Though she had a few triggered episodes associated with A. fib, she had other triggered events that were associated with sinus rhythm.  Since her last visit, she has been mostly doing well.  She has been using a  walker not so much because she is unsteady but so that she can rest and sit on the attached seat if necessary.  She walks approximately 1 mile every morning.  She will often walk 1/2 mile without stopping, rest, and then turn around and go back home.  About once a week, she might have an episode of either dyspnea, dizziness, or chest tightness.  She denies any palpitations.  Symptoms typically last a few minutes and resolve spontaneously.  She has a history of orthostasis and continues to have orthostatic symptoms if she stoops and then gets up too fast.  Overall, she is pleased with her current medical management and is eager to return to her exercise classes.  Home Medications    Prior to Admission medications   Medication Sig Start Date End Date Taking? Authorizing Provider  acetaminophen (TYLENOL) 500 MG tablet Take 500 mg by mouth every 4 (four) hours as needed for mild pain.     [provider]  ADVAIR [facility] 277-41 MCG/ACT inhaler USE 2 INHALATIONS TWICE A DAY 02/05/18   Flora Lipps, MD  albuterol (PROVENTIL HFA;VENTOLIN HFA) 108 (90 Base) MCG/ACT inhaler Inhale 2 puffs into the lungs every 4 (four) hours as needed for wheezing or shortness of breath. 10/20/16   Jearld Fenton, NP  alendronate (FOSAMAX) 70 MG tablet TAKE 1 TABLET EVERY 7 DAYS WITH A FULL GLASS OF WATER ON AN EMPTY STOMACH 02/12/18   Jearld Fenton, NP  alum & mag hydroxide-simeth (MAALOX/MYLANTA) 200-200-20 MG/5ML suspension Take 30 mLs by mouth every 4 (four) hours as needed for indigestion or heartburn. 02/02/18   Nicholes Mango, MD  Alum Hydroxide-Mag Carbonate (GAVISCON EXTRA STRENGTH) 160-105 MG CHEW Chew 2 tablets by mouth daily as needed (Acid reflux).     [provider]  apixaban (ELIQUIS) 5 MG TABS tablet Take 1 tablet (5 mg total) by mouth 2 (two) times daily. 03/13/18   Minna Merritts, MD  aspirin 81 MG tablet Take 81 mg by mouth at bedtime.     [provider]  chlorpheniramine-HYDROcodone  (TUSSIONEX) 10-8 MG/5ML SUER Take 5 mLs by mouth every 12 (twelve) hours as needed for cough. 02/02/18   Nicholes Mango, MD  Cholecalciferol (VITAMIN D) 2000 units CAPS Take 1 capsule by mouth daily.    [provider]  Dexlansoprazole (DEXILANT) 30 MG capsule Take 1 capsule (30 mg total) by mouth daily. 02/04/18   [name], MD  diltiazem (CARDIZEM CD) 120 MG 24 hr capsule Take 1 capsule (120 mg total) by mouth daily. 03/01/18   Minna Merritts, MD  diltiazem (CARDIZEM CD) 120 MG 24 hr capsule Take 1 capsule (120 mg total) by mouth daily. 03/01/18   Dunn, Areta Haber, PA-C  docusate sodium (COLACE) 100 MG capsule Take 100-200 mg by mouth 2 (two) times daily. Take 200 mg by mouth in the morning and 100 mg by mouth at bedtime.    [provider]  escitalopram (LEXAPRO) 5  MG tablet Take 1 tablet (5 mg total) by mouth daily. 03/13/18   Jearld Fenton, NP  fluconazole (DIFLUCAN) 200 MG tablet TAKE 1 TABLET BY MOUTH EVERY WEEK EVERY FRIDAY 11/14/17   [provider]  furosemide (LASIX) 20 MG tablet Take 1 tablet (20 mg total) by mouth daily. 03/01/18   Minna Merritts, MD  furosemide (LASIX) 20 MG tablet Take 1 tablet (20 mg total) by mouth daily. 03/01/18   Dunn, Areta Haber, PA-C  gabapentin (NEURONTIN) 300 MG capsule Take 300 mg by mouth 2 (two) times daily.  06/10/15   [provider]  Ginger, Zingiber officinalis, (GINGER EXTRACT) 250 MG CAPS Take 250 mg by mouth daily.     [provider]  Glucos-MSM-C-Mn-Ginger-Willow (GLUCOSAMINE MSM COMPLEX) TABS Take 1 tablet by mouth 2 (two) times daily.    [provider]  guaiFENesin-dextromethorphan (ROBITUSSIN DM) 100-10 MG/5ML syrup Take 5 mLs by mouth every 4 (four) hours as needed for cough. 02/02/18   Nicholes Mango, MD  Ketotifen Fumarate ([employer] ALLERGY EYE DROPS OP) Place 1 drop into both eyes 2 (two) times daily as needed (allergies).    [provider]  levocetirizine (XYZAL) 5 MG tablet Take 1 tablet (5 mg  total) by mouth every evening. 09/10/14   Jearld Fenton, NP  metoprolol tartrate (LOPRESSOR) 25 MG tablet Take 1 tablet (25 mg total) by mouth 2 (two) times daily. 02/11/18 05/12/18  Rise Mu, PA-C  Misc Natural Products (TART CHERRY ADVANCED) CAPS Take 1 capsule by mouth 2 (two) times daily.     [provider]  Multiple Vitamins-Minerals (ONE DAILY MULTIVITAMIN WOMEN PO) Take 1 tablet by mouth daily.    [provider]  polyethylene glycol (MIRALAX / GLYCOLAX) packet Take 17 g by mouth daily as needed for mild constipation. 02/02/18   Nicholes Mango, MD  Polyethylene Glycol 3350 GRAN Take 17 g by mouth daily.     [provider]  predniSONE (STERAPRED UNI-PAK 21 TAB) 10 MG (21) TBPK tablet Take as directed. 02/02/18   Gouru, Illene Silver, MD  simvastatin (ZOCOR) 20 MG tablet Take 1 tablet (20 mg total) by mouth daily. 05/22/17   Minna Merritts, MD  TURMERIC PO Take 300 mg by mouth at bedtime.     [provider]    Review of Systems    Overall doing better than when last seen.  She occasionally will have lightheadedness usually associated with rapid position changes.  Also continues have intermittent episodes of dyspnea or chest tightness which resolved with rest.  No palpitations.  She denies PND, orthopnea, syncope, or early satiety.  She sometimes notes mild lower extremity swelling..  All other systems reviewed and are otherwise negative except as noted above.  Physical Exam    VS:  BP (!) 145/82 (BP Location: Right Arm, Patient Position: Sitting, Cuff Size: Normal)   Pulse 64   Ht 5\' 7"  (1.702 m)   Wt 184 lb (83.5 kg)   BMI 28.82 kg/m  , BMI Body mass index is 28.82 kg/m. GEN: Well nourished, well developed, in no acute distress.  HEENT: normal.  Neck: Supple, no JVD, carotid bruits, or masses. Cardiac: RRR, no murmurs, rubs, or gallops. No clubbing, cyanosis, edema.  Radials/DP/PT 2+ and equal bilaterally.  Respiratory:  Respirations regular and  unlabored, clear to auscultation bilaterally. GI: Soft, nontender, nondistended, BS + x 4. MS: no deformity or atrophy. Skin: warm and dry, no rash. Neuro:  Strength and sensation are  intact. Psych: Normal affect.  Accessory Clinical Findings    ECG -regular sinus rhythm, 64, left axis deviation, LVH, no acute ST or T changes.  Assessment & Plan    1.  Paroxysmal atrial fibrillation: This was first diagnosed in May and she has been anticoagulated with Eliquis since.  She is also on low-dose metoprolol and diltiazem.  She wore an event monitor in May to assess for symptomatic A. fib as she had been experiencing intermittent dizziness, fatigue, chest pressure, and dyspnea.  A. fib accounted for 14% of total beats.  Most of her triggered events and diary entries were actually associated with sinus rhythm, though she did have 2 that were associated with atrial fibrillation.  She says that since wearing the monitor, she actually has been feeling better.  She is walking up to a mile a day and only experiences symptoms of chest pressure or dyspnea about once a week.  Symptoms resolve relatively quickly with rest.  She has not had any palpitations.  She is in sinus rhythm today.  We discussed options for management to potentially include titration of beta-blocker and/or calcium channel blocker, addition of antiarrhythmic therapy (amiodarone), or continued medical therapy.  As symptoms seem to have improved some and were not always correlated with A. fib, we will not pursue antiarrhythmic therapy at this time.  I had considered increasing her beta-blocker as her pressure is 145/82 however, she notes that blood pressure typically runs in the 120s at home and she continues to have orthostatic symptoms.  In that setting, I will continue current doses of both beta-blocker and calcium channel blocker.  She remains anticoagulated on Eliquis and I will follow-up a CBC and basic metabolic panel today as this has not  been checked since initiation.  She checks her blood pressure regularly at home I have asked her to continue to check blood pressure during symptomatic episodes,  As we may wish to initiate antiarrhythmic therapy if clearer link between heart rate and symptoms noted.  2.  Chest tightness: During monitoring, she had episodes during A. fib and multiple episodes during sinus rhythm.  Thus not clearly linked to A. fib.  Myoview in May was nonischemic and low risk.  No further ischemic evaluation at this time.  3.  Dyspnea on exertion: This seems to have improved as she is now walking up to a mile a day.  Echo showed normal LV function with grade 1 diastolic dysfunction in May.  She is euvolemic on exam.  As above, symptoms not clearly linked to A. fib.  She is eager to return to low impact aerobic-like exercises at her local senior center.  I have provided her with a note so that she may do this.  4.  Essential hypertension: Blood pressure elevated today however she notes it typically runs in the 120s and sometimes below 110.  She also has a history of orthostatic hypotension.  In that setting, I am not going to adjust her medications today.  5.  Disposition: Follow-up in 3 months or sooner if necessary.  Murray Hodgkins, NP 04/12/2018, 2:50 PM

## 2018-04-12 NOTE — Patient Instructions (Signed)
Labwork: Labs done today and we will call you with the results.   Follow-Up: Your physician recommends that you schedule a follow-up appointment in: 3 months with Dr. Rockey Situ.  It was a pleasure seeing you today here in the office. Please do not hesitate to give [place] a call back if you have any further questions. [place], BSN

## 2018-04-13 LAB — CBC WITH DIFFERENTIAL/PLATELET
BASOS ABS: 0.1 10*3/uL (ref 0.0–0.2)
Basos: 1 %
EOS (ABSOLUTE): 0.2 10*3/uL (ref 0.0–0.4)
Eos: 4 %
Hematocrit: 36.2 % (ref 34.0–46.6)
Hemoglobin: 11.9 g/dL (ref 11.1–15.9)
IMMATURE GRANULOCYTES: 0 %
Immature Grans (Abs): 0 10*3/uL (ref 0.0–0.1)
LYMPHS ABS: 1.8 10*3/uL (ref 0.7–3.1)
Lymphs: 32 %
MCH: 28.3 pg (ref 26.6–33.0)
MCHC: 32.9 g/dL (ref 31.5–35.7)
MCV: 86 fL (ref 79–97)
Monocytes Absolute: 0.5 10*3/uL (ref 0.1–0.9)
Monocytes: 9 %
NEUTROS PCT: 54 %
Neutrophils Absolute: 3.1 10*3/uL (ref 1.4–7.0)
PLATELETS: 245 10*3/uL (ref 150–450)
RBC: 4.2 x10E6/uL (ref 3.77–5.28)
RDW: 14.4 % (ref 12.3–15.4)
WBC: 5.7 10*3/uL (ref 3.4–10.8)

## 2018-04-13 LAB — BASIC METABOLIC PANEL WITH GFR
BUN/Creatinine Ratio: 15 (ref 12–28)
BUN: 13 mg/dL (ref 8–27)
CO2: 24 mmol/L (ref 20–29)
Calcium: 9.1 mg/dL (ref 8.7–10.3)
Chloride: 99 mmol/L (ref 96–106)
Creatinine, Ser: 0.88 mg/dL (ref 0.57–1.00)
GFR calc Af Amer: 68 mL/min/1.73 (ref >59)
GFR calc non Af Amer: 59 mL/min/1.73 — ABNORMAL LOW (ref >59)
Glucose: 83 mg/dL (ref 65–99)
Potassium: 4.5 mmol/L (ref 3.5–5.2)
Sodium: 139 mmol/L (ref 134–144)

## 2018-04-20 ENCOUNTER — Other Ambulatory Visit: Payer: Self-pay | Admitting: Gastroenterology

## 2018-04-20 DIAGNOSIS — K219 Gastro-esophageal reflux disease without esophagitis: Secondary | ICD-10-CM

## 2018-05-06 ENCOUNTER — Other Ambulatory Visit: Payer: Self-pay | Admitting: Gastroenterology

## 2018-05-06 ENCOUNTER — Other Ambulatory Visit: Payer: Self-pay | Admitting: Internal Medicine

## 2018-05-06 DIAGNOSIS — M810 Age-related osteoporosis without current pathological fracture: Secondary | ICD-10-CM

## 2018-05-06 NOTE — Telephone Encounter (Signed)
PT left vm to get refill on rx [facility]int 30 [facility]

## 2018-05-07 ENCOUNTER — Other Ambulatory Visit: Payer: Self-pay

## 2018-05-07 DIAGNOSIS — K219 Gastro-esophageal reflux disease without esophagitis: Secondary | ICD-10-CM

## 2018-05-07 MED ORDER — DEXLANSOPRAZOLE 30 MG PO CPDR: 1.0000 | DELAYED_RELEASE_CAPSULE | Freq: Every day | ORAL | 2 refills | Status: DC

## 2018-05-07 NOTE — Telephone Encounter (Signed)
Rx for Dexilant has been sent to pt's pharmacy per her request.

## 2018-05-09 DIAGNOSIS — M47896 Other spondylosis, lumbar region: Secondary | ICD-10-CM | POA: Diagnosis not present

## 2018-05-09 DIAGNOSIS — M48061 Spinal stenosis, lumbar region without neurogenic claudication: Secondary | ICD-10-CM | POA: Diagnosis not present

## 2018-05-09 DIAGNOSIS — M25551 Pain in right hip: Secondary | ICD-10-CM | POA: Diagnosis not present

## 2018-05-09 DIAGNOSIS — M5416 Radiculopathy, lumbar region: Secondary | ICD-10-CM | POA: Diagnosis not present

## 2018-05-09 DIAGNOSIS — M7918 Myalgia, other site: Secondary | ICD-10-CM | POA: Diagnosis not present

## 2018-05-10 ENCOUNTER — Telehealth: Payer: Self-pay | Admitting: Cardiovascular Disease

## 2018-05-10 ENCOUNTER — Encounter: Payer: Self-pay | Admitting: Internal Medicine

## 2018-05-10 ENCOUNTER — Other Ambulatory Visit: Payer: Self-pay | Admitting: *Deleted

## 2018-05-10 ENCOUNTER — Ambulatory Visit (INDEPENDENT_AMBULATORY_CARE_PROVIDER_SITE_OTHER): Payer: Medicare Other | Admitting: Internal Medicine

## 2018-05-10 DIAGNOSIS — J439 Emphysema, unspecified: Secondary | ICD-10-CM | POA: Diagnosis not present

## 2018-05-10 MED ORDER — ALBUTEROL SULFATE HFA 108 (90 BASE) MCG/ACT IN AERS: 2.0000 | INHALATION_SPRAY | RESPIRATORY_TRACT | 1 refills | Status: DC | PRN

## 2018-05-10 MED ORDER — DILTIAZEM HCL ER COATED BEADS 120 MG PO CP24: 120.0000 mg | ORAL_CAPSULE | Freq: Every day | ORAL | 0 refills | Status: DC

## 2018-05-10 MED ORDER — FUROSEMIDE 20 MG PO TABS: 20.0000 mg | ORAL_TABLET | Freq: Every day | ORAL | 0 refills | Status: DC

## 2018-05-10 NOTE — Telephone Encounter (Signed)
Requested Prescriptions   Signed Prescriptions Disp Refills  . diltiazem (CARDIZEM CD) 120 MG 24 hr capsule 90 capsule 0    Sig: Take 1 capsule (120 mg total) by mouth daily.    Authorizing Provider: Minna Merritts    Ordering User: Eugenio Hoes, Marieta Markov C  . furosemide (LASIX) 20 MG tablet 90 tablet 0    Sig: Take 1 tablet (20 mg total) by mouth daily.    Authorizing Provider: Minna Merritts    Ordering User: Britt Bottom

## 2018-05-10 NOTE — Patient Instructions (Signed)
Use albuterol as needed 2-4 puffs every 4 hrs as needed Continue ADVAIR HFA as prescribed

## 2018-05-10 NOTE — Telephone Encounter (Signed)
Requested Prescriptions   Signed Prescriptions Disp Refills  . diltiazem (CARDIZEM CD) 120 MG 24 hr capsule 90 capsule 0    Sig: Take 1 capsule (120 mg total) by mouth daily.    Authorizing Provider: Minna Merritts    Ordering User: Eugenio Hoes, MARINA C  . furosemide (LASIX) 20 MG tablet 90 tablet 0    Sig: Take 1 tablet (20 mg total) by mouth daily.    Authorizing Provider: Minna Merritts    Ordering User: Britt Bottom

## 2018-05-10 NOTE — Telephone Encounter (Signed)
°*  STAT* If patient is at the pharmacy, call can be transferred to refill team.   1. Which medications need to be refilled? (please list name of each medication and dose if known)     Cartia 120 mg po q day     Furosemide 20 mg po q day   2. Which pharmacy/location (including street and city if local pharmacy) [facility]  3. Do they need a 30 day or 90 day supply?  [place]

## 2018-05-10 NOTE — Progress Notes (Signed)
Date: 05/10/2018  MRN# [id number] Julie Davies 01/12/1927  Referring Physician: NP Webb Silversmith  Julie Davies is a 84 y.o. old female seen in consultation for asthma and lung nodules  CC:  Follow up ASTHMA  PREVIOUS HISTORY Follow up Patient is a pleasant 84 year old female for evaluation of lung nodules and asthma optimization. Patient has a history of lung nodules. He follow-up pulmonologist in [place], review of records show that she was following his earliest 2014 for 2 small 8 mm lower lobe nodules with mild adenopathy. Nodules are nonspecific in a patient with a history of choking, hiatal hernia and possible aspiration. At that time surveillance was recommended. Patient stated that she had her last CT in March 2016 at all pulmonologist office. Patient is a prolonged history of asthma, smoked for about 10 years quit in the 1950s, had significant secondhand smoking exposure from appearance. For her asthma she is currently on as needed albuterol, and Flovent. Patient states she has a mild intermittent slightly productive cough at times, has not had to use albuterol rescue inhaler in a few months. However she did state that prior to walking she will take 2 puffs of albuterol. 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 Patient states she has mild dizziness today but this is a chronic problem, she lives with her daughter, she can walk up and down stairs with mild dyspnea. Review of records showed she had one asthma exacerbation in 2015 that was treated with antibiotics and steroids. As for her pulmonary nodules based on her CAT scan from 2016 patient states she has 2 new nodules for a total of 4 nodules. At today's visit there is no CD with CT scans to compare previous lung windows. Allergies: Symptoms controlled on Xyzal, Flonase and Pataday.  HPI No acute sob No wheezing No  cough Chronic SOB stable No signs of infection No signs of exacerbation    CT Chest 12/2016 with patient there is new RUL GGO, all other nodules are stable and no growth  CT chest 04/13/17 with patient the right upper lobe groundglass opacity has completely resolved and all the other subcentimeter nodules have been stable since 2016   Medication:    Current Outpatient Medications:  .  acetaminophen (TYLENOL) 500 MG tablet, Take 500 mg by mouth every 4 (four) hours as needed for mild pain. , Disp: , Rfl:  .  ADVAIR HFA 115-21 MCG/ACT inhaler, USE 2 INHALATIONS TWICE A DAY, Disp: 36 g, Rfl: 3 .  albuterol (PROVENTIL HFA;VENTOLIN HFA) 108 (90 Base) MCG/ACT inhaler, Inhale 2 puffs into the lungs every 4 (four) hours as needed for wheezing or shortness of breath., Disp: 54 g, Rfl: 3 .  alendronate (FOSAMAX) 70 MG tablet, TAKE 1 TABLET EVERY 7 DAYS WITH A FULL GLASS OF WATER ON AN EMPTY STOMACH, Disp: 12 tablet, Rfl: 0 .  alum & mag hydroxide-simeth (MAALOX/MYLANTA) 200-200-20 MG/5ML suspension, Take 30 mLs by mouth every 4 (four) hours as needed for indigestion or heartburn., Disp: 355 mL, Rfl: 0 .  Alum Hydroxide-Mag Carbonate (GAVISCON EXTRA STRENGTH) 160-105 MG CHEW, Chew 2 tablets by mouth daily as needed (Acid reflux). , Disp: , Rfl:  .  apixaban (ELIQUIS) 5 MG TABS tablet, Take 1 tablet (5 mg total) by mouth 2 (two) times daily., Disp: 180 tablet, Rfl: 3 .  aspirin 81 MG tablet, Take 81 mg by mouth at bedtime. , Disp: , Rfl:  .  Cholecalciferol (VITAMIN D) 2000 units CAPS, Take 1 capsule by mouth daily., Disp: , Rfl:  .  Dexlansoprazole (DEXILANT) 30 MG capsule, Take 1 capsule (30 mg total) by mouth daily., Disp: 90 capsule, Rfl: 2 .  diltiazem (CARDIZEM CD) 120 MG 24 hr capsule, Take 1 capsule (120 mg total) by mouth daily., Disp: 90 capsule, Rfl: 0 .  docusate sodium (COLACE) 100 MG capsule, Take 100-200 mg by mouth 2 (two) times daily. Take 200 mg by mouth in the morning and 100 mg by  mouth at bedtime., Disp: , Rfl:  .  escitalopram (LEXAPRO) 5 MG tablet, Take 1 tablet (5 mg total) by mouth daily., Disp: 30 tablet, Rfl: 2 .  fluconazole (DIFLUCAN) 200 MG tablet, TAKE 1 TABLET BY MOUTH EVERY WEEK EVERY FRIDAY, Disp: , Rfl: 2 .  furosemide (LASIX) 20 MG tablet, Take 1 tablet (20 mg total) by mouth daily., Disp: 90 tablet, Rfl: 0 .  gabapentin (NEURONTIN) 300 MG capsule, Take 300 mg by mouth 2 (two) times daily. , Disp: , Rfl:  .  Ginger, Zingiber officinalis, (GINGER EXTRACT) 250 MG CAPS, Take 250 mg by mouth daily. , Disp: , Rfl:  .  Glucos-MSM-C-Mn-Ginger-Willow (GLUCOSAMINE MSM COMPLEX) TABS, Take 1 tablet by mouth 2 (two) times daily., Disp: , Rfl:  .  Ketotifen Fumarate ([employer] ALLERGY EYE DROPS OP), Place 1 drop into both eyes 2 (two) times daily as needed (allergies)., Disp: , Rfl:  .  levocetirizine (XYZAL) 5 MG tablet, Take 1 tablet (5 mg total) by mouth every evening., Disp: 30 tablet, Rfl: 0 .  metoprolol tartrate (LOPRESSOR) 25 MG tablet, Take 1 tablet (25 mg total) by mouth 2 (two) times daily., Disp: 180 tablet, Rfl: 3 .  Misc Natural Products (TART CHERRY ADVANCED) CAPS, Take 1 capsule by mouth 2 (two) times daily. , Disp: , Rfl:  .  Multiple Vitamins-Minerals (ONE DAILY MULTIVITAMIN WOMEN PO), Take 1 tablet by mouth daily., Disp: , Rfl:  .  polyethylene glycol (MIRALAX / GLYCOLAX) packet, Take 17 g by mouth daily as needed for mild constipation., Disp: 14 each, Rfl: 0 .  simvastatin (ZOCOR) 20 MG tablet, Take 1 tablet (20 mg total) by mouth daily., Disp: 90 tablet, Rfl: 3 .  TURMERIC PO, Take 300 mg by mouth at bedtime. , Disp: , Rfl:      Allergies:  Tramadol  Review of Systems: Gen:  Denies   sweats, chills HEENT: Denies blurred vision, double vision, ear pain, eye pain, hearing loss, nose bleeds, sore throat Cvc:  No dizziness, chest pain or heaviness Resp:   +cough, +mostly non productive, +shortness of breath Other:  All other systems negative  Physical  Examination:   BP (!) 142/88 (BP Location: Right Arm, Cuff Size: Normal)   Pulse 61   Ht 5\' 7"  (1.702 m)   Wt 188 lb (85.3 kg)   SpO2 96%   BMI 29.44 kg/m   General Appearance: No distress  Neuro:without focal findings, mental status, speech normal, alert and oriented, cranial nerves 2-12 intact, reflexes normal and symmetric, sensation grossly normal  HEENT: PERRLA, EOM intact, no ptosis, no other lesions noticed; Mallampati 2 Pulmonary: Diffuse bilateral wheezing. CardiovascularNormal S1,S2.  No m/r/g.  Abdominal aorta pulsation normal.    Extremities: Anterior right leg with small incision, clean mild erythema around the site but no significant drainage.   CT Chest 04/02/15 No findings to suggest interstitial lung disease, 8 x 10 mm microlobulated and slightly spiculated nodule in the posterior aspect of  the right upper lobe, concerning for potential primary bronchogenic neoplasm. Alternatively, given the patient's history of recent pneumonia, this could represent an area of post infectious or inflammatory scarring. There is an additional area of architectural distortion and slightly cephalad to this lesion within the right upper lobe which is also unusual, and favored to represent an area of scarring also. Mild diffuse bronchial wall thickening with mild centrilobular emphysema Three-vessel coronary artery disease  Pulmonary function testing 06/06/2013 FVC 98% FEV110% FEV1/FVC 79% RV 110 TLC 97 DLCO 73    CT chest 03/2017 1. Resolution of right upper lobe ground-glass nodule. 2. Stable numerous small bilateral pulmonary nodules since 2016. These are considered benign. 3. No new pulmonary lesions or acute pulmonary findings. 4. No mediastinal or hilar mass or lymphadenopathy. 5. Large hiatal hernia.   Assessment and Plan: 84 year old female with asthma and COPD stable at this time  #1  COPD/asthma-stable No acute resp issues Has good exercise tolerance and adapts to her  breathing Continue inhalers as prescribed   #2 With h/o  pulmonary nodules Pulmonary nodules-previous work up of nodules T scan of chest without contrast shows resolution of right upper lobe groundglass opacities but has persistent bilateral subcentimeter  pulmonary nodules that have been present since 2016 which are benign in nature No ct scans needed at this time   Patient/Family are satisfied with Plan of action and management. All questions answered Follow up in 6 months  Gracielynn Birkel Patricia Pesa, M.D.  [facility]  Medical Director [place] Director [facility] Cardio-Pulmonary Department

## 2018-05-24 DIAGNOSIS — M8589 Other specified disorders of bone density and structure, multiple sites: Secondary | ICD-10-CM | POA: Diagnosis not present

## 2018-05-24 DIAGNOSIS — M818 Other osteoporosis without current pathological fracture: Secondary | ICD-10-CM | POA: Diagnosis not present

## 2018-05-30 ENCOUNTER — Other Ambulatory Visit: Payer: Self-pay | Admitting: Cardiovascular Disease

## 2018-06-24 ENCOUNTER — Other Ambulatory Visit: Payer: Self-pay | Admitting: Internal Medicine

## 2018-07-11 ENCOUNTER — Other Ambulatory Visit: Payer: Self-pay | Admitting: Internal Medicine

## 2018-07-11 MED ORDER — FUROSEMIDE 20 MG PO TABS: 20.0000 mg | ORAL_TABLET | Freq: Every day | ORAL | 0 refills | Status: DC

## 2018-07-11 MED ORDER — DILTIAZEM HCL ER COATED BEADS 120 MG PO CP24
120.0000 mg | ORAL_CAPSULE | Freq: Every day | ORAL | 0 refills | Status: DC
Start: 2018-07-11 — End: 2018-07-15

## 2018-07-11 NOTE — Telephone Encounter (Signed)
[facility]em and [facility]

## 2018-07-12 ENCOUNTER — Ambulatory Visit: Payer: Medicare Other | Admitting: Cardiovascular Disease

## 2018-07-14 NOTE — Progress Notes (Signed)
Cardiology Office Note  Date:  07/15/2018   ID:  Brette Cast, DOB [birth year], MRN [id number]  PCP:  Jearld Fenton, NP   Chief Complaint  Patient presents with  . other    3 month follow up. Meds reviewed by the pt.'s daughter. Pt. c/o shortness of breath and dizzy at times.     HPI:  Julie Davies is a 84 y.o. female  Mild COPD/asthma PE on left  HTN PAF Prior history of orthostasis Significant coronary disease noticed on chest CT scan Presents for f/u of her chest pain, shortness of breath, PAF  Overall feels well, She reports having episodes of dizziness lasting less than 30 minutes at least once per week  Has to lay down,  Symptoms resolved without intervention Does not check her blood pressure or pulse rate during these episodes Daughter concerning it might be atrial fibrillation She denies any chest pain concerning for angina Walks with a cane  Recent testing reviewed with her in detail Stress test for chest pain 01/2018 Pharmacological myocardial perfusion imaging study with no significant ischemia Attenuation corrected images ( CT scan) not available secondary to technical issues Regions of fixed mildly decreased perfusion noted in the anteroseptal wall, apical inferior region, likely secondary to GI uptake artifact, breast attenuation artifact EF estimated at 34%, depressed ejection fraction secondary to GI uptake artifact No significant regional wall motion abnormality noted No EKG changes concerning for ischemia at peak stress or in recovery. Resting EKG with intraventricular conduction delay, APCs, which may be contributing to the mild fixed perfusion defects detailed above Low risk scan, clinical correlation suggested  Echo 01/2018, viewed today - Left ventricle: The cavity size was normal. There was mild focal   basal hypertrophy of the septum. Systolic function was normal.   The estimated ejection fraction was in the range of 60% to 65%.   Wall  motion was normal; there were no regional wall motion   abnormalities. Doppler parameters are consistent with abnormal   left ventricular relaxation (grade 1 diastolic dysfunction). - Mitral valve: Calcified annulus. There was mild regurgitation. - Left atrium: The atrium was moderately dilated. - Right ventricle: The cavity size was normal. Wall thickness was   normal. Systolic function was normal. - Pulmonary arteries: Systolic pressure was mildly to moderately   increased, in the range of 40 mm Hg to 45 mm Hg. - Inferior vena cava: The vessel was mildly dilated. The   respirophasic diameter changes were in the normal range (>= 50%),   consistent with mildly elevated central venous pressure.  Previous hospital records reviewed with her today May 2019 admitted to [facility] regional with acute exacerbation of COPD and also new onset of A. fib with RVR.    Echocardiogram  normal LV function with grade 1 diastolic dysfunction and moderately elevated pulmonary pressures-40 to 45 mmHg.  placed on beta-blocker, calcium channel blocker,  Eliquis She is still on aspirin   recurrent PAF.    late May generalized malaise, fatigue, lethargy, and intermittent dizziness.   intermittent palpitations and chest pressure.   ZIO monitor which showed 14% A. fib burden.    longest spell lasted 14 hours and 8 minutes with an average rate of 94 bpm.    EKG personally reviewed by myself on todays visit Shows normal sinus rhythm with rate 68 bpm left axis  on8/22/18 presented by EMS for evaluation of near syncope, dizziness, shortness of breath, and some centralized chest pressure.    symptoms in the past,  particularly the near syncope and dizziness, and has had extensive evaluations and been generally unremarkable.   Participates at the senior center.   Recently episode of chest pain while she was at lunch at [facility]   epigastric burning discomfort similar to prior heartburn and was having some  burping.  She felt very hot and flushed and was sweating and felt some "tightness" in her arms and legs.  little bit lightheaded like she might pass out but it was mild at the time.  felt like she was not going to be able to walk in the house safely and was assisted to the ground so that she did not fall down or pass out.    PMH:   has a past medical history of Allergy, Arthritis, Asthma, Basal cell carcinoma of skin, Chicken pox, Diastolic dysfunction, GERD (gastroesophageal reflux disease), Hiatal hernia, History of cardiovascular stress test, History of kidney stones, PAF (paroxysmal atrial fibrillation) ([place]), Postural dizziness with presyncope, Pulmonary embolism on left [facility]), and Pulmonary nodules.  PSH:    Past Surgical History:  Procedure Laterality Date  . APPENDECTOMY  1935  . DILATION AND CURETTAGE OF UTERUS  1975  . NEPHRECTOMY Right 1975  . REPLACEMENT TOTAL KNEE BILATERAL  2013  . VEIN LIGATION AND STRIPPING Left 1971    Current Outpatient Medications  Medication Sig Dispense Refill  . acetaminophen (TYLENOL) 500 MG tablet Take 500 mg by mouth every 4 (four) hours as needed for mild pain.     Marland Kitchen ADVAIR HFA 115-21 MCG/ACT inhaler USE 2 INHALATIONS TWICE A DAY 36 g 3  . albuterol (PROVENTIL HFA;VENTOLIN HFA) 108 (90 Base) MCG/ACT inhaler Inhale 2 puffs into the lungs every 4 (four) hours as needed for wheezing or shortness of breath. 2 Inhaler 1  . alendronate (FOSAMAX) 70 MG tablet TAKE 1 TABLET EVERY 7 DAYS WITH A FULL GLASS OF WATER ON AN EMPTY STOMACH 12 tablet 0  . alum & mag hydroxide-simeth (MAALOX/MYLANTA) 200-200-20 MG/5ML suspension Take 30 mLs by mouth every 4 (four) hours as needed for indigestion or heartburn. 355 mL 0  . Alum Hydroxide-Mag Carbonate (GAVISCON EXTRA STRENGTH) 160-105 MG CHEW Chew 2 tablets by mouth daily as needed (Acid reflux).     Marland Kitchen apixaban (ELIQUIS) 5 MG TABS tablet Take 1 tablet (5 mg total) by mouth 2 (two) times daily. 180 tablet 3  .  aspirin 81 MG tablet Take 81 mg by mouth at bedtime.     . cetirizine (ZYRTEC) 10 MG tablet Take 10 mg by mouth daily.    . Cholecalciferol (VITAMIN D) 2000 units CAPS Take 1 capsule by mouth daily.    Marland Kitchen Dexlansoprazole (DEXILANT) 30 MG capsule Take 1 capsule (30 mg total) by mouth daily. 90 capsule 2  . diltiazem (CARDIZEM CD) 120 MG 24 hr capsule Take 1 capsule (120 mg total) by mouth daily. 90 capsule 0  . docusate sodium (COLACE) 100 MG capsule Take 100-200 mg by mouth 2 (two) times daily. Take 200 mg by mouth in the morning and 100 mg by mouth at bedtime.    Marland Kitchen escitalopram (LEXAPRO) 5 MG tablet Take 1 tablet (5 mg total) by mouth daily. Pt needs to schedule annual physical 30 tablet 0  . fluconazole (DIFLUCAN) 200 MG tablet TAKE 1 TABLET BY MOUTH EVERY WEEK EVERY FRIDAY  2  . furosemide (LASIX) 20 MG tablet Take 1 tablet (20 mg total) by mouth daily. 90 tablet 0  . gabapentin (NEURONTIN) 300 MG capsule Take 300 mg  by mouth 2 (two) times daily.     . Ginger, Zingiber officinalis, (GINGER EXTRACT) 250 MG CAPS Take 250 mg by mouth daily.     Donnie Aho (GLUCOSAMINE MSM COMPLEX) TABS Take 1 tablet by mouth 2 (two) times daily.    Marland Kitchen Ketotifen Fumarate ([employer] ALLERGY EYE DROPS OP) Place 1 drop into both eyes 2 (two) times daily as needed (allergies).    . metoprolol tartrate (LOPRESSOR) 25 MG tablet Take 1 tablet (25 mg total) by mouth 2 (two) times daily. 180 tablet 3  . Misc Natural Products (TART CHERRY ADVANCED) CAPS Take 1 capsule by mouth 2 (two) times daily.     . Multiple Vitamins-Minerals (ONE DAILY MULTIVITAMIN WOMEN PO) Take 1 tablet by mouth daily.    . polyethylene glycol (MIRALAX / GLYCOLAX) packet Take 17 g by mouth daily as needed for mild constipation. 14 each 0  . simvastatin (ZOCOR) 20 MG tablet TAKE 1 TABLET DAILY 90 tablet 0  . TURMERIC PO Take 300 mg by mouth at bedtime.      No current facility-administered medications for this visit.      Allergies:    Tramadol   Social History:  The patient  reports that she has never smoked. She has never used smokeless tobacco. She reports that she drinks alcohol. She reports that she does not use drugs.   Family History:   family history includes Heart disease in her father and maternal aunt; Stroke in her maternal uncle.    Review of Systems: Review of Systems  Constitutional: Negative.   Respiratory: Negative.   Cardiovascular: Negative.   Musculoskeletal: Negative.   Neurological: Positive for dizziness.  Psychiatric/Behavioral: Negative.   All other systems reviewed and are negative.    PHYSICAL EXAM: VS:  BP 120/70 (BP Location: Left Arm, Patient Position: Sitting, Cuff Size: Normal)   Pulse 68   Ht 5\' 7"  (1.702 m)   Wt 188 lb (85.3 kg)   BMI 29.44 kg/m  , BMI Body mass index is 29.44 kg/m. Constitutional:  oriented to person, place, and time. No distress.  HENT:  Head: Normocephalic and atraumatic.  Eyes:  no discharge. No scleral icterus.  Neck: Normal range of motion. Neck supple. No JVD present.  Cardiovascular: Normal rate, regular rhythm, normal heart sounds and intact distal pulses. Exam reveals no gallop and no friction rub. No edema No murmur heard. Pulmonary/Chest: Effort normal and breath sounds normal. No stridor. No respiratory distress.  no wheezes.  no rales.  no tenderness.  Abdominal: Soft.  no distension.  no tenderness.  Musculoskeletal: Normal range of motion.  no  tenderness or deformity.  Neurological:  normal muscle tone. Coordination normal. No atrophy Skin: Skin is warm and dry. No rash noted. not diaphoretic.  Psychiatric:  normal mood and affect. behavior is normal. Thought content normal.    Recent Labs: 01/28/2018: ALT 31 01/31/2018: Magnesium 2.1; TSH 2.561 04/12/2018: BUN 13; Creatinine, Ser 0.88; Hemoglobin 11.9; Platelets 245; Potassium 4.5; Sodium 139    Lipid Panel Lab Results  Component Value Date   CHOL 145 04/17/2017   HDL 58.00  04/17/2017   LDLCALC 68 04/17/2017   TRIG 96.0 04/17/2017      Wt Readings from Last 3 Encounters:  07/15/18 188 lb (85.3 kg)  05/10/18 188 lb (85.3 kg)  04/12/18 184 lb (83.5 kg)       ASSESSMENT AND PLAN:  Essential hypertension -  Blood pressure stable but she does have prior history of orthostasis  and is having dizziness at home She will monitor blood pressure when she has dizzy episodes The seem to resolve by laying down  Mixed hyperlipidemia -  Continue simvastatin 20 mg daily  Gastroesophageal reflux disease without esophagitis - Managed with PPI, H2 blocker Dexilant expensive for her  Centrilobular emphysema (HCC) - Followed by pulmonary, on inhalers Denies any significant shortness of breath on exertion  Chest pain, unspecified type Low risk stress test, no significant ischemia normal echocardiogram ejection fraction greater than 55% We will perform cardiac catheterization for any symptoms concerning for angina  Coronary artery disease of native artery of native heart with stable angina pectoris ([place]) Significant coronary disease noticed on chest CT scan Denies chest pain concerning for angina Stress test low risk Would perform cardiac catheterization for any anginal symptoms  Disposition:   F/U  6 months   Total encounter time more than 25 minutes  Greater than 50% was spent in counseling and coordination of care with the patient    Orders Placed This Encounter  Procedures  . EKG 12-Lead     Signed, Esmond Plants, M.D., Ph.D. 07/15/2018  [place], [place]

## 2018-07-15 ENCOUNTER — Ambulatory Visit (INDEPENDENT_AMBULATORY_CARE_PROVIDER_SITE_OTHER): Payer: Medicare Other | Admitting: Cardiovascular Disease

## 2018-07-15 ENCOUNTER — Encounter: Payer: Self-pay | Admitting: Cardiovascular Disease

## 2018-07-15 VITALS — BP 120/70 | HR 68 | Ht 67.0 in | Wt 188.0 lb

## 2018-07-15 DIAGNOSIS — I272 Pulmonary hypertension, unspecified: Secondary | ICD-10-CM | POA: Diagnosis not present

## 2018-07-15 DIAGNOSIS — I480 Paroxysmal atrial fibrillation: Secondary | ICD-10-CM | POA: Diagnosis not present

## 2018-07-15 DIAGNOSIS — R42 Dizziness and giddiness: Secondary | ICD-10-CM | POA: Diagnosis not present

## 2018-07-15 DIAGNOSIS — I2510 Atherosclerotic heart disease of native coronary artery without angina pectoris: Secondary | ICD-10-CM | POA: Diagnosis not present

## 2018-07-15 DIAGNOSIS — I5032 Chronic diastolic (congestive) heart failure: Secondary | ICD-10-CM | POA: Diagnosis not present

## 2018-07-15 DIAGNOSIS — R0602 Shortness of breath: Secondary | ICD-10-CM | POA: Diagnosis not present

## 2018-07-15 DIAGNOSIS — J449 Chronic obstructive pulmonary disease, unspecified: Secondary | ICD-10-CM

## 2018-07-15 DIAGNOSIS — I10 Essential (primary) hypertension: Secondary | ICD-10-CM | POA: Diagnosis not present

## 2018-07-15 MED ORDER — METOPROLOL TARTRATE 25 MG PO TABS: 25.0000 mg | ORAL_TABLET | Freq: Two times a day (BID) | ORAL | 3 refills | Status: DC

## 2018-07-15 MED ORDER — FUROSEMIDE 20 MG PO TABS: 20.0000 mg | ORAL_TABLET | Freq: Every day | ORAL | 3 refills | Status: DC

## 2018-07-15 MED ORDER — SIMVASTATIN 20 MG PO TABS: 20.0000 mg | ORAL_TABLET | Freq: Every day | ORAL | 3 refills | Status: DC

## 2018-07-15 MED ORDER — DILTIAZEM HCL ER COATED BEADS 120 MG PO CP24: 120.0000 mg | ORAL_CAPSULE | Freq: Every day | ORAL | 3 refills | Status: DC

## 2018-07-15 MED ORDER — APIXABAN 5 MG PO TABS: 5.0000 mg | ORAL_TABLET | Freq: Two times a day (BID) | ORAL | 3 refills | Status: DC

## 2018-07-15 NOTE — Addendum Note (Signed)
Addended by: Valora Corporal on: 07/15/2018 10:15 AM   Modules accepted: Orders

## 2018-07-15 NOTE — Patient Instructions (Addendum)
Please monitor these episodes: Heart rate and blood pressure If fast or irregular pulse, call the office, this could be atrial fib Watch for low blood pressure  Medication Instructions:   Stop the aspirin  Labwork:  No new labs needed  Testing/Procedures:  No further testing at this time   Follow-Up: It was a pleasure seeing you in the office today. Please call us if you have new issues that need to be addressed before your next appt.  [phone]  Your physician wants you to follow-up in: 6 months.  You will receive a reminder letter in the mail two months in advance. If you don't receive a letter, please call our office to schedule the follow-up appointment.  If you need a refill on your cardiac medications before your next appointment, please call your pharmacy.  For educational health videos Log in to : [email or web address] Or : [email or web address], password : [email or web address]

## 2018-07-30 ENCOUNTER — Other Ambulatory Visit: Payer: Self-pay | Admitting: Internal Medicine

## 2018-07-30 DIAGNOSIS — M810 Age-related osteoporosis without current pathological fracture: Secondary | ICD-10-CM

## 2018-08-01 ENCOUNTER — Ambulatory Visit (INDEPENDENT_AMBULATORY_CARE_PROVIDER_SITE_OTHER): Payer: Medicare Other | Admitting: Internal Medicine

## 2018-08-01 ENCOUNTER — Encounter: Payer: Self-pay | Admitting: Internal Medicine

## 2018-08-01 VITALS — BP 122/68 | HR 62 | Temp 97.7°F | Wt 192.0 lb

## 2018-08-01 DIAGNOSIS — Z23 Encounter for immunization: Secondary | ICD-10-CM

## 2018-08-01 DIAGNOSIS — I2510 Atherosclerotic heart disease of native coronary artery without angina pectoris: Secondary | ICD-10-CM

## 2018-08-01 DIAGNOSIS — J302 Other seasonal allergic rhinitis: Secondary | ICD-10-CM | POA: Diagnosis not present

## 2018-08-01 NOTE — Patient Instructions (Signed)

## 2018-08-06 ENCOUNTER — Encounter: Payer: Self-pay | Admitting: Internal Medicine

## 2018-08-06 NOTE — Progress Notes (Signed)
HPI  Pt presents to the clinic today with c/o itchy, watery eyes, runny nose and scratchy throat. She reports this has been an ongoing issue. She denies eye pain, eye redness or purulent discharge. She denies difficulty swallowing. She has a history of allergies and asthma. She denies fever, chills or body aches. She has not taken anything OTC for her symptoms.  Review of Systems      Past Medical History:  Diagnosis Date  . Allergy   . Arthritis   . Asthma   . Basal cell carcinoma of skin   . Chicken pox   . Diastolic dysfunction    a. TTE 5/19: EF 60-65%, mild focal basal hypertrophy, no RWMA, Gr1DD, mod dilated LA, RV cavity size nl with nl RVSF, PASP 40-45, mildly dilated IVC  . GERD (gastroesophageal reflux disease)   . Hiatal hernia    a. noted on Chest CT 03/2017.  Marland Kitchen History of cardiovascular stress test    a. 01/2018 MV: No ischemia. Fixed antsept, apical inferior defects - likely 2/2 GI uptake artifact (attenuation corrected images not available). Low risk.  Marland Kitchen History of kidney stones   . PAF (paroxysmal atrial fibrillation) ([place])    a. diagnosed 5/19; b. CHADS2VASc = 4 (age x 2, vascular disease - noted on CT chest 7/18, female)--> Eliquis; c. 01/2018 Zio Monitor: 14% Afib.  Marland Kitchen Postural dizziness with presyncope    a. TTE 8/18: EF 55-60%, nl WM, Gr1DD, mild MR, mildly dilated LA  . Pulmonary embolism on left [facility])    a. in the setting of right TKA; b. treated with Coumadin  . Pulmonary nodules    a. 03/2017 Chest CT: resolution of RUL ground glass nodule. Stable, numerous small bilat pulm nodules since 2016-->benign.    Family History  Problem Relation Age of Onset  . Heart disease Father   . Stroke Maternal Uncle   . Heart disease Maternal Aunt   . Cancer Neg Hx   . Diabetes Neg Hx     Social History   Socioeconomic History  . Marital status: Widowed    Spouse name: Not on file  . Number of children: Not on file  . Years of education: Not on file  . Highest  education level: Not on file  Occupational History  . Not on file  Social Needs  . Financial resource strain: Not on file  . Food insecurity:    Worry: Not on file    Inability: Not on file  . Transportation needs:    Medical: Not on file    Non-medical: Not on file  Tobacco Use  . Smoking status: Never Smoker  . Smokeless tobacco: Never Used  Substance and Sexual Activity  . Alcohol use: Yes    Alcohol/week: 0.0 standard drinks    Comment: rare--wine  . Drug use: No  . Sexual activity: Not Currently  Lifestyle  . Physical activity:    Days per week: Not on file    Minutes per session: Not on file  . Stress: Not on file  Relationships  . Social connections:    Talks on phone: Not on file    Gets together: Not on file    Attends religious service: Not on file    Active member of club or organization: Not on file    Attends meetings of clubs or organizations: Not on file    Relationship status: Not on file  . Intimate partner violence:    Fear of current or ex  partner: Not on file    Emotionally abused: Not on file    Physically abused: Not on file    Forced sexual activity: Not on file  Other Topics Concern  . Not on file  Social History Narrative  . Not on file    Allergies  Allergen Reactions  . Tramadol Other (See Comments)    Pt states she had a syncopal episode and confusion with Elev BP.      Constitutional: Denies headache, fatigue, fever or abrupt weight changes.  HEENT:  Positive itchy, watery eyes, runny nose, scratchy throat. Denies eye redness, eye pain, pressure behind the eyes, facial pain, nasal congestion, ear pain, ringing in the ears, wax buildup, runny nose or bloody nose. Respiratory:  Denies cough, difficulty breathing or shortness of breath.  Cardiovascular: Denies chest pain, chest tightness, palpitations or swelling in the hands or feet.   No other specific complaints in a complete review of systems (except as listed in HPI  above).  Objective:   BP 122/68   Pulse 62   Temp 97.7 F (36.5 C) (Oral)   Wt 192 lb (87.1 kg)   SpO2 96%   BMI 30.07 kg/m  Wt Readings from Last 3 Encounters:  08/01/18 192 lb (87.1 kg)  07/15/18 188 lb (85.3 kg)  05/10/18 188 lb (85.3 kg)     General: Appears her stated age, in NAD. HEENT: Head: normal shape and size, no sinus tenderness noted; Eyes: sclera white, no icterus, conjunctiva pink; Ears: Tm's gray and intact, normal light reflex; Nose: mucosa pink and moist, septum midline; Throat/Mouth: + PND. Teeth present, mucosa pink and moist, no exudate noted, no lesions or ulcerations noted.  Neck: No cervical lymphadenopathy.  Pulmonary/Chest: Normal effort and positive vesicular breath sounds. No respiratory distress. No wheezes, rales or ronchi noted.       Assessment & Plan:   Allergic Rhinitis:  Continue Zyrtec in pm Add Allegra and Flonase in am  RTC as needed or if symptoms persist.   Webb Silversmith, NP

## 2018-08-07 ENCOUNTER — Other Ambulatory Visit: Payer: Self-pay

## 2018-08-07 MED ORDER — ESCITALOPRAM OXALATE 5 MG PO TABS: 5.0000 mg | ORAL_TABLET | Freq: Every day | ORAL | 0 refills | Status: DC

## 2018-08-07 NOTE — Telephone Encounter (Signed)
sent 

## 2018-08-07 NOTE — Telephone Encounter (Signed)
Pt was seen 08/01/18 for acute visit but pt said she talked with Avie Echevaria NP about getting CPX extended since pt is not driving now and her daughter travels with her work. Pt will speak with daughter soon about scheduling CPX. Pt requesting [facility] day [facility] not allowing meds routinely taken be sent to local pharmacy. Pt has 3 tabs of lexapro left. Pt request cb after Avie Echevaria NP reviews[facility]

## 2018-08-07 NOTE — Telephone Encounter (Signed)
Pt notified med was sent to mail order pharmacy; pt voiced understanding, appreciative and will cb after talks with daughter to schedule. CPX.

## 2018-08-14 ENCOUNTER — Other Ambulatory Visit: Payer: Self-pay

## 2018-09-03 DIAGNOSIS — D1801 Hemangioma of skin and subcutaneous tissue: Secondary | ICD-10-CM | POA: Diagnosis not present

## 2018-09-03 DIAGNOSIS — L570 Actinic keratosis: Secondary | ICD-10-CM | POA: Diagnosis not present

## 2018-09-03 DIAGNOSIS — C44311 Basal cell carcinoma of skin of nose: Secondary | ICD-10-CM | POA: Diagnosis not present

## 2018-09-03 DIAGNOSIS — X32XXXA Exposure to sunlight, initial encounter: Secondary | ICD-10-CM | POA: Diagnosis not present

## 2018-09-03 DIAGNOSIS — L821 Other seborrheic keratosis: Secondary | ICD-10-CM | POA: Diagnosis not present

## 2018-09-03 DIAGNOSIS — B351 Tinea unguium: Secondary | ICD-10-CM | POA: Diagnosis not present

## 2018-09-03 DIAGNOSIS — Z85828 Personal history of other malignant neoplasm of skin: Secondary | ICD-10-CM | POA: Diagnosis not present

## 2018-09-03 DIAGNOSIS — Z08 Encounter for follow-up examination after completed treatment for malignant neoplasm: Secondary | ICD-10-CM | POA: Diagnosis not present

## 2018-09-03 DIAGNOSIS — D485 Neoplasm of uncertain behavior of skin: Secondary | ICD-10-CM | POA: Diagnosis not present

## 2018-09-04 DIAGNOSIS — M5416 Radiculopathy, lumbar region: Secondary | ICD-10-CM | POA: Diagnosis not present

## 2018-10-07 DIAGNOSIS — M5416 Radiculopathy, lumbar region: Secondary | ICD-10-CM | POA: Diagnosis not present

## 2018-10-09 DIAGNOSIS — M5416 Radiculopathy, lumbar region: Secondary | ICD-10-CM | POA: Diagnosis not present

## 2018-10-16 ENCOUNTER — Ambulatory Visit (INDEPENDENT_AMBULATORY_CARE_PROVIDER_SITE_OTHER): Payer: Medicare Other | Admitting: Internal Medicine

## 2018-10-16 ENCOUNTER — Encounter: Payer: Self-pay | Admitting: Internal Medicine

## 2018-10-16 VITALS — BP 126/74 | HR 76 | Temp 97.6°F | Ht 66.0 in | Wt 189.0 lb

## 2018-10-16 DIAGNOSIS — I10 Essential (primary) hypertension: Secondary | ICD-10-CM | POA: Diagnosis not present

## 2018-10-16 DIAGNOSIS — J4520 Mild intermittent asthma, uncomplicated: Secondary | ICD-10-CM

## 2018-10-16 DIAGNOSIS — I5032 Chronic diastolic (congestive) heart failure: Secondary | ICD-10-CM | POA: Diagnosis not present

## 2018-10-16 DIAGNOSIS — I480 Paroxysmal atrial fibrillation: Secondary | ICD-10-CM | POA: Diagnosis not present

## 2018-10-16 DIAGNOSIS — K219 Gastro-esophageal reflux disease without esophagitis: Secondary | ICD-10-CM | POA: Diagnosis not present

## 2018-10-16 DIAGNOSIS — Z0000 Encounter for general adult medical examination without abnormal findings: Secondary | ICD-10-CM | POA: Diagnosis not present

## 2018-10-16 DIAGNOSIS — E782 Mixed hyperlipidemia: Secondary | ICD-10-CM | POA: Diagnosis not present

## 2018-10-16 DIAGNOSIS — I2510 Atherosclerotic heart disease of native coronary artery without angina pectoris: Secondary | ICD-10-CM

## 2018-10-16 DIAGNOSIS — M1990 Unspecified osteoarthritis, unspecified site: Secondary | ICD-10-CM | POA: Diagnosis not present

## 2018-10-16 DIAGNOSIS — F39 Unspecified mood [affective] disorder: Secondary | ICD-10-CM | POA: Diagnosis not present

## 2018-10-16 DIAGNOSIS — M810 Age-related osteoporosis without current pathological fracture: Secondary | ICD-10-CM | POA: Diagnosis not present

## 2018-10-16 NOTE — Patient Instructions (Signed)
Health Maintenance After Age 83 After age 83, you are at a higher risk for certain long-term diseases and infections as well as injuries from falls. Falls are a major cause of broken bones and head injuries in people who are older than age 83. Getting regular preventive care can help to keep you healthy and well. Preventive care includes getting regular testing and making lifestyle changes as recommended by your health care provider. Talk with your health care provider about:  Which screenings and tests you should have. A screening is a test that checks for a disease when you have no symptoms.  A diet and exercise plan that is right for you. What should I know about screenings and tests to prevent falls? Screening and testing are the best ways to find a health problem early. Early diagnosis and treatment give you the best chance of managing medical conditions that are common after age 83. Certain conditions and lifestyle choices may make you more likely to have a fall. Your health care provider may recommend:  Regular vision checks. Poor vision and conditions such as cataracts can make you more likely to have a fall. If you wear glasses, make sure to get your prescription updated if your vision changes.  Medicine review. Work with your health care provider to regularly review all of the medicines you are taking, including over-the-counter medicines. Ask your health care provider about any side effects that may make you more likely to have a fall. Tell your health care provider if any medicines that you take make you feel dizzy or sleepy.  Osteoporosis screening. Osteoporosis is a condition that causes the bones to get weaker. This can make the bones weak and cause them to break more easily.  Blood pressure screening. Blood pressure changes and medicines to control blood pressure can make you feel dizzy.  Strength and balance checks. Your health care provider may recommend certain tests to check your  strength and balance while standing, walking, or changing positions.  Foot health exam. Foot pain and numbness, as well as not wearing proper footwear, can make you more likely to have a fall.  Depression screening. You may be more likely to have a fall if you have a fear of falling, feel emotionally low, or feel unable to do activities that you used to do.  Alcohol use screening. Using too much alcohol can affect your balance and may make you more likely to have a fall. What actions can I take to lower my risk of falls? General instructions  Talk with your health care provider about your risks for falling. Tell your health care provider if: ? You fall. Be sure to tell your health care provider about all falls, even ones that seem minor. ? You feel dizzy, sleepy, or off-balance.  Take over-the-counter and prescription medicines only as told by your health care provider. These include any supplements.  Eat a healthy diet and maintain a healthy weight. A healthy diet includes low-fat dairy products, low-fat (lean) meats, and fiber from whole grains, beans, and lots of fruits and vegetables. Home safety  Remove any tripping hazards, such as rugs, cords, and clutter.  Install safety equipment such as grab bars in bathrooms and safety rails on stairs.  Keep rooms and walkways well-lit. Activity   Follow a regular exercise program to stay fit. This will help you maintain your balance. Ask your health care provider what types of exercise are appropriate for you.  If you need a cane or   walker, use it as recommended by your health care provider.  Wear supportive shoes that have nonskid soles. Lifestyle  Do not drink alcohol if your health care provider tells you not to drink.  If you drink alcohol, limit how much you have: ? 0-1 drink a day for women. ? 0-2 drinks a day for men.  Be aware of how much alcohol is in your drink. In the [place] one drink equals one typical bottle of beer (12  oz), one-half glass of wine (5 oz), or one shot of hard liquor (1 oz).  Do not use any products that contain nicotine or tobacco, such as cigarettes and e-cigarettes. If you need help quitting, ask your health care provider. Summary  Having a healthy lifestyle and getting preventive care can help to protect your health and wellness after age 83.  Screening and testing are the best way to find a health problem early and help you avoid having a fall. Early diagnosis and treatment give you the best chance for managing medical conditions that are more common for people who are older than age 83.  Falls are a major cause of broken bones and head injuries in people who are older than age 83. Take precautions to prevent a fall at home.  Work with your health care provider to learn what changes you can make to improve your health and wellness and to prevent falls. This information is not intended to replace advice given to you by your health care provider. Make sure you discuss any questions you have with your health care provider. Document Released: 07/25/2017 Document Revised: 07/25/2017 Document Reviewed: 07/25/2017 Elsevier Interactive Patient Education  2019 [employer]  

## 2018-10-16 NOTE — Progress Notes (Signed)
HPI:  Pt presents to the clinic today for her Medicare Wellness Exam. She is also due to follow up chronic conditions.  Arthritis: Mainly in her hands, wrist and back. She takes Tylenol and Gabapentin with good relief.   Asthma: Mild, persistent. She use Advair daily with good control of symptoms. Spirometry from 05/2013 reviewed. She follows with Dr. Mortimer Fries.  GERD: Controlled on Dexilant. She reports her insurance will no longer pay for Dexilant and she is requesting an alternative. She denies breakthrough symptoms. There is no upper GI on file.  HTN: Her BP today is 126/74. Controlled on Diltiazem, Metoprolol and Lasix. ECG from 06/2018 reviewed.  HLD with CAD: No angina. Her last LDL was 68, 03/2017. She denies myalgias on Simvastatin. She consumes a low fat diet.  Osteoporosis: Her last BMD was 03/2016. She is taking Fosamax as prescribed. She denies recent falls.  Afib: Paroxysmal. She is taking Cardizem, Metoprolol and Eliquis as prescribed. She follows with Dr. Rockey Situ.  Mood Disorder: Mild anxiety. She is taking Lexapro as prescribed. She denies depression, SI/HI.  CHF, Diastolic: Compensated. She denies chronic cough but has some mild shortness of breath. She is taking Lasix as prescribed. Echo from 01/2018 reviewed. She follows with Dr. Rockey Situ.   Past Medical History:  Diagnosis Date  . Allergy   . Arthritis   . Asthma   . Basal cell carcinoma of skin   . Chicken pox   . Diastolic dysfunction    a. TTE 5/19: EF 60-65%, mild focal basal hypertrophy, no RWMA, Gr1DD, mod dilated LA, RV cavity size nl with nl RVSF, PASP 40-45, mildly dilated IVC  . GERD (gastroesophageal reflux disease)   . Hiatal hernia    a. noted on Chest CT 03/2017.  Marland Kitchen History of cardiovascular stress test    a. 01/2018 MV: No ischemia. Fixed antsept, apical inferior defects - likely 2/2 GI uptake artifact (attenuation corrected images not available). Low risk.  Marland Kitchen History of kidney stones   . PAF (paroxysmal  atrial fibrillation) ([place])    a. diagnosed 5/19; b. CHADS2VASc = 4 (age x 2, vascular disease - noted on CT chest 7/18, female)--> Eliquis; c. 01/2018 Zio Monitor: 14% Afib.  Marland Kitchen Postural dizziness with presyncope    a. TTE 8/18: EF 55-60%, nl WM, Gr1DD, mild MR, mildly dilated LA  . Pulmonary embolism on left [facility])    a. in the setting of right TKA; b. treated with Coumadin  . Pulmonary nodules    a. 03/2017 Chest CT: resolution of RUL ground glass nodule. Stable, numerous small bilat pulm nodules since 2016-->benign.    Current Outpatient Medications  Medication Sig Dispense Refill  . acetaminophen (TYLENOL) 500 MG tablet Take 500 mg by mouth every 4 (four) hours as needed for mild pain.     Marland Kitchen ADVAIR HFA 115-21 MCG/ACT inhaler USE 2 INHALATIONS TWICE A DAY 36 g 3  . albuterol (PROVENTIL HFA;VENTOLIN HFA) 108 (90 Base) MCG/ACT inhaler Inhale 2 puffs into the lungs every 4 (four) hours as needed for wheezing or shortness of breath. 2 Inhaler 1  . alendronate (FOSAMAX) 70 MG tablet TAKE 1 TABLET EVERY 7 DAYS WITH A FULL GLASS OF WATER ON AN EMPTY STOMACH 12 tablet 4  . alum & mag hydroxide-simeth (MAALOX/MYLANTA) 409-811-91 MG/5ML suspension Take 30 mLs by mouth every 4 (four) hours as needed for indigestion or heartburn. 355 mL 0  . Alum Hydroxide-Mag Carbonate (GAVISCON EXTRA STRENGTH) 160-105 MG CHEW Chew 2 tablets by mouth daily as  needed (Acid reflux).     Marland Kitchen apixaban (ELIQUIS) 5 MG TABS tablet Take 1 tablet (5 mg total) by mouth 2 (two) times daily. 180 tablet 3  . cetirizine (ZYRTEC) 10 MG tablet Take 10 mg by mouth daily.    . Cholecalciferol (VITAMIN D) 2000 units CAPS Take 1 capsule by mouth daily.    Marland Kitchen Dexlansoprazole (DEXILANT) 30 MG capsule Take 1 capsule (30 mg total) by mouth daily. 90 capsule 2  . diltiazem (CARDIZEM CD) 120 MG 24 hr capsule Take 1 capsule (120 mg total) by mouth daily. 90 capsule 3  . docusate sodium (COLACE) 100 MG capsule Take 100-200 mg by mouth 2 (two) times  daily. Take 200 mg by mouth in the morning and 100 mg by mouth at bedtime.    Marland Kitchen escitalopram (LEXAPRO) 5 MG tablet Take 1 tablet (5 mg total) by mouth daily. Pt needs to schedule annual physical 90 tablet 0  . fluconazole (DIFLUCAN) 200 MG tablet TAKE 1 TABLET BY MOUTH EVERY WEEK EVERY FRIDAY  2  . furosemide (LASIX) 20 MG tablet Take 1 tablet (20 mg total) by mouth daily. 90 tablet 3  . gabapentin (NEURONTIN) 300 MG capsule Take 300 mg by mouth 2 (two) times daily.     . Ginger, Zingiber officinalis, (GINGER EXTRACT) 250 MG CAPS Take 250 mg by mouth daily.     Donnie Aho (GLUCOSAMINE MSM COMPLEX) TABS Take 1 tablet by mouth 2 (two) times daily.    Marland Kitchen Ketotifen Fumarate ([employer] ALLERGY EYE DROPS OP) Place 1 drop into both eyes 2 (two) times daily as needed (allergies).    . metoprolol tartrate (LOPRESSOR) 25 MG tablet Take 1 tablet (25 mg total) by mouth 2 (two) times daily. 180 tablet 3  . Misc Natural Products (TART CHERRY ADVANCED) CAPS Take 1 capsule by mouth 2 (two) times daily.     . Multiple Vitamins-Minerals (ONE DAILY MULTIVITAMIN WOMEN PO) Take 1 tablet by mouth daily.    . polyethylene glycol (MIRALAX / GLYCOLAX) packet Take 17 g by mouth daily as needed for mild constipation. 14 each 0  . simvastatin (ZOCOR) 20 MG tablet Take 1 tablet (20 mg total) by mouth daily. 90 tablet 3  . TURMERIC PO Take 300 mg by mouth at bedtime.      No current facility-administered medications for this visit.     Allergies  Allergen Reactions  . Tramadol Other (See Comments)    Pt states she had a syncopal episode and confusion with Elev BP.     Family History  Problem Relation Age of Onset  . Heart disease Father   . Stroke Maternal Uncle   . Heart disease Maternal Aunt   . Cancer Neg Hx   . Diabetes Neg Hx     Social History   Socioeconomic History  . Marital status: Widowed    Spouse name: Not on file  . Number of children: Not on file  . Years of education: Not on  file  . Highest education level: Not on file  Occupational History  . Not on file  Social Needs  . Financial resource strain: Not on file  . Food insecurity:    Worry: Not on file    Inability: Not on file  . Transportation needs:    Medical: Not on file    Non-medical: Not on file  Tobacco Use  . Smoking status: Never Smoker  . Smokeless tobacco: Never Used  Substance and Sexual Activity  . Alcohol  use: Yes    Alcohol/week: 0.0 standard drinks    Comment: rare--wine  . Drug use: No  . Sexual activity: Not Currently  Lifestyle  . Physical activity:    Days per week: Not on file    Minutes per session: Not on file  . Stress: Not on file  Relationships  . Social connections:    Talks on phone: Not on file    Gets together: Not on file    Attends religious service: Not on file    Active member of club or organization: Not on file    Attends meetings of clubs or organizations: Not on file    Relationship status: Not on file  . Intimate partner violence:    Fear of current or ex partner: Not on file    Emotionally abused: Not on file    Physically abused: Not on file    Forced sexual activity: Not on file  Other Topics Concern  . Not on file  Social History Narrative  . Not on file    Hospitiliaztions: None  Health Maintenance:    Flu:07/2018  Tetanus: 10/2009  Pneumovax: 11/2015  Prevnar: 03/2013  Zostavax: never  Mammogram: no longer screening  Pap Smear: no longer screening  Bone Density: 04/2016  Colon Screening: no longer screening  Eye Doctor: annually  Dental Exam: biannually   Providers:   PCP: Webb Silversmith, NP-C  Cardiologist: Dr. Rockey Situ  Gastroenterologist: Dr. Vicente Males  Pulmonologist: Dr. Mortimer Fries  Orthopedist: Dr. Glo Herring   I have personally reviewed and have noted:  1. The patient's medical and social history 2. Their use of alcohol, tobacco or illicit drugs 3. Their current medications and supplements 4. The patient's functional ability including  ADL's, fall risks, home safety risks and hearing or visual impairment. 5. Diet and physical activities 6. Evidence for depression or mood disorder  Subjective:   Review of Systems:   Constitutional: Pt reports fatigue. Denies fever, malaise, headache or abrupt weight changes.  HEENT: Pt reports decreased hearing, runny nose. Denies eye pain, eye redness, ear pain, ringing in the ears, wax buildup, nasal congestion, bloody nose, or sore throat. Respiratory: Pt reports mild shortness of breath. Denies difficulty breathing, cough or sputum production.   Cardiovascular: Denies chest pain, chest tightness, palpitations or swelling in the hands or feet.  Gastrointestinal: Denies abdominal pain, bloating, constipation, diarrhea or blood in the stool.  GU: Pt reports urge incontinence. Denies frequency, pain with urination, burning sensation, blood in urine, odor or discharge. Musculoskeletal: Pt reports intermittent joint pain. Denies decrease in range of motion, difficulty with gait, muscle pain or joint swelling.  Skin: Denies redness, rashes, lesions or ulcercations.  Neurological: Pt reports problems with balance. Denies dizziness, difficulty with memory, difficulty with speech or problems with coordination.  Psych: Pt has a history of anxiety. Denies depression, SI/HI.  No other specific complaints in a complete review of systems (except as listed in HPI above).  Objective:  PE:   BP 126/74   Pulse 76   Temp 97.6 F (36.4 C) (Oral)   Ht 5\' 6"  (1.676 m)   Wt 189 lb (85.7 kg)   SpO2 98%   BMI 30.51 kg/m   Wt Readings from Last 3 Encounters:  08/01/18 192 lb (87.1 kg)  07/15/18 188 lb (85.3 kg)  05/10/18 188 lb (85.3 kg)    General: Appears her stated age, in NAD. Skin: Warm, dry and intact.  HEENT: Head: normal shape and size; Eyes: sclera white, no  icterus, conjunctiva pink, PERRLA and EOMs intact; Ears: Tm's gray and intact, normal light reflex; Throat/Mouth: Teeth present,  mucosa pink and moist, no exudate, lesions or ulcerations noted.  Neck: Neck supple, trachea midline. No masses, lumps present.  Cardiovascular: Normal rate and rhythm. S1,S2 noted.  No murmur, rubs or gallops noted. No JVD or BLE edema. No carotid bruits noted. Pulmonary/Chest: Normal effort and positive vesicular breath sounds. No respiratory distress. No wheezes, rales or ronchi noted.  Abdomen: Soft and nontender. Normal bowel sounds. No distention or masses noted. Liver, spleen and kidneys non palpable. Musculoskeletal: Strength 5/5 BUE/BLE. Gait slow and steady with use of cane. Neurological: Alert and oriented. Cranial nerves II-XII grossly intact. Coordination normal.  Psychiatric: Mood and affect normal. Behavior is normal. Judgment and thought content normal.     BMET    Component Value Date/Time   NA 139 04/12/2018 1459   K 4.5 04/12/2018 1459   CL 99 04/12/2018 1459   CO2 24 04/12/2018 1459   GLUCOSE 83 04/12/2018 1459   GLUCOSE 85 01/31/2018 0627   BUN 13 04/12/2018 1459   CREATININE 0.88 04/12/2018 1459   CALCIUM 9.1 04/12/2018 1459   GFRNONAA 59 (L) 04/12/2018 1459   GFRAA 68 04/12/2018 1459    Lipid Panel     Component Value Date/Time   CHOL 145 04/17/2017 1505   TRIG 96.0 04/17/2017 1505   HDL 58.00 04/17/2017 1505   CHOLHDL 3 04/17/2017 1505   VLDL 19.2 04/17/2017 1505   LDLCALC 68 04/17/2017 1505    CBC    Component Value Date/Time   WBC 5.7 04/12/2018 1459   WBC 8.2 01/31/2018 0627   RBC 4.20 04/12/2018 1459   RBC 4.01 01/31/2018 0627   HGB 11.9 04/12/2018 1459   HCT 36.2 04/12/2018 1459   PLT 245 04/12/2018 1459   MCV 86 04/12/2018 1459   MCH 28.3 04/12/2018 1459   MCH 29.7 01/31/2018 0627   MCHC 32.9 04/12/2018 1459   MCHC 34.6 01/31/2018 0627   RDW 14.4 04/12/2018 1459   LYMPHSABS 1.8 04/12/2018 1459   MONOABS 0.9 01/28/2018 1224   EOSABS 0.2 04/12/2018 1459   BASOSABS 0.1 04/12/2018 1459    Hgb A1C Lab Results  Component Value Date    HGBA1C 5.6 01/31/2018      Assessment and Plan:   Medicare Annual Wellness Visit:  Diet: She does eat meat. She consumes fruits and veggies daily. She tries to avoid fried foods. She drinks mostly water, coffee. Physical activity: Silver sneakers 3 x week Depression/mood screen: Chronic, stable on meds Hearing: Intact to whispered voice Visual acuity: Grossly normal, performs annual eye exam  ADLs: Capable, walking with walker or cane Fall risk: None Home safety: Good Cognitive evaluation: Intact to orientation, naming, recall and repetition EOL planning: Adv directives, full code/ I agree  Preventative Medicine: Flu, tetanus, pneumovax and prevnar UTD. She declines shingles vaccines. She is no longer screening for breast cancer, cervical cancer or colon cancer. Consider repeating bone density next year. Encouraged her to consume a balanced diet and exercise regimen. Advised her to see an eye doctor and dentist annually. Will check CBC, CMET, Lipid and Vit D.   Next appointment: 1 year, Medicare Wellness Exam   Webb Silversmith, NP

## 2018-10-17 DIAGNOSIS — F39 Unspecified mood [affective] disorder: Secondary | ICD-10-CM | POA: Insufficient documentation

## 2018-10-17 LAB — LIPID PANEL
Cholesterol: 151 mg/dL (ref 0–200)
HDL: 59.6 mg/dL (ref >39.00)
LDL CALC: 73 mg/dL (ref 0–99)
NonHDL: 91.63
Total CHOL/HDL Ratio: 3
Triglycerides: 91 mg/dL (ref 0.0–149.0)
VLDL: 18.2 mg/dL (ref 0.0–40.0)

## 2018-10-17 LAB — COMPREHENSIVE METABOLIC PANEL
ALT: 13 U/L (ref 0–35)
AST: 17 U/L (ref 0–37)
Albumin: 4 g/dL (ref 3.5–5.2)
Alkaline Phosphatase: 30 U/L — ABNORMAL LOW (ref 39–117)
BUN: 20 mg/dL (ref 6–23)
CO2: 31 mEq/L (ref 19–32)
Calcium: 9.5 mg/dL (ref 8.4–10.5)
Chloride: 103 mEq/L (ref 96–112)
Creatinine, Ser: 1.13 mg/dL (ref 0.40–1.20)
GFR: 45.3 mL/min — ABNORMAL LOW (ref >60.00)
Glucose, Bld: 89 mg/dL (ref 70–99)
Potassium: 4.6 mEq/L (ref 3.5–5.1)
Sodium: 141 mEq/L (ref 135–145)
Total Bilirubin: 0.4 mg/dL (ref 0.2–1.2)
Total Protein: 6.4 g/dL (ref 6.0–8.3)

## 2018-10-17 LAB — CBC
HCT: 40.1 % (ref 36.0–46.0)
Hemoglobin: 13.4 g/dL (ref 12.0–15.0)
MCHC: 33.3 g/dL (ref 30.0–36.0)
MCV: 85.6 fl (ref 78.0–100.0)
Platelets: 221 10*3/uL (ref 150.0–400.0)
RBC: 4.68 Mil/uL (ref 3.87–5.11)
RDW: 13.7 % (ref 11.5–15.5)
WBC: 5.1 10*3/uL (ref 4.0–10.5)

## 2018-10-17 LAB — VITAMIN D 25 HYDROXY (VIT D DEFICIENCY, FRACTURES): VITD: 66.08 ng/mL (ref 30.00–100.00)

## 2018-10-17 NOTE — Assessment & Plan Note (Signed)
CMET and Lipid profile today Encouraged her to consume a low saturated fat diet

## 2018-10-17 NOTE — Assessment & Plan Note (Signed)
Controlled on Advair She will continue to follow with pulmonology

## 2018-10-17 NOTE — Assessment & Plan Note (Signed)
Continue Fosamax Repeat bone density next year Continue Calcium and Vit D Encouraged 30 minutes of weight bearing exercise daily

## 2018-10-17 NOTE — Assessment & Plan Note (Signed)
Continue Cardizem, Metoprolol and Eliquis She will continue to follow with cardiology

## 2018-10-17 NOTE — Assessment & Plan Note (Signed)
Will d/c Dexilant RX for Nexium sent to pharmacy.

## 2018-10-17 NOTE — Assessment & Plan Note (Signed)
Mainly anxiety Stable on current dose of Lexapro Wean not indicated

## 2018-10-17 NOTE — Assessment & Plan Note (Signed)
Controlled on Diltiazem, Metoprolol and Lasix CMET today Reinforced DASH diet Will monitor

## 2018-10-17 NOTE — Assessment & Plan Note (Signed)
Continue Tylenol and Gabepentin Will monitor 

## 2018-10-17 NOTE — Assessment & Plan Note (Signed)
No angina CMET and lipid profile today Continue Simvastatin, Metoprolol and Eliquis She will continue to follow with cardiology

## 2018-10-17 NOTE — Assessment & Plan Note (Signed)
Compensated Continue Metoprolol and Lasix CMET today She will continue to follow with cardiology

## 2018-10-18 ENCOUNTER — Other Ambulatory Visit: Payer: Self-pay | Admitting: Internal Medicine

## 2018-11-25 DIAGNOSIS — L578 Other skin changes due to chronic exposure to nonionizing radiation: Secondary | ICD-10-CM | POA: Diagnosis not present

## 2018-11-25 DIAGNOSIS — C44311 Basal cell carcinoma of skin of nose: Secondary | ICD-10-CM | POA: Diagnosis not present

## 2018-12-04 ENCOUNTER — Telehealth: Payer: Self-pay | Admitting: Cardiovascular Disease

## 2018-12-04 NOTE — Telephone Encounter (Signed)
Spoke with the patient. She stated that she has periods when she doesn't feel good with complaints of dizziness. She feels better when she lies or sits down. She stated when she leans over it triggers the dizziness. This has been going on for several weeks now.   She currently takes: Diltiazem 120 mg daily Metoprolol Tartrate 25 bid Furosemide 20 mg daily  She stated that her blood pressure today was  99/73 this morning and then 108/69 and heart rate 71.These were both after her medications.   Sunday 108/67 heart rate 76 Monday 118/75,  heart rate 69 98/71   She has an appointment on 3/27 with Christell Faith, PA. The earliest available. She has been advised to rise slowly from standing and to sit down when she feels dizzy. Message routed to the provider for any other recommendations.

## 2018-12-04 NOTE — Telephone Encounter (Signed)
Patient calling to schedule an appointment Patient is scheduled next available 4/13 with R. Dunn  States that she has been lightheaded and blood pressure has been running fairly low Would like to be seen sooner Please call to discuss

## 2018-12-05 NOTE — Telephone Encounter (Signed)
Would decrease the diltiazem down to 30 mg po BID Previously 120 daily

## 2018-12-06 MED ORDER — DILTIAZEM HCL 30 MG PO TABS: 30.0000 mg | ORAL_TABLET | Freq: Two times a day (BID) | ORAL | 3 refills | Status: DC

## 2018-12-06 NOTE — Telephone Encounter (Signed)
Call to patient to review POC from provider. ER diltiazem d/c and 30 mg order completed and sent to [place].   Pt verbalized understanding and agreeable to POC.

## 2018-12-08 NOTE — Progress Notes (Signed)
Virtual Visit via Telephone Note    Evaluation Performed:  Follow-up visit  This visit type was conducted due to national recommendations for restrictions regarding the COVID-19 Pandemic (e.g. social distancing).  This format is felt to be most appropriate for this patient at this time.  All issues noted in this document were discussed and addressed.  No physical exam was performed (except for noted visual exam findings with Video Visits).  Please refer to the patient's chart (MyChart message for video visits and phone note for telephone visits) for the patient's consent to telehealth for [facility].  Date:  12/20/2018   ID:  Julie Davies, DOB [birth year], MRN [id number]  Patient Location:  [address] [place] [address]   Provider location:   [place] [facility]  PCP:  Jearld Fenton, NP  Cardiologist:  Ida Rogue, MD  Electrophysiologist:  None   Chief Complaint:  Telehealth follow up  History of Present Illness:    Julie Davies is a 85 y.o. female who presents via audio/video conferencing for a telehealth visit today.  She has a history of PAF diagnosed in 01/2018 on Eliquis, diastolic dysfunction, COPD, asthma, benign pulmonary nodules, presyncope/orthostasis, and prior PE in 2013 following right total knee arthroplasty who presents for tele-health follow up.   In 04/2017, she was evaluated for presyncope with associated dyspnea, chest pressure, and dizziness. Echo showed a normal LVSF. She was subsequently noted to be orthostatic. Follow up stress testing in 05/2017 was without ischemia with noted breast attenuation artifact.   She was admitted to the hospital in 01/2018 with AECOPD and new onset Afib with RVR. Echo at that time showed normal LVSF with Gr1DD and moderately elevated pulmonary pressures estimated at 40-45 mmHg. She was placed on Eliquis and rate-controlled with noted intermittent conversion to sinus rhythm followed by redevelopment  of Afib. In hospital follow up in 01/2018, she continued to note generalized fatigue, lethargy, intermittent palpitations, dizziness, and chest pressure. In this setting, she underwent repeat nuclear stress testing that was low risk without ischemia. There were fixed defects noted int he anteroseptal and apical inferior walls felt to be secondary to GI uptake artifact. Zio monitoring showed a 14% Afib burden with the longest episode lasting 14 hours and 8 minutes with an average rate of 94 bpm. Maximum rate was noted to be 155 bpm. Some patient triggered events corresponded to Afib, while others were associated with sinus rhythm. Since then, she has continued to note intermittent dizziness that resolve with laying down. She was noted to not have been checking her BP or heart rate during these episodes. She was last seen in the office in 06/2018 without changes being made.   In calling the patient to screen for tele-health visit given the COVID-19 pandemic on 12/17/2018 the patient continued to note positional dizziness with BP in the low 253G systolic with heart rates in the low to mid 60s bpm. In this setting, given her history of orthostasis, her Lopressor was decreased to 12.5 mg bid and she was advised to keep tele-health visit for 3/27.  Labs: 09/2018 - LDL 73, BUN/SCr 20/1.13, K+ 4.6, AST/ALT normal, CBC unremarkable  Patient feels like she is doing a little bit better following the decreasing of her metoprolol as above.  She has noted less positional dizziness.  Blood pressure still fluctuates between the low 644I systolic to a max of 347 systolic.  She does feel like her dizziness is worse when her blood pressure is in  the low 628B systolic.  She denies any presyncope or syncope.  No chest pain, shortness of breath, palpitations, lower extremity swelling, abdominal distention, orthopnea, PND, or early satiety.  No falls.  Tolerating Eliquis without issues.  No BRBPR or melena.  The patient does not  symptoms concerning for COVID-19 infection (fever, chills, cough, or new SHORTNESS OF BREATH).    Prior CV studies:   The following studies were reviewed today:  2D echo 01/2018: - Left ventricle: The cavity size was normal. There was mild focal   basal hypertrophy of the septum. Systolic function was normal.   The estimated ejection fraction was in the range of 60% to 65%.   Wall motion was normal; there were no regional wall motion   abnormalities. Doppler parameters are consistent with abnormal   left ventricular relaxation (grade 1 diastolic dysfunction). - Mitral valve: Calcified annulus. There was mild regurgitation. - Left atrium: The atrium was moderately dilated. - Right ventricle: The cavity size was normal. Wall thickness was   normal. Systolic function was normal. - Pulmonary arteries: Systolic pressure was mildly to moderately   increased, in the range of 40 mm Hg to 45 mm Hg. - Inferior vena cava: The vessel was mildly dilated. The   respirophasic diameter changes were in the normal range (>= 50%),   consistent with mildly elevated central venous pressure. __________  Outpatient cardiac monitoring 01/2018: Event monitor Normal sinus rhythm Avg HR of 72 bpm.  Atrial Fibrillation occurred (14% burden), ranging from 57-155 bpm (avg of 90 bpm), the longest lasting 14 hours 8 mins with an avg rate of 94 bpm.  Atrial Fibrillation was detected within +/- 45 seconds of symptomatic patient event(s).  Isolated SVEs were rare (<1.0%), SVE Couplets were rare (<1.0%), and SVE Triplets were rare (<1.0%). Isolated VEs were rare (<1.0%), VE Couplets were rare (<1.0%), and no VE Triplets were present. __________  Myoview 01/2018: Pharmacological myocardial perfusion imaging study with no significant ischemia Attenuation corrected images ( CT scan) not available secondary to technical issues Regions of fixed mildly decreased perfusion noted in the anteroseptal wall, apical inferior  region, likely secondary to GI uptake artifact, breast attenuation artifact EF estimated at 34%, depressed ejection fraction secondary to GI uptake artifact No significant regional wall motion abnormality noted No EKG changes concerning for ischemia at peak stress or in recovery. Resting EKG with intraventricular conduction delay, APCs, which may be contributing to the mild fixed perfusion defects detailed above Low risk scan, clinical correlation suggested __________  Past Medical History:  Diagnosis Date   Allergy    Arthritis    Asthma    Basal cell carcinoma of skin    Chicken pox    Diastolic dysfunction    a. TTE 5/19: EF 60-65%, mild focal basal hypertrophy, no RWMA, Gr1DD, mod dilated LA, RV cavity size nl with nl RVSF, PASP 40-45, mildly dilated IVC   GERD (gastroesophageal reflux disease)    Hiatal hernia    a. noted on Chest CT 03/2017.   History of cardiovascular stress test    a. 01/2018 MV: No ischemia. Fixed antsept, apical inferior defects - likely 2/2 GI uptake artifact (attenuation corrected images not available). Low risk.   History of kidney stones    PAF (paroxysmal atrial fibrillation) ([place])    a. diagnosed 5/19; b. CHADS2VASc = 4 (age x 2, vascular disease - noted on CT chest 7/18, female)--> Eliquis; c. 01/2018 Zio Monitor: 14% Afib.   Postural dizziness with presyncope  a. TTE 8/18: EF 55-60%, nl WM, Gr1DD, mild MR, mildly dilated LA   Pulmonary embolism on left ([place])    a. in the setting of right TKA; b. treated with Coumadin   Pulmonary nodules    a. 03/2017 Chest CT: resolution of RUL ground glass nodule. Stable, numerous small bilat pulm nodules since 2016-->benign.   Past Surgical History:  Procedure Laterality Date   APPENDECTOMY  1935   DILATION AND CURETTAGE OF UTERUS  1975   NEPHRECTOMY Right 1975   REPLACEMENT TOTAL KNEE BILATERAL  2013   VEIN LIGATION AND STRIPPING Left 1971     Current Meds  Medication Sig   ADVAIR HFA  115-21 MCG/ACT inhaler USE 2 INHALATIONS TWICE A DAY   albuterol (PROVENTIL HFA;VENTOLIN HFA) 108 (90 Base) MCG/ACT inhaler Inhale 2 puffs into the lungs every 4 (four) hours as needed for wheezing or shortness of breath.   alendronate (FOSAMAX) 70 MG tablet TAKE 1 TABLET EVERY 7 DAYS WITH A FULL GLASS OF WATER ON AN EMPTY STOMACH   apixaban (ELIQUIS) 5 MG TABS tablet Take 1 tablet (5 mg total) by mouth 2 (two) times daily.   Cholecalciferol (VITAMIN D) 2000 units CAPS Take 1 capsule by mouth daily.   diltiazem (CARDIZEM) 30 MG tablet Take 1 tablet (30 mg total) by mouth 2 (two) times daily.   docusate sodium (COLACE) 100 MG capsule Take 100-200 mg by mouth 2 (two) times daily. Take 200 mg by mouth in the morning and 100 mg by mouth at bedtime.   escitalopram (LEXAPRO) 5 MG tablet Take 1 tablet (5 mg total) by mouth daily.   fexofenadine (ALLEGRA) 180 MG tablet Take 180 mg by mouth daily.   furosemide (LASIX) 20 MG tablet Take 1 tablet (20 mg total) by mouth daily.   gabapentin (NEURONTIN) 300 MG capsule Take 300 mg by mouth 2 (two) times daily.    Ginger, Zingiber officinalis, (GINGER EXTRACT) 250 MG CAPS Take 250 mg by mouth daily.    Glucos-MSM-C-Mn-Ginger-Willow (GLUCOSAMINE MSM COMPLEX) TABS Take 1 tablet by mouth 2 (two) times daily.   metoprolol tartrate (LOPRESSOR) 25 MG tablet Take 0.5 tablets (12.5 mg total) by mouth 2 (two) times daily.   Misc Natural Products (TART CHERRY ADVANCED) CAPS Take 1 capsule by mouth 2 (two) times daily.    Multiple Vitamins-Minerals (ONE DAILY MULTIVITAMIN WOMEN PO) Take 1 tablet by mouth daily.   polyethylene glycol (MIRALAX / GLYCOLAX) packet Take 17 g by mouth daily as needed for mild constipation.   simvastatin (ZOCOR) 20 MG tablet Take 1 tablet (20 mg total) by mouth daily.   TURMERIC PO Take 300 mg by mouth at bedtime.      Allergies:   Tramadol   Social History   Tobacco Use   Smoking status: Never Smoker   Smokeless  tobacco: Never Used  Substance Use Topics   Alcohol use: Yes    Alcohol/week: 0.0 standard drinks    Comment: rare--wine   Drug use: No     Family Hx: The patient's family history includes Heart disease in her father and maternal aunt; Stroke in her maternal uncle. There is no history of Cancer or Diabetes.  ROS:   Please see the history of present illness.     All other systems reviewed and are negative.   Labs/Other Tests and Data Reviewed:    Recent Labs: 01/31/2018: Magnesium 2.1; TSH 2.561 10/16/2018: ALT 13; BUN 20; Creatinine, Ser 1.13; Hemoglobin 13.4; Platelets 221.0; Potassium 4.6; Sodium  141   Recent Lipid Panel Lab Results  Component Value Date/Time   CHOL 151 10/16/2018 02:45 PM   TRIG 91.0 10/16/2018 02:45 PM   HDL 59.60 10/16/2018 02:45 PM   CHOLHDL 3 10/16/2018 02:45 PM   LDLCALC 73 10/16/2018 02:45 PM    Wt Readings from Last 3 Encounters:  12/20/18 189 lb (85.7 kg)  10/16/18 189 lb (85.7 kg)  08/01/18 192 lb (87.1 kg)     Exam:    Vital Signs:  BP 108/68    Pulse 72    Wt 189 lb (85.7 kg)    BMI 30.51 kg/m    Well nourished, well developed female in no acute distress.   ASSESSMENT & PLAN:    1.  Dizziness: -She has a history of orthostatic hypotension however, we cannot check orthostatic vital signs over the phone -Symptoms are improved with decreasing of Lopressor earlier this week -Discontinue diltiazem -Recommend positional changes occur slowly -It is unclear if there is any component of A. fib playing a role in some of her dizziness -Could consider repeating outpatient cardiac monitoring to evaluate for A. fib burden and any potential correlation with her dizziness.  If there is found to be significant A. fib burden or correlation with her dizziness would consider antiarrhythmic therapy  2.  PAF: -She feels like she has been maintaining sinus rhythm however this is difficult to definitively evaluate over the phone -We have decreased her  Lopressor earlier this week in the setting of positional dizziness and have subsequently discontinued her diltiazem today in an effort to continue to allow for more permissive blood pressure, at least into the 941D systolic -However, with the tapering of her rate controlling medications I have warned her she may note increased A. fib burden.  Should this occur, she will contact us and we should consider amiodarone for rhythm control -Continue Eliquis -Recent CBC and BMP unrevealing  3.  Chest tightness: -Resolved -Recent Myoview in 01/2018 nonischemic and low risk -No plans for further ischemic evaluation at this time  4.  Dyspnea: -Resolved -Recent echo from 01/2018 showed normal LV systolic function with grade 1 diastolic dysfunction -Able to walk down the street without issues using her walker  6.  Hypertension: -She has a history of orthostatic hypotension -Blood pressure has been on the soft side leading to the discontinuation of diltiazem and decreasing of metoprolol as above -Continue low-dose Lopressor 12.5 mg twice daily  COVID-19 Education: The signs and symptoms of COVID-19 were discussed with the patient and how to seek care for testing (follow up with PCP or arrange E-visit).  The importance of social distancing was discussed today.  Patient Risk:   After full review of this patients clinical status, I feel that they are at least moderate risk at this time.  Time:   Today, I have spent 18 minutes with the patient with telehealth technology discussing the above.     Medication Adjustments/Labs and Tests Ordered: Current medicines are reviewed at length with the patient today.  Concerns regarding medicines are outlined above.  Tests Ordered: No orders of the defined types were placed in this encounter.  Medication Changes: Discontinue diltiazem.   Disposition:  in 1 week(s)  Signed, Christell Faith, PA-C  12/20/2018 10:59 AM    [facility]

## 2018-12-11 DIAGNOSIS — M5416 Radiculopathy, lumbar region: Secondary | ICD-10-CM | POA: Diagnosis not present

## 2018-12-11 DIAGNOSIS — M25512 Pain in left shoulder: Secondary | ICD-10-CM | POA: Diagnosis not present

## 2018-12-11 DIAGNOSIS — M25511 Pain in right shoulder: Secondary | ICD-10-CM | POA: Diagnosis not present

## 2018-12-11 DIAGNOSIS — M25519 Pain in unspecified shoulder: Secondary | ICD-10-CM | POA: Diagnosis not present

## 2018-12-17 ENCOUNTER — Telehealth: Payer: Self-pay

## 2018-12-17 ENCOUNTER — Telehealth: Payer: Self-pay | Admitting: Physician Assistant

## 2018-12-17 MED ORDER — METOPROLOL TARTRATE 25 MG PO TABS: 12.5000 mg | ORAL_TABLET | Freq: Two times a day (BID) | ORAL | 3 refills | Status: DC

## 2018-12-17 NOTE — Telephone Encounter (Signed)
    Spoke with patient as part of COVID-19 screening. She is continuing to note positional dizziness with most BPs in the low 725H systolic with heart rates in the low to mid 60s bpm. In this setting, we have decreased her Lopressor to 12.5 mg bid and she will be transitioned to tele-visit rather than an in person visit on 3/27.

## 2018-12-17 NOTE — Telephone Encounter (Signed)
TELEPHONE CALL NOTE  Julie Davies has been deemed a candidate for a follow-up tele-health visit to limit community exposure during the Covid-19 pandemic. I spoke with the patient via phone to ensure availability of phone/video source, confirm preferred email & phone number, discuss instructions and expectations, and review consent.   I reminded Julie Davies to be prepared with any vital sign and/or heart rhythm information that could potentially be obtained via home monitoring, at the time of her visit.  Finally, I reminded Julie Davies to expect an e-mail containing a link for their video-based visit approximately 15 minutes before her visit, or alternatively, a phone call at the time of her visit if her visit is planned to be a phone encounter.  Did the patient verbally consent to treatment as below? [place], [place] 12/17/2018 3:34 PM  CONSENT FOR TELE-HEALTH VISIT - PLEASE REVIEW  I hereby voluntarily request, consent and authorize [facility] and its employed or contracted physicians, physician assistants, nurse practitioners or other licensed health care professionals (the Practitioner), to provide me with telemedicine health care services (the "Services") as deemed necessary by the treating Practitioner. I acknowledge and consent to receive the Services by the Practitioner via telemedicine. I understand that the telemedicine visit will involve communicating with the Practitioner through live audiovisual communication technology and the disclosure of certain medical information by electronic transmission. I acknowledge that I have been given the opportunity to request an in-person assessment or other available alternative prior to the telemedicine visit and am voluntarily participating in the telemedicine visit.  I understand that I have the right to withhold or withdraw my consent to the use of telemedicine in the course of my care at any time, without affecting my  right to future care or treatment, and that the Practitioner or I may terminate the telemedicine visit at any time. I understand that I have the right to inspect all information obtained and/or recorded in the course of the telemedicine visit and may receive copies of available information for a reasonable fee.  I understand that some of the potential risks of receiving the Services via telemedicine include:  Marland Kitchen Delay or interruption in medical evaluation due to technological equipment failure or disruption; . Information transmitted may not be sufficient (e.g. poor resolution of images) to allow for appropriate medical decision making by the Practitioner; and/or  . In rare instances, security protocols could fail, causing a breach of personal health information.  Furthermore, I acknowledge that it is my responsibility to provide information about my medical history, conditions and care that is complete and accurate to the best of my ability. I acknowledge that Practitioner's advice, recommendations, and/or decision may be based on factors not within their control, such as incomplete or inaccurate data provided by me or distortions of diagnostic images or specimens that may result from electronic transmissions. I understand that the practice of medicine is not an exact science and that Practitioner makes no warranties or guarantees regarding treatment outcomes. I acknowledge that I will receive a copy of this consent concurrently upon execution via email to the email address I last provided but may also request a printed copy by calling the office of [place].    I understand that my insurance will be billed for this visit.   I have read or had this consent read to me. . I understand the contents of this consent, which adequately explains the benefits and risks of the Services being provided via telemedicine.  Marland Kitchen  I have been provided ample opportunity to ask questions regarding this consent and the  Services and have had my questions answered to my satisfaction. . I give my informed consent for the services to be provided through the use of telemedicine in my medical care  By participating in this telemedicine visit I agree to the above.

## 2018-12-17 NOTE — Telephone Encounter (Signed)
   Primary Cardiologist: Ida Rogue, MD   Pt contacted.  History and symptoms reviewed.  Pt will f/u with [facility] provider as scheduled, via telephone visit.    Christell Faith, PA-C  12/17/2018 10:18 AM

## 2018-12-20 ENCOUNTER — Telehealth (INDEPENDENT_AMBULATORY_CARE_PROVIDER_SITE_OTHER): Payer: Medicare Other | Admitting: Physician Assistant

## 2018-12-20 ENCOUNTER — Other Ambulatory Visit: Payer: Self-pay

## 2018-12-20 ENCOUNTER — Encounter: Payer: Self-pay | Admitting: Physician Assistant

## 2018-12-20 VITALS — BP 108/68 | HR 72 | Wt 189.0 lb

## 2018-12-20 DIAGNOSIS — R0602 Shortness of breath: Secondary | ICD-10-CM

## 2018-12-20 DIAGNOSIS — I480 Paroxysmal atrial fibrillation: Secondary | ICD-10-CM

## 2018-12-20 DIAGNOSIS — I10 Essential (primary) hypertension: Secondary | ICD-10-CM

## 2018-12-20 DIAGNOSIS — R42 Dizziness and giddiness: Secondary | ICD-10-CM

## 2018-12-20 DIAGNOSIS — R0789 Other chest pain: Secondary | ICD-10-CM

## 2018-12-20 DIAGNOSIS — I951 Orthostatic hypotension: Secondary | ICD-10-CM

## 2018-12-20 NOTE — Patient Instructions (Addendum)
It was a pleasure to speak with you on the phone today! Thank you for allowing [place] to continue taking care of your [facility] needs during this time.  Feel free to call as needed for questions and concerns related to your cardiac needs.    Medication Instructions:  Your physician has recommended you make the following change in your medication:  1- STOP Diltiazem  If you need a refill on your cardiac medications before your next appointment, please call your pharmacy.   Lab work: None ordered  If you have labs (blood work) drawn today and your tests are completely normal, you will receive your results only by: Marland Kitchen MyChart Message (if you have MyChart) OR . A paper copy in the mail If you have any lab test that is abnormal or we need to change your treatment, we will call you to review the results.  Testing/Procedures: None ordered   Follow-Up: At [facility], you and your health needs are our priority.  As part of our continuing mission to provide you with exceptional heart care, we have created designated Provider Care Teams.  These Care Teams include your primary Cardiologist (physician) and Advanced Practice Providers (APPs -  Physician Assistants and Nurse Practitioners) who all work together to provide you with the care you need, when you need it. You will need a follow up appointment in 1 weeks. You may see Ida Rogue, MD or Christell Faith, PA-C  Any Other Special Instructions Will Be Listed Below (If Applicable). Call the clinic if you have consistent blood pressure readings of systolic (top number): GREATER than 150,  LESS than 100.  Call the clinic if you are having heart rate consistently over 100.  Call the clinic if you are having worsening dizziness or fast palpitations.

## 2018-12-27 ENCOUNTER — Telehealth (INDEPENDENT_AMBULATORY_CARE_PROVIDER_SITE_OTHER): Payer: Medicare Other | Admitting: Cardiovascular Disease

## 2018-12-27 ENCOUNTER — Other Ambulatory Visit: Payer: Self-pay

## 2018-12-27 DIAGNOSIS — I5032 Chronic diastolic (congestive) heart failure: Secondary | ICD-10-CM | POA: Diagnosis not present

## 2018-12-27 DIAGNOSIS — E782 Mixed hyperlipidemia: Secondary | ICD-10-CM

## 2018-12-27 DIAGNOSIS — I10 Essential (primary) hypertension: Secondary | ICD-10-CM

## 2018-12-27 DIAGNOSIS — I480 Paroxysmal atrial fibrillation: Secondary | ICD-10-CM

## 2018-12-27 DIAGNOSIS — I2510 Atherosclerotic heart disease of native coronary artery without angina pectoris: Secondary | ICD-10-CM

## 2018-12-27 NOTE — Progress Notes (Addendum)
Virtual Visit via Video Note   This visit type was conducted due to national recommendations for restrictions regarding the COVID-19 Pandemic (e.g. social distancing) in an effort to limit this patient's exposure and mitigate transmission in our community.  Due to her co-morbid illnesses, this patient is at least at moderate risk for complications without adequate follow up.  This format is felt to be most appropriate for this patient at this time.  All issues noted in this document were discussed and addressed.  A limited physical exam was performed with this format.  Please refer to the patient's chart for her consent to telehealth for [facility].    Date:  12/27/2018   ID:  Julie Davies, DOB [birth year], MRN [id number]  Patient Location:  [address] [place] [address]   Provider location:   [facility], [place] office  PCP:  Jearld Fenton, NP  Cardiologist:  [place], [place]  Chief Complaint:  fatigue  WEB cam call  History of Present Illness:    Julie Davies is a 85 y.o. female who presents via audio/video conferencing for a telehealth visit today.   The patient does not symptoms concerning for COVID-19 infection (fever, chills, cough, or new SHORTNESS OF BREATH).   Patient has a past medical history of Mild COPD/asthma PE on left  HTN PAF Prior history of orthostasis Significant coronary disease noticed on chest CT scan Presents for f/u of her chest pain, shortness of breath, PAF  Previous hospital records  May 2019 admitted to [facility] with acute exacerbation of COPD and also new onset of A. fib with RVR.   No recent hospitalizations Complains of periodic fatigue, chest tightness, unclear if she is in atrial fibrillation at these times  Good days and bad days Does not walk much Denies chest pain or heavy shortness of breath on exertion  Mild swelling, tries to keep her legs up and wears compression hose Lasix 20  daily  ZIO monitor which showed 14% A. fib burden.    longest spell lasted 14 hours and 8 minutes with an average rate of 94 bpmon eliquis 5 BID   Vitals recorded on today's visit 123/80, pulse 63, o2 99  Lab work reviewed with her in detail Total chol 151, LDL 73 CR 1.13, Bun 20   Prior CV studies:   The following studies were reviewed today:  Stress test for chest pain 01/2018 Pharmacological myocardial perfusion imaging study with no significant ischemia Attenuation corrected images ( CT scan) not available secondary to technical issues Regions of fixed mildly decreased perfusion noted in the anteroseptal wall, apical inferior region, likely secondary to GI uptake artifact, breast attenuation artifact EF estimated at 34%, depressed ejection fraction secondary to GI uptake artifact No significant regional wall motion abnormality noted No EKG changes concerning for ischemia at peak stress or in recovery. Resting EKG with intraventricular conduction delay, APCs, which may be contributing to the mild fixed perfusion defects detailed above Low risk scan, clinical correlation suggested  Echo 01/2018,  - Left ventricle: The cavity size was normal. There was mild focal basal hypertrophy of the septum. Systolic function was normal. The estimated ejection fraction was in the range of 60% to 65%. Wall motion was normal; there were no regional wall motion abnormalities. Doppler parameters are consistent with abnormal left ventricular relaxation (grade 1 diastolic dysfunction). - Mitral valve: Calcified annulus. There was mild regurgitation. - Left atrium: The atrium was moderately dilated. - Right ventricle: The cavity  size was normal. Wall thickness was normal. Systolic function was normal. - Pulmonary arteries: Systolic pressure was mildly to moderately increased, in the range of 40 mm Hg to 45 mm Hg. - Inferior vena cava: The vessel was mildly dilated. The  respirophasic diameter changes were in the normal range (>= 50%), consistent with mildly elevated central venous pressure.   Past Medical History:  Diagnosis Date  . Allergy   . Arthritis   . Asthma   . Basal cell carcinoma of skin   . Chicken pox   . Diastolic dysfunction    a. TTE 5/19: EF 60-65%, mild focal basal hypertrophy, no RWMA, Gr1DD, mod dilated LA, RV cavity size nl with nl RVSF, PASP 40-45, mildly dilated IVC  . GERD (gastroesophageal reflux disease)   . Hiatal hernia    a. noted on Chest CT 03/2017.  Marland Kitchen History of cardiovascular stress test    a. 01/2018 MV: No ischemia. Fixed antsept, apical inferior defects - likely 2/2 GI uptake artifact (attenuation corrected images not available). Low risk.  Marland Kitchen History of kidney stones   . PAF (paroxysmal atrial fibrillation) ([place])    a. diagnosed 5/19; b. CHADS2VASc = 4 (age x 2, vascular disease - noted on CT chest 7/18, female)--> Eliquis; c. 01/2018 Zio Monitor: 14% Afib.  Marland Kitchen Postural dizziness with presyncope    a. TTE 8/18: EF 55-60%, nl WM, Gr1DD, mild MR, mildly dilated LA  . Pulmonary embolism on left [facility])    a. in the setting of right TKA; b. treated with Coumadin  . Pulmonary nodules    a. 03/2017 Chest CT: resolution of RUL ground glass nodule. Stable, numerous small bilat pulm nodules since 2016-->benign.   Past Surgical History:  Procedure Laterality Date  . APPENDECTOMY  1935  . DILATION AND CURETTAGE OF UTERUS  1975  . NEPHRECTOMY Right 1975  . REPLACEMENT TOTAL KNEE BILATERAL  2013  . VEIN LIGATION AND STRIPPING Left 1971     Current Meds  Medication Sig  . acetaminophen (TYLENOL) 500 MG tablet Take 500 mg by mouth every 4 (four) hours as needed for mild pain.   Marland Kitchen ADVAIR HFA 115-21 MCG/ACT inhaler USE 2 INHALATIONS TWICE A DAY  . albuterol (PROVENTIL HFA;VENTOLIN HFA) 108 (90 Base) MCG/ACT inhaler Inhale 2 puffs into the lungs every 4 (four) hours as needed for wheezing or shortness of breath.  Marland Kitchen  alendronate (FOSAMAX) 70 MG tablet TAKE 1 TABLET EVERY 7 DAYS WITH A FULL GLASS OF WATER ON AN EMPTY STOMACH  . apixaban (ELIQUIS) 5 MG TABS tablet Take 1 tablet (5 mg total) by mouth 2 (two) times daily.  . Cholecalciferol (VITAMIN D) 2000 units CAPS Take 1 capsule by mouth daily.  Marland Kitchen docusate sodium (COLACE) 100 MG capsule Take 100-200 mg by mouth 2 (two) times daily. Take 200 mg by mouth in the morning and 100 mg by mouth at bedtime.  Marland Kitchen escitalopram (LEXAPRO) 5 MG tablet Take 1 tablet (5 mg total) by mouth daily.  . fexofenadine (ALLEGRA) 180 MG tablet Take 180 mg by mouth daily.  . furosemide (LASIX) 20 MG tablet Take 1 tablet (20 mg total) by mouth daily.  Marland Kitchen gabapentin (NEURONTIN) 300 MG capsule Take 300 mg by mouth 2 (two) times daily.   . Ginger, Zingiber officinalis, (GINGER EXTRACT) 250 MG CAPS Take 250 mg by mouth daily.   Donnie Aho (GLUCOSAMINE MSM COMPLEX) TABS Take 1 tablet by mouth 2 (two) times daily.  . metoprolol tartrate (LOPRESSOR) 25 MG  tablet Take 0.5 tablets (12.5 mg total) by mouth 2 (two) times daily.  . Misc Natural Products (TART CHERRY ADVANCED) CAPS Take 1 capsule by mouth 2 (two) times daily.   . Multiple Vitamins-Minerals (ONE DAILY MULTIVITAMIN WOMEN PO) Take 1 tablet by mouth daily.  . polyethylene glycol (MIRALAX / GLYCOLAX) packet Take 17 g by mouth daily as needed for mild constipation.  . simvastatin (ZOCOR) 20 MG tablet Take 1 tablet (20 mg total) by mouth daily.  . TURMERIC PO Take 300 mg by mouth at bedtime.      Allergies:   Tramadol   Social History   Tobacco Use  . Smoking status: Never Smoker  . Smokeless tobacco: Never Used  Substance Use Topics  . Alcohol use: Yes    Alcohol/week: 0.0 standard drinks    Comment: rare--wine  . Drug use: No     Current Outpatient Medications on File Prior to Visit  Medication Sig Dispense Refill  . acetaminophen (TYLENOL) 500 MG tablet Take 500 mg by mouth every 4 (four) hours as needed  for mild pain.     Marland Kitchen ADVAIR HFA 115-21 MCG/ACT inhaler USE 2 INHALATIONS TWICE A DAY 36 g 3  . albuterol (PROVENTIL HFA;VENTOLIN HFA) 108 (90 Base) MCG/ACT inhaler Inhale 2 puffs into the lungs every 4 (four) hours as needed for wheezing or shortness of breath. 2 Inhaler 1  . alendronate (FOSAMAX) 70 MG tablet TAKE 1 TABLET EVERY 7 DAYS WITH A FULL GLASS OF WATER ON AN EMPTY STOMACH 12 tablet 4  . apixaban (ELIQUIS) 5 MG TABS tablet Take 1 tablet (5 mg total) by mouth 2 (two) times daily. 180 tablet 3  . Cholecalciferol (VITAMIN D) 2000 units CAPS Take 1 capsule by mouth daily.    Marland Kitchen docusate sodium (COLACE) 100 MG capsule Take 100-200 mg by mouth 2 (two) times daily. Take 200 mg by mouth in the morning and 100 mg by mouth at bedtime.    Marland Kitchen escitalopram (LEXAPRO) 5 MG tablet Take 1 tablet (5 mg total) by mouth daily. 90 tablet 2  . fexofenadine (ALLEGRA) 180 MG tablet Take 180 mg by mouth daily.    . furosemide (LASIX) 20 MG tablet Take 1 tablet (20 mg total) by mouth daily. 90 tablet 3  . gabapentin (NEURONTIN) 300 MG capsule Take 300 mg by mouth 2 (two) times daily.     . Ginger, Zingiber officinalis, (GINGER EXTRACT) 250 MG CAPS Take 250 mg by mouth daily.     Donnie Aho (GLUCOSAMINE MSM COMPLEX) TABS Take 1 tablet by mouth 2 (two) times daily.    . metoprolol tartrate (LOPRESSOR) 25 MG tablet Take 0.5 tablets (12.5 mg total) by mouth 2 (two) times daily. 180 tablet 3  . Misc Natural Products (TART CHERRY ADVANCED) CAPS Take 1 capsule by mouth 2 (two) times daily.     . Multiple Vitamins-Minerals (ONE DAILY MULTIVITAMIN WOMEN PO) Take 1 tablet by mouth daily.    . polyethylene glycol (MIRALAX / GLYCOLAX) packet Take 17 g by mouth daily as needed for mild constipation. 14 each 0  . simvastatin (ZOCOR) 20 MG tablet Take 1 tablet (20 mg total) by mouth daily. 90 tablet 3  . TURMERIC PO Take 300 mg by mouth at bedtime.      No current facility-administered medications on file  prior to visit.      Family Hx: The patient's family history includes Heart disease in her father and maternal aunt; Stroke in her maternal uncle. There  is no history of Cancer or Diabetes.  ROS:   Please see the history of present illness.    Review of Systems  Constitutional: Positive for malaise/fatigue.  Respiratory: Negative.   Cardiovascular: Negative.        Rare episodes chest tightness  Gastrointestinal: Negative.   Musculoskeletal: Negative.   Neurological: Negative.   Psychiatric/Behavioral: Negative.   All other systems reviewed and are negative.     Labs/Other Tests and Data Reviewed:    Recent Labs: 01/31/2018: Magnesium 2.1; TSH 2.561 10/16/2018: ALT 13; BUN 20; Creatinine, Ser 1.13; Hemoglobin 13.4; Platelets 221.0; Potassium 4.6; Sodium 141   Recent Lipid Panel Lab Results  Component Value Date/Time   CHOL 151 10/16/2018 02:45 PM   TRIG 91.0 10/16/2018 02:45 PM   HDL 59.60 10/16/2018 02:45 PM   CHOLHDL 3 10/16/2018 02:45 PM   LDLCALC 73 10/16/2018 02:45 PM    Wt Readings from Last 3 Encounters:  12/27/18 188 lb (85.3 kg)  12/20/18 189 lb (85.7 kg)  10/16/18 189 lb (85.7 kg)     Exam:    Vital Signs: Vital signs as detailed above in HPI  Well nourished, well developed female in no acute distress. Constitutional:  oriented to person, place, and time. No distress.  Head: Normocephalic and atraumatic.  Eyes:  no discharge. No scleral icterus.  Neck: Normal range of motion. Neck supple.  Pulmonary/Chest: No audible wheezing, no distress, appears comfortable Musculoskeletal: Normal range of motion.  no  tenderness or deformity.  Neurological:   Coordination normal. Full exam not performed Skin:  No rash Psychiatric:  normal mood and affect. behavior is normal. Thought content normal.    ASSESSMENT & PLAN:    Chronic diastolic CHF (congestive heart failure) (HCC) Euvolemic, takes Lasix daily No significant shortness of breath or leg swelling   Coronary artery calcification seen on CAT scan Cholesterol at goal, denies any anginal symptoms No further work-up  Essential hypertension Blood pressure is well controlled on today's visit. No changes made to the medications. Numbers verified on today's visit  Mixed hyperlipidemia Cholesterol is at goal on the current lipid regimen. No changes to the medications were made.  PAF (paroxysmal atrial fibrillation) (HCC) In the past has had paroxysmal fibrillation Likely has symptoms of fatigue and chest tightness when in atrial fibrillation Recommend she try to correlate her arrhythmia to her symptoms If symptoms get worse suggested she call our office for further medication adjustment   COVID-19 Education: The signs and symptoms of COVID-19 were discussed with the patient and how to seek care for testing (follow up with PCP or arrange E-visit).  The importance of social distancing was discussed today.  Patient Risk:   After full review of this patients clinical status, I feel that they are at least moderate risk at this time.  Time:   Today, I have spent 25 minutes with the patient with telehealth technology discussing correlating her paroxysmal atrial fibrillation and symptoms of malaise and chest tightness Long history of similar symptoms.     Medication Adjustments/Labs and Tests Ordered: Current medicines are reviewed at length with the patient today.  Concerns regarding medicines are outlined above.   Tests Ordered: No tests ordered   Medication Changes: No changes made   Disposition: Follow-up in 6 months   Signed, Ida Rogue, MD  12/27/2018 11:32 AM    [place] Office [address] [place] [address], [place], [place] [address]

## 2018-12-27 NOTE — Patient Instructions (Addendum)

## 2019-01-07 ENCOUNTER — Ambulatory Visit: Payer: Medicare Other | Admitting: Physician Assistant

## 2019-03-31 ENCOUNTER — Ambulatory Visit: Payer: Medicare Other | Admitting: Internal Medicine

## 2019-06-24 ENCOUNTER — Ambulatory Visit (INDEPENDENT_AMBULATORY_CARE_PROVIDER_SITE_OTHER): Payer: Medicare Other

## 2019-06-24 DIAGNOSIS — Z23 Encounter for immunization: Secondary | ICD-10-CM

## 2019-06-26 DIAGNOSIS — Z85828 Personal history of other malignant neoplasm of skin: Secondary | ICD-10-CM | POA: Diagnosis not present

## 2019-06-26 DIAGNOSIS — D2261 Melanocytic nevi of right upper limb, including shoulder: Secondary | ICD-10-CM | POA: Diagnosis not present

## 2019-06-26 DIAGNOSIS — L538 Other specified erythematous conditions: Secondary | ICD-10-CM | POA: Diagnosis not present

## 2019-06-26 DIAGNOSIS — D2262 Melanocytic nevi of left upper limb, including shoulder: Secondary | ICD-10-CM | POA: Diagnosis not present

## 2019-06-26 DIAGNOSIS — L821 Other seborrheic keratosis: Secondary | ICD-10-CM | POA: Diagnosis not present

## 2019-06-26 DIAGNOSIS — D2272 Melanocytic nevi of left lower limb, including hip: Secondary | ICD-10-CM | POA: Diagnosis not present

## 2019-06-26 DIAGNOSIS — B078 Other viral warts: Secondary | ICD-10-CM | POA: Diagnosis not present

## 2019-06-26 DIAGNOSIS — D2271 Melanocytic nevi of right lower limb, including hip: Secondary | ICD-10-CM | POA: Diagnosis not present

## 2019-06-26 DIAGNOSIS — D225 Melanocytic nevi of trunk: Secondary | ICD-10-CM | POA: Diagnosis not present

## 2019-06-26 DIAGNOSIS — Z08 Encounter for follow-up examination after completed treatment for malignant neoplasm: Secondary | ICD-10-CM | POA: Diagnosis not present

## 2019-07-02 DIAGNOSIS — Z961 Presence of intraocular lens: Secondary | ICD-10-CM | POA: Diagnosis not present

## 2019-07-11 ENCOUNTER — Other Ambulatory Visit: Payer: Self-pay | Admitting: Cardiovascular Disease

## 2019-07-15 ENCOUNTER — Other Ambulatory Visit: Payer: Self-pay | Admitting: Internal Medicine

## 2019-07-26 ENCOUNTER — Other Ambulatory Visit: Payer: Self-pay | Admitting: Cardiovascular Disease

## 2019-07-28 NOTE — Telephone Encounter (Signed)
Pt's age 85, wt 85.3 kg, SCr 1.13, CrCl 44.56, last ov w/ Dr. Rockey Situ 12/27/18.

## 2019-07-28 NOTE — Telephone Encounter (Signed)
Refill Request.  

## 2019-07-31 ENCOUNTER — Other Ambulatory Visit: Payer: Self-pay | Admitting: Cardiovascular Disease

## 2019-08-11 ENCOUNTER — Other Ambulatory Visit: Payer: Self-pay

## 2019-08-11 MED ORDER — ADVAIR HFA 115-21 MCG/ACT IN AERO: INHALATION_SPRAY | RESPIRATORY_TRACT | 0 refills | Status: DC

## 2019-08-13 ENCOUNTER — Ambulatory Visit (INDEPENDENT_AMBULATORY_CARE_PROVIDER_SITE_OTHER): Payer: Medicare Other | Admitting: Nurse Practitioner

## 2019-08-13 ENCOUNTER — Encounter: Payer: Self-pay | Admitting: Nurse Practitioner

## 2019-08-13 VITALS — BP 120/74 | HR 73 | Temp 96.8°F | Ht 67.0 in | Wt 180.0 lb

## 2019-08-13 DIAGNOSIS — I2510 Atherosclerotic heart disease of native coronary artery without angina pectoris: Secondary | ICD-10-CM | POA: Diagnosis not present

## 2019-08-13 DIAGNOSIS — R42 Dizziness and giddiness: Secondary | ICD-10-CM | POA: Diagnosis not present

## 2019-08-13 DIAGNOSIS — I480 Paroxysmal atrial fibrillation: Secondary | ICD-10-CM

## 2019-08-13 DIAGNOSIS — R0789 Other chest pain: Secondary | ICD-10-CM

## 2019-08-13 NOTE — Patient Instructions (Signed)
Medication Instructions:  Your physician recommends that you continue on your current medications as directed. Please refer to the Current Medication list given to you today.  *If you need a refill on your cardiac medications before your next appointment, please call your pharmacy*  Lab Work: Your physician recommends that you have lab work today(CBC, BMET)  If you have labs (blood work) drawn today and your tests are completely normal, you will receive your results only by: Marland Kitchen MyChart Message (if you have MyChart) OR . A paper copy in the mail If you have any lab test that is abnormal or we need to change your treatment, we will call you to review the results.  Testing/Procedures: None ordered   Follow-Up: At [facility], you and your health needs are our priority.  As part of our continuing mission to provide you with exceptional heart care, we have created designated Provider Care Teams.  These Care Teams include your primary Cardiologist (physician) and Advanced Practice Providers (APPs -  Physician Assistants and Nurse Practitioners) who all work together to provide you with the care you need, when you need it.  Your next appointment:   6 month(s)  The format for your next appointment:   In Person  Provider:    You may see Ida Rogue, MD or Murray Hodgkins, NP.

## 2019-08-13 NOTE — Progress Notes (Signed)
Office Visit    Patient Name: Julie Davies Date of Encounter: 08/13/2019  Primary Care Provider:  Jearld Fenton, NP Primary Cardiologist:  Ida Rogue, MD  Chief Complaint    85 year old female with a history of paroxysmal atrial fibrillation, COPD, asthma, benign pulmonary nodules, presyncope/orthostasis, diastolic dysfunction, and pulmonary embolism in 2003, who presents for follow-up related to orthostasis and PAF.  Past Medical History    Past Medical History:  Diagnosis Date   Allergy    Arthritis    Asthma    Basal cell carcinoma of skin    Chicken pox    Diastolic dysfunction    a. TTE 5/19: EF 60-65%, mild focal basal hypertrophy, no RWMA, Gr1DD, mod dilated LA, RV cavity size nl with nl RVSF, PASP 40-45, mildly dilated IVC   GERD (gastroesophageal reflux disease)    Hiatal hernia    a. noted on Chest CT 03/2017.   History of cardiovascular stress test    a. 01/2018 MV: No ischemia. Fixed antsept, apical inferior defects - likely 2/2 GI uptake artifact (attenuation corrected images not available). Low risk.   History of kidney stones    PAF (paroxysmal atrial fibrillation) ([place])    a. diagnosed 5/19; b. CHADS2VASc = 4 (age x 2, vascular disease - noted on CT chest 7/18, female)--> Eliquis; c. 01/2018 Zio Monitor: 14% Afib.   Postural dizziness with presyncope    a. TTE 8/18: EF 55-60%, nl WM, Gr1DD, mild MR, mildly dilated LA   Pulmonary embolism on left ([place])    a. in the setting of right TKA; b. treated with Coumadin   Pulmonary nodules    a. 03/2017 Chest CT: resolution of RUL ground glass nodule. Stable, numerous small bilat pulm nodules since 2016-->benign.   Past Surgical History:  Procedure Laterality Date   APPENDECTOMY  1935   DILATION AND CURETTAGE OF UTERUS  1975   NEPHRECTOMY Right 1975   REPLACEMENT TOTAL KNEE BILATERAL  2013   VEIN LIGATION AND STRIPPING Left 1971    Allergies  Allergies  Allergen Reactions    Tramadol Other (See Comments)    Pt states she had a syncopal episode and confusion with Elev BP.     History of Present Illness    85 year old female with above complex past medical history including paroxysmal atrial fibrillation, COPD, asthma, benign pulmonary nodules, presyncope, orthostatic hypotension, and pulmonary embolism 2013 following right total knee arthroplasty.  In August 2018, she was evaluated for presyncope with associated dyspnea, chest pressure, and dizziness.  Echo showed normal LV function and she was subsequently found to be orthostatic on examination.  Stress testing in September 2018 showed breast attenuation without ischemia.  In May 2019, she was admitted to [facility] with COPD exacerbation and new onset A. fib with RVR.  Echo showed normal LV function with grade 1 diastolic dysfunction and moderately elevated pulmonary artery systolic pressure of 40 to 45 mmHg.  In the setting of a CHA2DS2VASc of 4, she was anticoagulated with Eliquis and placed on beta-blocker and calcium channel blocker therapy.  In May 2019, she complained of fatigue, palpitations, and chest pressure and repeat stress testing was undertaken and was nonischemic.  2-week ZIO monitor showed 14% A. fib burden, the longest spell lasting 14 hours and 8 minutes with an average rate of 94 bpm.  Maximum heart rate was 155 bpm.  Chest pressure was associated with episodes of A. Fib.    She was last seen via telemedicine visit  on April 3, at which time she was doing reasonably well, though she did note periodic fatigue and chest tightness.  She was not sure if she was having any recurrent A. Fib.  Since her last visit, she has been stable.  About once or twice a week, she notes orthostatic lightheadedness but is able to specifically say that she stands up too quickly in the situations.  She is compliant with compression stockings.  Also about once or twice a week, she may have an episode of chest tightness and  mild dyspnea, similar to what she has been experiencing periodically over the past few years.  She is not sure if she is in A. fib or not when these occur.  Symptoms might last as long as 15 minutes, and resolve spontaneously.  Despite these episodes, she does not experience any chest pain or dyspnea when she takes her daily 1 mile walk.  She denies PND, orthopnea, syncope, edema, or early satiety.  Home Medications    Prior to Admission medications   Medication Sig Start Date End Date Taking? Authorizing Provider  acetaminophen (TYLENOL) 500 MG tablet Take 500 mg by mouth every 4 (four) hours as needed for mild pain.     [provider]  albuterol (PROVENTIL HFA;VENTOLIN HFA) 108 (90 Base) MCG/ACT inhaler Inhale 2 puffs into the lungs every 4 (four) hours as needed for wheezing or shortness of breath. 05/10/18   Flora Lipps, MD  alendronate (FOSAMAX) 70 MG tablet TAKE 1 TABLET EVERY 7 DAYS WITH A FULL GLASS OF WATER ON AN EMPTY STOMACH 07/30/18   Jearld Fenton, NP  Cholecalciferol (VITAMIN D) 2000 units CAPS Take 1 capsule by mouth daily.    [provider]  docusate sodium (COLACE) 100 MG capsule Take 100-200 mg by mouth 2 (two) times daily. Take 200 mg by mouth in the morning and 100 mg by mouth at bedtime.    [provider]  ELIQUIS 5 MG TABS tablet TAKE 1 TABLET TWICE A DAY 07/28/19   Minna Merritts, MD  escitalopram (LEXAPRO) 5 MG tablet TAKE 1 TABLET DAILY 07/15/19   Jearld Fenton, NP  fexofenadine (ALLEGRA) 180 MG tablet Take 180 mg by mouth daily.    [provider]  fluticasone-salmeterol (ADVAIR HFA) [id number] MCG/ACT inhaler USE 2 INHALATIONS TWICE A DAY 08/11/19   Flora Lipps, MD  furosemide (LASIX) 20 MG tablet TAKE 1 TABLET DAILY 07/11/19   Minna Merritts, MD  gabapentin (NEURONTIN) 300 MG capsule Take 300 mg by mouth 2 (two) times daily.  06/10/15   [provider]  Ginger, Zingiber officinalis, (GINGER EXTRACT) 250 MG CAPS Take 250  mg by mouth daily.     [provider]  Glucos-MSM-C-Mn-Ginger-Willow (GLUCOSAMINE MSM COMPLEX) TABS Take 1 tablet by mouth 2 (two) times daily.    [provider]  metoprolol tartrate (LOPRESSOR) 25 MG tablet Take 0.5 tablets (12.5 mg total) by mouth 2 (two) times daily. 07/11/19   Minna Merritts, MD  Misc Natural Products [facility] ADVANCED) CAPS Take 1 capsule by mouth 2 (two) times daily.     [provider]  Multiple Vitamins-Minerals (ONE DAILY MULTIVITAMIN WOMEN PO) Take 1 tablet by mouth daily.    [provider]  polyethylene glycol (MIRALAX / GLYCOLAX) packet Take 17 g by mouth daily as needed for mild constipation. 02/02/18   Gouru, Illene Silver, MD  simvastatin (ZOCOR) 20 MG tablet Take 1 tablet (20 mg total) by mouth daily. *NEEDS  OFFICE VISIT FOR FURTHER REFILLS-PLEASE CALL [phone] TO SCHEDULE.* 07/31/19   Minna Merritts, MD  TURMERIC PO Take 300 mg by mouth at bedtime.     [provider]    Review of Systems    Walks about 1 mile a day without symptoms or limitations.  Occasional chest tightness and mild dyspnea about once or twice a week, lasting 15 minutes, and resolving spontaneously.  No change in occurrence of the symptoms over the past 2 years.  Occasional orthostatic lightheadedness if she stands up too quickly.  She denies PND, orthopnea, syncope, edema, or early satiety.  All other systems reviewed and are otherwise negative except as noted above.  Physical Exam    VS:  BP 120/74 (BP Location: Left Arm, Patient Position: Sitting, Cuff Size: Normal)    Pulse 73    Temp (!) 96.8 F (36 C)    Ht 5\' 7"  (1.702 m)    Wt 180 lb (81.6 kg)    SpO2 98%    BMI 28.19 kg/m  , BMI Body mass index is 28.19 kg/m. GEN: Well nourished, well developed, in no acute distress. HEENT: normal. Neck: Supple, no JVD, carotid bruits, or masses. Cardiac: RRR, no murmurs, rubs, or gallops. No clubbing, cyanosis, edema.  Radials/PT 2+ and equal  bilaterally.  Respiratory:  Respirations regular and unlabored, clear to auscultation bilaterally. GI: Soft, nontender, nondistended, BS + x 4. MS: no deformity or atrophy. Skin: warm and dry, no rash. Neuro:  Strength and sensation are intact. Psych: Normal affect.  Accessory Clinical Findings    ECG personally reviewed by me today -baseline artifact-regular sinus rhythm, 73, left axis, LVH with IVCD, nonspecific ST and T changes- no acute changes.  Lab Results  Component Value Date   WBC 5.1 10/16/2018   HGB 13.4 10/16/2018   HCT 40.1 10/16/2018   MCV 85.6 10/16/2018   PLT 221.0 10/16/2018   Lab Results  Component Value Date   CREATININE 1.13 10/16/2018   BUN 20 10/16/2018   NA 141 10/16/2018   K 4.6 10/16/2018   CL 103 10/16/2018   CO2 31 10/16/2018   Lab Results  Component Value Date   ALT 13 10/16/2018   AST 17 10/16/2018   ALKPHOS 30 (L) 10/16/2018   BILITOT 0.4 10/16/2018   Lab Results  Component Value Date   CHOL 151 10/16/2018   HDL 59.60 10/16/2018   LDLCALC 73 10/16/2018   TRIG 91.0 10/16/2018   CHOLHDL 3 10/16/2018     Assessment & Plan    1.  Paroxysmal atrial fibrillation: Patient is in sinus rhythm today.  She is unaware of palpitations at home though does experience mild chest pressure and dyspnea about once or twice a week, lasting up to 15 minutes, and resolving spontaneously.  During prior monitoring, these episodes were associated with A. fib.  Overall, she feels as though the symptoms have been stable and she remains active, walking a mile most days of the week without symptoms or limitations.  In the setting of periodic orthostasis, she is on very low-dose metoprolol-12.5 mg twice daily.  I did advise that if she has more frequent or progressive episodes of chest pressure, that we may need to reconsider monitoring and possibly consider antiarrhythmic therapy.  She remains anticoagulated on Eliquis in the setting of a CHA2DS2VASc of 4.  I will  follow-up a CBC and basic metabolic panel today as it has been 10 months since this was last evaluated.  2.  Orthostatic lightheadedness: Symptoms have been stable, occurring about once or twice a week, specifically when she stands up too quickly.  She does wear compression socks and recognizes that if she were to be more delivered about changing positions, she would be less likely to have symptoms.  3.  Chest pressure: She continues to have intermittent and unpredictable chest pressure occurring once or twice a week, lasting as long as 15 minutes, and resolving spontaneously.  She has previously undergone stress testing for similar symptoms which was negative in 2018 and again in 2019.  Previous monitoring documented A. fib during periods of chest pressure.  No further ischemic evaluation at this time as symptoms have been stable.   4.  Disposition: Follow-up CBC and basic metabolic panel today.  Follow-up in clinic in 6 months or sooner if necessary.  Murray Hodgkins, NP 08/13/2019, 9:39 AM

## 2019-08-14 LAB — BASIC METABOLIC PANEL
BUN/Creatinine Ratio: 17 (ref 12–28)
BUN: 17 mg/dL (ref 10–36)
CO2: 23 mmol/L (ref 20–29)
Calcium: 9.2 mg/dL (ref 8.7–10.3)
Chloride: 104 mmol/L (ref 96–106)
Creatinine, Ser: 0.98 mg/dL (ref 0.57–1.00)
GFR calc Af Amer: 59 mL/min/{1.73_m2} — ABNORMAL LOW (ref >59)
GFR calc non Af Amer: 51 mL/min/{1.73_m2} — ABNORMAL LOW (ref >59)
Glucose: 95 mg/dL (ref 65–99)
Potassium: 4.8 mmol/L (ref 3.5–5.2)
Sodium: 144 mmol/L (ref 134–144)

## 2019-08-14 LAB — CBC
Hematocrit: 38.3 % (ref 34.0–46.6)
Hemoglobin: 12.8 g/dL (ref 11.1–15.9)
MCH: 28.8 pg (ref 26.6–33.0)
MCHC: 33.4 g/dL (ref 31.5–35.7)
MCV: 86 fL (ref 79–97)
Platelets: 222 10*3/uL (ref 150–450)
RBC: 4.44 x10E6/uL (ref 3.77–5.28)
RDW: 12.8 % (ref 11.7–15.4)
WBC: 5.1 10*3/uL (ref 3.4–10.8)

## 2019-08-29 ENCOUNTER — Telehealth (INDEPENDENT_AMBULATORY_CARE_PROVIDER_SITE_OTHER): Payer: Medicare Other | Admitting: Internal Medicine

## 2019-08-29 ENCOUNTER — Encounter: Payer: Self-pay | Admitting: Internal Medicine

## 2019-08-29 DIAGNOSIS — K219 Gastro-esophageal reflux disease without esophagitis: Secondary | ICD-10-CM

## 2019-08-29 DIAGNOSIS — J4520 Mild intermittent asthma, uncomplicated: Secondary | ICD-10-CM | POA: Diagnosis not present

## 2019-08-29 NOTE — Progress Notes (Signed)
Date: 08/29/2019  MRN# [id number] Alfrida Oelkers 01-28-1927  Referring Physician: NP Webb Silversmith  Janessa Wuertz is a 85 y.o. old female seen in consultation for asthma and lung nodules   I connected with the patient by telephone enabled telemedicine visit and verified that I am speaking with the correct person using two identifiers.    I discussed the limitations, risks, security and privacy concerns of performing an evaluation and management service by telemedicine and the availability of in-person appointments. I also discussed with the patient that there may be a patient responsible charge related to this service. The patient expressed understanding and agreed to proceed.  PATIENT AGREES AND CONFIRMS -YES   Other persons participating in the visit and their role in the encounter: Patient, nursing  This visit type was conducted due to national recommendations for restrictions regarding the COVID-19 Pandemic (e.g. social distancing).  This format is felt to be most appropriate for this patient at this time.  All issues noted in this document were discussed and addressed.      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 Patient has a history of lung nodules. He follow-up pulmonologist in [place], review of records show that she was following his earliest 2014 for 2 small 8 mm lower lobe nodules with mild adenopathy. Nodules are nonspecific in a patient with a history of choking, hiatal hernia and possible aspiration. At that time surveillance was recommended. Patient stated that she had her last CT in March 2016 at all pulmonologist office. Patient is a prolonged history of asthma, smoked for about 10 years quit in the 1950s, had significant secondhand smoking exposure from appearance. For her  asthma she is currently on as needed albuterol, and Flovent. Patient states she has a mild intermittent slightly productive cough at times, has not had to use albuterol rescue inhaler in a few months. However she did state that prior to walking she will take 2 puffs of albuterol. Patient is a former Nature conservation officer, had several Torres in [place] and station in various areas throughout the [place] including [place], [place], [place]. Patient states she has mild dizziness today but this is a chronic problem, she lives with her daughter, she can walk up and down stairs with mild dyspnea. Review of records showed she had one asthma exacerbation in 2015 that was treated with antibiotics and steroids. As for her pulmonary nodules based on her CAT scan from 2016 patient states she has 2 new nodules for a total of 4 nodules. At today's visit there is no CD with CT scans to compare previous lung windows. Allergies: Symptoms controlled on Xyzal, Flonase and Pataday.  HPI  Uses advair 115 HFA   No  exacerbation at this time No evidence of heart failure at this time No evidence or signs of infection at this time No respiratory distress No fevers, chills, nausea, vomiting, diarrhea No evidence of lower extremity edema No evidence hemoptysis  Has GERD, not on ppi    CT Chest 12/2016 with patient there is new RUL GGO, all other nodules are stable and no growth  CT chest 04/13/17 with patient the right upper lobe groundglass opacity has completely resolved and all the other subcentimeter nodules have been stable since 2016   Medication:    Current Outpatient Medications:  .  acetaminophen (TYLENOL) 500 MG tablet, Take 500  mg by mouth every 4 (four) hours as needed for mild pain. , Disp: , Rfl:  .  albuterol (PROVENTIL HFA;VENTOLIN HFA) 108 (90 Base) MCG/ACT inhaler, Inhale 2 puffs into the lungs every 4 (four) hours as needed for wheezing or shortness of breath., Disp: 2 Inhaler, Rfl: 1 .  alendronate  (FOSAMAX) 70 MG tablet, TAKE 1 TABLET EVERY 7 DAYS WITH A FULL GLASS OF WATER ON AN EMPTY STOMACH, Disp: 12 tablet, Rfl: 4 .  Cholecalciferol (VITAMIN D) 2000 units CAPS, Take 1 capsule by mouth daily., Disp: , Rfl:  .  docusate sodium (COLACE) 100 MG capsule, Take 100-200 mg by mouth 2 (two) times daily. Take 200 mg by mouth in the morning and 100 mg by mouth at bedtime., Disp: , Rfl:  .  ELIQUIS 5 MG TABS tablet, TAKE 1 TABLET TWICE A DAY, Disp: 180 tablet, Rfl: 0 .  escitalopram (LEXAPRO) 5 MG tablet, TAKE 1 TABLET DAILY, Disp: 90 tablet, Rfl: 0 .  fexofenadine (ALLEGRA) 180 MG tablet, Take 180 mg by mouth daily., Disp: , Rfl:  .  fluticasone-salmeterol (ADVAIR HFA) 115-21 MCG/ACT inhaler, USE 2 INHALATIONS TWICE A DAY, Disp: 36 g, Rfl: 0 .  furosemide (LASIX) 20 MG tablet, TAKE 1 TABLET DAILY, Disp: 90 tablet, Rfl: 3 .  gabapentin (NEURONTIN) 300 MG capsule, Take 300 mg by mouth 2 (two) times daily. , Disp: , Rfl:  .  Ginger, Zingiber officinalis, (GINGER EXTRACT) 250 MG CAPS, Take 250 mg by mouth daily. , Disp: , Rfl:  .  Glucos-MSM-C-Mn-Ginger-Willow (GLUCOSAMINE MSM COMPLEX) TABS, Take 1 tablet by mouth 2 (two) times daily., Disp: , Rfl:  .  metoprolol tartrate (LOPRESSOR) 25 MG tablet, Take 0.5 tablets (12.5 mg total) by mouth 2 (two) times daily., Disp: 90 tablet, Rfl: 3 .  Misc Natural Products (TART CHERRY ADVANCED) CAPS, Take 1 capsule by mouth 2 (two) times daily. , Disp: , Rfl:  .  Multiple Vitamins-Minerals (ONE DAILY MULTIVITAMIN WOMEN PO), Take 1 tablet by mouth daily., Disp: , Rfl:  .  polyethylene glycol (MIRALAX / GLYCOLAX) packet, Take 17 g by mouth daily as needed for mild constipation., Disp: 14 each, Rfl: 0 .  simvastatin (ZOCOR) 20 MG tablet, Take 1 tablet (20 mg total) by mouth daily. *NEEDS OFFICE VISIT FOR FURTHER REFILLS-PLEASE CALL [phone] TO SCHEDULE.*, Disp: 30 tablet, Rfl: 0 .  TURMERIC PO, Take 300 mg by mouth at bedtime. , Disp: , Rfl:      Allergies:   Tramadol   Review of Systems:  Gen:  Denies  fever, sweats, chills weight loss  HEENT: Denies blurred vision, double vision, ear pain, eye pain, hearing loss, nose bleeds, sore throat Cardiac:  No dizziness, chest pain or heaviness, chest tightness,edema, No JVD Resp:   No cough, -sputum production, -shortness of breath,-wheezing, -hemoptysis,  Gi: Denies swallowing difficulty, stomach pain, nausea or vomiting, diarrhea, constipation, bowel incontinence +GERD Gu:  Denies bladder incontinence, burning urine Ext:   Denies Joint pain, stiffness or swelling Skin: Denies  skin rash, easy bruising or bleeding or hives Endoc:  Denies polyuria, polydipsia , polyphagia or weight change Psych:   Denies depression, insomnia or hallucinations  Other:  All other systems negative     CT Chest 04/02/15 No findings to suggest interstitial lung disease, 8 x 10 mm microlobulated and slightly spiculated nodule in the posterior aspect of the right upper lobe, concerning for potential primary bronchogenic neoplasm. Alternatively, given the patient's history of recent pneumonia, this could represent an  area of post infectious or inflammatory scarring. There is an additional area of architectural distortion and slightly cephalad to this lesion within the right upper lobe which is also unusual, and favored to represent an area of scarring also. Mild diffuse bronchial wall thickening with mild centrilobular emphysema Three-vessel coronary artery disease  Pulmonary function testing 06/06/2013 FVC 98% FEV110% FEV1/FVC 79% RV 110 TLC 97 DLCO 73    CT chest 03/2017 1. Resolution of right upper lobe ground-glass nodule. 2. Stable numerous small bilateral pulmonary nodules since 2016. These are considered benign. 3. No new pulmonary lesions or acute pulmonary findings. 4. No mediastinal or hilar mass or lymphadenopathy. 5. Large hiatal hernia.   Assessment and Plan:   85 yo female with ASTHMA/COPD with  GERD with allergic rhinitis  #1  COPD/asthma-stable No signs of exacerbation Uses walker and uses albuterol as needed Continue advair as prescribed Has good exercise tolerance and adapts to her breathing   #2 With h/o  pulmonary nodules Pulmonary nodules-previous work up of nodules Benign in nature No need for CT scan at this time  #3 GERD Does not want any more meds for GERD Gaviscon and Tums as needed  #4 Afib chronic  On Eliquis, follow up cardiology as scheduled  COVID-19 EDUCATION: The signs and symptoms of COVID-19 were discussed with the patient and how to seek care for testing.  The importance of social distancing was discussed today. Hand Washing Techniques and avoid touching face was advised.   MEDICATION ADJUSTMENTS/LABS AND TESTS ORDERED: Continue ADVAIR as prescribed Continue meds as needed for GERD  CURRENT MEDICATIONS REVIEWED AT LENGTH WITH PATIENT TODAY   Patient satisfied with Plan of action and management. All questions answered  Follow up in 1 year  Total time spent 24 mins  Adanely Reynoso Patricia Pesa, M.D.  [facility]  Medical Director [place] Director [facility] Cardio-Pulmonary Department

## 2019-08-29 NOTE — Patient Instructions (Signed)
MEDICATION ADJUSTMENTS/LABS AND TESTS ORDERED: Continue ADVAIR as prescribed Continue meds as needed for GERD   COVID-19 COVID-19 is a respiratory infection that is caused by a virus called severe acute respiratory syndrome coronavirus 2 (SARS-CoV-2). The disease is also known as coronavirus disease or novel coronavirus. In some people, the virus may not cause any symptoms. In others, it may cause a serious infection. The infection can get worse quickly and can lead to complications, such as:  Pneumonia, or infection of the lungs.  Acute respiratory distress syndrome or ARDS. This is fluid build-up in the lungs.  Acute respiratory failure. This is a condition in which there is not enough oxygen passing from the lungs to the body.  Sepsis or septic shock. This is a serious bodily reaction to an infection.  Blood clotting problems.  Secondary infections due to bacteria or fungus. The virus that causes COVID-19 is contagious. This means that it can spread from person to person through droplets from coughs and sneezes (respiratory secretions). What are the causes? This illness is caused by a virus. You may catch the virus by:  Breathing in droplets from an infected person's cough or sneeze.  Touching something, like a table or a doorknob, that was exposed to the virus (contaminated) and then touching your mouth, nose, or eyes. What increases the risk? Risk for infection You are more likely to be infected with this virus if you:  Live in or travel to an area with a COVID-19 outbreak.  Come in contact with a sick person who recently traveled to an area with a COVID-19 outbreak.  Provide care for or live with a person who is infected with COVID-19. Risk for serious illness You are more likely to become seriously ill from the virus if you:  Are 26 years of age or older.  Have a long-term disease that lowers your body's ability to fight infection (immunocompromised).  Live in a  nursing home or long-term care facility.  Have a long-term (chronic) disease such as: ? Chronic lung disease, including chronic obstructive pulmonary disease or asthma ? Heart disease. ? Diabetes. ? Chronic kidney disease. ? Liver disease.  Are obese. What are the signs or symptoms? Symptoms of this condition can range from mild to severe. Symptoms may appear any time from 2 to 14 days after being exposed to the virus. They include:  A fever.  A cough.  Difficulty breathing.  Chills.  Muscle pains.  A sore throat.  Loss of taste or smell. Some people may also have stomach problems, such as nausea, vomiting, or diarrhea. Other people may not have any symptoms of COVID-19. How is this diagnosed? This condition may be diagnosed based on:  Your signs and symptoms, especially if: ? You live in an area with a COVID-19 outbreak. ? You recently traveled to or from an area where the virus is common. ? You provide care for or live with a person who was diagnosed with COVID-19.  A physical exam.  Lab tests, which may include: ? A nasal swab to take a sample of fluid from your nose. ? A throat swab to take a sample of fluid from your throat. ? A sample of mucus from your lungs (sputum). ? Blood tests.  Imaging tests, which may include, X-rays, CT scan, or ultrasound. How is this treated? At present, there is no medicine to treat COVID-19. Medicines that treat other diseases are being used on a trial basis to see if they are effective against  COVID-19. Your health care provider will talk with you about ways to treat your symptoms. For most people, the infection is mild and can be managed at home with rest, fluids, and over-the-counter medicines. Treatment for a serious infection usually takes places in a hospital intensive care unit (ICU). It may include one or more of the following treatments. These treatments are given until your symptoms improve.  Receiving fluids and  medicines through an IV.  Supplemental oxygen. Extra oxygen is given through a tube in the nose, a face mask, or a hood.  Positioning you to lie on your stomach (prone position). This makes it easier for oxygen to get into the lungs.  Continuous positive airway pressure (CPAP) or bi-level positive airway pressure (BPAP) machine. This treatment uses mild air pressure to keep the airways open. A tube that is connected to a motor delivers oxygen to the body.  Ventilator. This treatment moves air into and out of the lungs by using a tube that is placed in your windpipe.  Tracheostomy. This is a procedure to create a hole in the neck so that a breathing tube can be inserted.  Extracorporeal membrane oxygenation (ECMO). This procedure gives the lungs a chance to recover by taking over the functions of the heart and lungs. It supplies oxygen to the body and removes carbon dioxide. Follow these instructions at home: Lifestyle  If you are sick, stay home except to get medical care. Your health care provider will tell you how long to stay home. Call your health care provider before you go for medical care.  Rest at home as told by your health care provider.  Do not use any products that contain nicotine or tobacco, such as cigarettes, e-cigarettes, and chewing tobacco. If you need help quitting, ask your health care provider.  Return to your normal activities as told by your health care provider. Ask your health care provider what activities are safe for you. General instructions  Take over-the-counter and prescription medicines only as told by your health care provider.  Drink enough fluid to keep your urine pale yellow.  Keep all follow-up visits as told by your health care provider. This is important. How is this prevented?  There is no vaccine to help prevent COVID-19 infection. However, there are steps you can take to protect yourself and others from this virus. To protect yourself:    Do not travel to areas where COVID-19 is a risk. The areas where COVID-19 is reported change often. To identify high-risk areas and travel restrictions, check the CDC travel website: FatFares.com.br  If you live in, or must travel to, an area where COVID-19 is a risk, take precautions to avoid infection. ? Stay away from people who are sick. ? Wash your hands often with soap and water for 20 seconds. If soap and water are not available, use an alcohol-based hand sanitizer. ? Avoid touching your mouth, face, eyes, or nose. ? Avoid going out in public, follow guidance from your state and local health authorities. ? If you must go out in public, wear a cloth face covering or face mask. ? Disinfect objects and surfaces that are frequently touched every day. This may include:  Counters and tables.  Doorknobs and light switches.  Sinks and faucets.  Electronics, such as phones, remote controls, keyboards, computers, and tablets. To protect others: If you have symptoms of COVID-19, take steps to prevent the virus from spreading to others.  If you think you have a COVID-19 infection,  contact your health care provider right away. Tell your health care team that you think you may have a COVID-19 infection.  Stay home. Leave your house only to seek medical care. Do not use public transport.  Do not travel while you are sick.  Wash your hands often with soap and water for 20 seconds. If soap and water are not available, use alcohol-based hand sanitizer.  Stay away from other members of your household. Let healthy household members care for children and pets, if possible. If you have to care for children or pets, wash your hands often and wear a mask. If possible, stay in your own room, separate from others. Use a different bathroom.  Make sure that all people in your household wash their hands well and often.  Cough or sneeze into a tissue or your sleeve or elbow. Do not cough or  sneeze into your hand or into the air.  Wear a cloth face covering or face mask. Where to find more information  Centers for Disease Control and Prevention: PurpleGadgets.be  World Health Organization: https://www.castaneda.info/ Contact a health care provider if:  You live in or have traveled to an area where COVID-19 is a risk and you have symptoms of the infection.  You have had contact with someone who has COVID-19 and you have symptoms of the infection. Get help right away if:  You have trouble breathing.  You have pain or pressure in your chest.  You have confusion.  You have bluish lips and fingernails.  You have difficulty waking from sleep.  You have symptoms that get worse. These symptoms may represent a serious problem that is an emergency. Do not wait to see if the symptoms will go away. Get medical help right away. Call your local emergency services (911 in the [place] Do not drive yourself to the hospital. Let the emergency medical personnel know if you think you have COVID-19. Summary  COVID-19 is a respiratory infection that is caused by a virus. It is also known as coronavirus disease or novel coronavirus. It can cause serious infections, such as pneumonia, acute respiratory distress syndrome, acute respiratory failure, or sepsis.  The virus that causes COVID-19 is contagious. This means that it can spread from person to person through droplets from coughs and sneezes.  You are more likely to develop a serious illness if you are 62 years of age or older, have a weak immunity, live in a nursing home, or have chronic disease.  There is no medicine to treat COVID-19. Your health care provider will talk with you about ways to treat your symptoms.  Take steps to protect yourself and others from infection. Wash your hands often and disinfect objects and surfaces that are frequently touched every day. Stay away from people who are  sick and wear a mask if you are sick. This information is not intended to replace advice given to you by your health care provider. Make sure you discuss any questions you have with your health care provider. Document Released: 10/17/2018 Document Revised: 02/06/2019 Document Reviewed: 10/17/2018 Elsevier Patient Education  2020 [employer].

## 2019-09-23 ENCOUNTER — Other Ambulatory Visit: Payer: Self-pay | Admitting: Internal Medicine

## 2019-09-23 DIAGNOSIS — M810 Age-related osteoporosis without current pathological fracture: Secondary | ICD-10-CM

## 2019-09-25 ENCOUNTER — Ambulatory Visit (INDEPENDENT_AMBULATORY_CARE_PROVIDER_SITE_OTHER): Payer: Medicare Other | Admitting: Family Medicine

## 2019-09-25 ENCOUNTER — Encounter: Payer: Self-pay | Admitting: Family Medicine

## 2019-09-25 DIAGNOSIS — J439 Emphysema, unspecified: Secondary | ICD-10-CM | POA: Diagnosis not present

## 2019-09-25 DIAGNOSIS — J069 Acute upper respiratory infection, unspecified: Secondary | ICD-10-CM

## 2019-09-25 MED ORDER — ALBUTEROL SULFATE HFA 108 (90 BASE) MCG/ACT IN AERS: 2.0000 | INHALATION_SPRAY | RESPIRATORY_TRACT | 0 refills | Status: AC | PRN

## 2019-09-25 MED ORDER — GUAIFENESIN-CODEINE 100-10 MG/5ML PO SYRP: 5.0000 mL | ORAL_SOLUTION | Freq: Three times a day (TID) | ORAL | 0 refills | Status: DC | PRN

## 2019-09-25 NOTE — Patient Instructions (Addendum)
Call the number or go to the website to get a covid test scheduled  Rest/drink fluids  Take the cough medicine with codeine with caution of sedation Use inhaler as needed  Isolate yourself until you get a negative covid test and you feel better   If symptoms worsen-please alert [place] and go to the closest urgent care for evaluation  Watch for shortness of breath/worse cough or fever   Keep [place] posted

## 2019-09-25 NOTE — Progress Notes (Signed)
Virtual Visit via Telephone Note  I connected with [name] on 09/25/19 at 10:30 AM EST by telephone and verified that I am speaking with the correct person using two identifiers.  Location: Patient: home Provider: office    I discussed the limitations, risks, security and privacy concerns of performing an evaluation and management service by telephone and the availability of in person appointments. I also discussed with the patient that there may be a patient responsible charge related to this service. The patient expressed understanding and agreed to proceed.  Parties involved in encounter  Patient: Julie Davies  Provider:  Loura Pardon MD     History of Present Illness: Pt presents with sinus symptoms and cough/nasal congestion   85 yo pt of NP Baity   Coughing on and off for a while  Thought it was from her acid reflux  Has a h/o asthma   (past lung nodules) Is utd on pna vaccines   Also has dizzy spells when she turns fast   Her upper chest is sore from coughing  Wakes up coughing (even when sleeping elevated)   Asthma is not bothering her today  She uses albuterol for exercise -does very well with it  Uses advair   Getting worse  Bringing up - phlegm , it is a yellow color  Some congestion and ear pressure  Some sinus pressure   No ST  Has had some low grade temp  achey a little at times   No n/v/d or abd pain   No loss of taste or smell    183 lb  137/90  Pulse 83      No known exposures to covid Has been out in the car with her grandson  (he is not sick)  Family is in and out  Conrad outside at times   Otc: Takes - generic of allegra  Has tylenol on hand   Has outdated /old px for  Robitussin with codeine (she tolerates well)  Cannot take tramadol however      Patient Active Problem List   Diagnosis Date Noted  . Viral URI with cough 09/25/2019  . Mood disorder ([address]) 10/17/2018  . Coronary artery calcification seen on CAT scan  07/15/2018  . PAF (paroxysmal atrial fibrillation) ([place]) 07/15/2018  . Chronic diastolic CHF (congestive heart failure) ([place]) 07/15/2018  . Asthma 04/17/2017  . Age-related osteoporosis without current pathological fracture 10/16/2016  . Pulmonary nodules 03/12/2015  . Essential hypertension 09/10/2014  . HLD (hyperlipidemia) 09/10/2014  . Gastroesophageal reflux disease without esophagitis 09/10/2014  . Arthritis 09/10/2014   Past Medical History:  Diagnosis Date  . Allergy   . Arthritis   . Asthma   . Basal cell carcinoma of skin   . Chicken pox   . Diastolic dysfunction    a. TTE 5/19: EF 60-65%, mild focal basal hypertrophy, no RWMA, Gr1DD, mod dilated LA, RV cavity size nl with nl RVSF, PASP 40-45, mildly dilated IVC  . GERD (gastroesophageal reflux disease)   . Hiatal hernia    a. noted on Chest CT 03/2017.  Marland Kitchen History of cardiovascular stress test    a. 01/2018 MV: No ischemia. Fixed antsept, apical inferior defects - likely 2/2 GI uptake artifact (attenuation corrected images not available). Low risk.  Marland Kitchen History of kidney stones   . PAF (paroxysmal atrial fibrillation) ([place])    a. diagnosed 5/19; b. CHADS2VASc = 4 (age x 2, vascular disease - noted on CT chest 7/18, female)--> Eliquis; c.  01/2018 Zio Monitor: 14% Afib.  Marland Kitchen Postural dizziness with presyncope    a. TTE 8/18: EF 55-60%, nl WM, Gr1DD, mild MR, mildly dilated LA  . Pulmonary embolism on left [facility])    a. in the setting of right TKA; b. treated with Coumadin  . Pulmonary nodules    a. 03/2017 Chest CT: resolution of RUL ground glass nodule. Stable, numerous small bilat pulm nodules since 2016-->benign.   Past Surgical History:  Procedure Laterality Date  . APPENDECTOMY  1935  . DILATION AND CURETTAGE OF UTERUS  1975  . NEPHRECTOMY Right 1975  . REPLACEMENT TOTAL KNEE BILATERAL  2013  . VEIN LIGATION AND STRIPPING Left 1971   Social History   Tobacco Use  . Smoking status: Never Smoker  . Smokeless tobacco:  Never Used  Substance Use Topics  . Alcohol use: Yes    Alcohol/week: 0.0 standard drinks    Comment: rare--wine  . Drug use: No   Family History  Problem Relation Age of Onset  . Heart disease Father   . Stroke Maternal Uncle   . Heart disease Maternal Aunt   . Cancer Neg Hx   . Diabetes Neg Hx    Allergies  Allergen Reactions  . Tramadol Other (See Comments)    Pt states she had a syncopal episode and confusion with Elev BP.    Current Outpatient Medications on File Prior to Visit  Medication Sig Dispense Refill  . acetaminophen (TYLENOL) 500 MG tablet Take 500 mg by mouth every 4 (four) hours as needed for mild pain.     Marland Kitchen alendronate (FOSAMAX) 70 MG tablet TAKE 1 TABLET EVERY 7 DAYS WITH A FULL GLASS OF WATER ON AN EMPTY STOMACH 12 tablet 0  . Cholecalciferol (VITAMIN D) 2000 units CAPS Take 1 capsule by mouth daily.    Marland Kitchen docusate sodium (COLACE) 100 MG capsule Take 100-200 mg by mouth 2 (two) times daily. Take 200 mg by mouth in the morning and 100 mg by mouth at bedtime.    Marland Kitchen ELIQUIS 5 MG TABS tablet TAKE 1 TABLET TWICE A DAY 180 tablet 0  . escitalopram (LEXAPRO) 5 MG tablet TAKE 1 TABLET DAILY 90 tablet 0  . fexofenadine (ALLEGRA) 180 MG tablet Take 180 mg by mouth daily.    . fluticasone-salmeterol (ADVAIR HFA) 115-21 MCG/ACT inhaler USE 2 INHALATIONS TWICE A DAY 36 g 0  . furosemide (LASIX) 20 MG tablet TAKE 1 TABLET DAILY 90 tablet 3  . gabapentin (NEURONTIN) 300 MG capsule Take 300 mg by mouth 2 (two) times daily.     . Ginger, Zingiber officinalis, (GINGER EXTRACT) 250 MG CAPS Take 250 mg by mouth daily.     Donnie Aho (GLUCOSAMINE MSM COMPLEX) TABS Take 1 tablet by mouth 2 (two) times daily.    . metoprolol tartrate (LOPRESSOR) 25 MG tablet Take 0.5 tablets (12.5 mg total) by mouth 2 (two) times daily. 90 tablet 3  . Misc Natural Products (TART CHERRY ADVANCED) CAPS Take 1 capsule by mouth 2 (two) times daily.     . Multiple Vitamins-Minerals  (ONE DAILY MULTIVITAMIN WOMEN PO) Take 1 tablet by mouth daily.    . polyethylene glycol (MIRALAX / GLYCOLAX) packet Take 17 g by mouth daily as needed for mild constipation. 14 each 0  . simvastatin (ZOCOR) 20 MG tablet Take 1 tablet (20 mg total) by mouth daily. *NEEDS OFFICE VISIT FOR FURTHER REFILLS-PLEASE CALL [phone] TO SCHEDULE.* 30 tablet 0  . TURMERIC PO Take 300 mg  by mouth at bedtime.      No current facility-administered medications on file prior to visit.    Review of Systems  Constitutional: Positive for malaise/fatigue. Negative for chills and fever.  HENT: Negative for congestion, ear pain, sinus pain and sore throat.   Eyes: Negative for blurred vision, discharge and redness.  Respiratory: Positive for cough and sputum production. Negative for shortness of breath, wheezing and stridor.   Cardiovascular: Negative for chest pain, palpitations and leg swelling.  Gastrointestinal: Negative for abdominal pain, diarrhea, nausea and vomiting.  Musculoskeletal: Negative for myalgias.  Skin: Negative for rash.  Neurological: Negative for dizziness and headaches.    Observations/Objective: Pt sounds well, in no distress Talkative Not hoarse No sob or wheezing noted Few hacking coughs during interview Nl mood/affect Nl cognition/ good historian  Assessment and Plan: Problem List Items Addressed This Visit      Respiratory   Viral URI with cough    Low grade temp  Cough/congestion  Mildly prod cough (her asthma is not bothering her)  No sob No known exp to covid but her family is in and out  Adv to get tested for covid -given info  Also go to ER over the weekend if symptoms worsen  Robitussin AC sent in for cough/use with caution  Disc s/s to watch for  / sob /fever especially  Refilled albuterol for prn use  Update if not starting to improve in a week or if worsening           Other Visit Diagnoses    Pulmonary emphysema, unspecified emphysema type ([place])        Relevant Medications   guaiFENesin-codeine (ROBITUSSIN AC) 100-10 MG/5ML syrup   albuterol (VENTOLIN HFA) 108 (90 Base) MCG/ACT inhaler       Follow Up Instructions: Call the number or go to the website to get a covid test scheduled  Rest/drink fluids  Take the cough medicine with codeine with caution of sedation Use inhaler as needed  Isolate yourself until you get a negative covid test and you feel better   If symptoms worsen-please alert [place] and go to the closest urgent care for evaluation  Watch for shortness of breath/worse cough or fever   Keep [place] posted    I discussed the assessment and treatment plan with the patient. The patient was provided an opportunity to ask questions and all were answered. The patient agreed with the plan and demonstrated an understanding of the instructions.   The patient was advised to call back or seek an in-person evaluation if the symptoms worsen or if the condition fails to improve as anticipated.  I provided 17 minutes of non-face-to-face time during this encounter.   Loura Pardon, MD

## 2019-09-25 NOTE — Assessment & Plan Note (Addendum)
Low grade temp  Cough/congestion  Mildly prod cough (her asthma is not bothering her)  No sob No known exp to covid but her family is in and out  Adv to get tested for covid -given info  Also go to ER over the weekend if symptoms worsen  Robitussin AC sent in for cough/use with caution  Disc s/s to watch for  / sob /fever especially  Refilled albuterol for prn use  Update if not starting to improve in a week or if worsening

## 2019-09-26 ENCOUNTER — Encounter: Payer: Self-pay | Admitting: Family Medicine

## 2019-09-29 ENCOUNTER — Ambulatory Visit: Payer: Medicare Other | Attending: Internal Medicine

## 2019-09-29 DIAGNOSIS — Z20822 Contact with and (suspected) exposure to covid-19: Secondary | ICD-10-CM

## 2019-09-30 LAB — NOVEL CORONAVIRUS, NAA: SARS-CoV-2, NAA: NOT DETECTED

## 2019-10-01 ENCOUNTER — Telehealth: Payer: Self-pay | Admitting: Internal Medicine

## 2019-10-01 NOTE — Telephone Encounter (Signed)
Negative COVID results given. Patient results "NOT Detected." Caller expressed understanding. ° °

## 2019-10-13 ENCOUNTER — Other Ambulatory Visit: Payer: Self-pay | Admitting: Internal Medicine

## 2019-10-23 ENCOUNTER — Encounter: Payer: Self-pay | Admitting: Emergency Medicine

## 2019-10-23 ENCOUNTER — Ambulatory Visit
Admission: RE | Admit: 2019-10-23 | Discharge: 2019-10-23 | Disposition: A | Discharge status: Home / Self Care | Payer: Medicare Other | Source: Ambulatory Visit | Attending: Emergency Medicine | Admitting: Emergency Medicine

## 2019-10-23 ENCOUNTER — Other Ambulatory Visit: Payer: Self-pay

## 2019-10-23 ENCOUNTER — Telehealth: Payer: Self-pay

## 2019-10-23 ENCOUNTER — Ambulatory Visit
Admission: EM | Admit: 2019-10-23 | Discharge: 2019-10-23 | Disposition: A | Discharge status: Home / Self Care | Payer: Medicare Other | Attending: Emergency Medicine | Admitting: Emergency Medicine

## 2019-10-23 DIAGNOSIS — R05 Cough: Secondary | ICD-10-CM | POA: Insufficient documentation

## 2019-10-23 DIAGNOSIS — K449 Diaphragmatic hernia without obstruction or gangrene: Secondary | ICD-10-CM | POA: Diagnosis not present

## 2019-10-23 DIAGNOSIS — Z20822 Contact with and (suspected) exposure to covid-19: Secondary | ICD-10-CM | POA: Insufficient documentation

## 2019-10-23 DIAGNOSIS — M4856XA Collapsed vertebra, not elsewhere classified, lumbar region, initial encounter for fracture: Secondary | ICD-10-CM | POA: Diagnosis not present

## 2019-10-23 DIAGNOSIS — R5383 Other fatigue: Secondary | ICD-10-CM | POA: Diagnosis not present

## 2019-10-23 DIAGNOSIS — R509 Fever, unspecified: Secondary | ICD-10-CM | POA: Diagnosis not present

## 2019-10-23 DIAGNOSIS — J069 Acute upper respiratory infection, unspecified: Secondary | ICD-10-CM | POA: Insufficient documentation

## 2019-10-23 DIAGNOSIS — R059 Cough, unspecified: Secondary | ICD-10-CM

## 2019-10-23 DIAGNOSIS — I700 Atherosclerosis of aorta: Secondary | ICD-10-CM | POA: Insufficient documentation

## 2019-10-23 DIAGNOSIS — R0602 Shortness of breath: Secondary | ICD-10-CM | POA: Insufficient documentation

## 2019-10-23 LAB — POCT URINALYSIS DIP (MANUAL ENTRY)
Bilirubin, UA: NEGATIVE
Blood, UA: NEGATIVE
Glucose, UA: NEGATIVE mg/dL
Ketones, POC UA: NEGATIVE mg/dL
Nitrite, UA: NEGATIVE
Protein Ur, POC: NEGATIVE mg/dL
Spec Grav, UA: 1.02 (ref 1.010–1.025)
Urobilinogen, UA: 0.2 E.U./dL
pH, UA: 6 (ref 5.0–8.0)

## 2019-10-23 LAB — POC SARS CORONAVIRUS 2 AG -  ED: SARS Coronavirus 2 Ag: NEGATIVE

## 2019-10-23 NOTE — ED Provider Notes (Signed)
[name]    CSN: [id number] Arrival date & time: 10/23/19  1543      History   Chief Complaint Chief Complaint  Patient presents with  . Fever  . Cough    HPI Julie Davies is a 86 y.o. female.  Patient presents with fever, cough productive of thick white mucous, SOB, and fatigue x 1 day.  Negative COVID on 09/29/2019.  Tmax 100.8 today; she had a temp of 101 and chills on 10/18/2019 but none since, until last night.  She denies sore throat, rash, dysuria, abdominal pain, back pain, vomiting, diarrhea, or other symptoms.  Treating symptoms at home with Tylenol.    The history is provided by the patient.    Past Medical History:  Diagnosis Date  . Allergy   . Arthritis   . Asthma   . Basal cell carcinoma of skin   . Chicken pox   . Diastolic dysfunction    a. TTE 5/19: EF 60-65%, mild focal basal hypertrophy, no RWMA, Gr1DD, mod dilated LA, RV cavity size nl with nl RVSF, PASP 40-45, mildly dilated IVC  . GERD (gastroesophageal reflux disease)   . Hiatal hernia    a. noted on Chest CT 03/2017.  Marland Kitchen History of cardiovascular stress test    a. 01/2018 MV: No ischemia. Fixed antsept, apical inferior defects - likely 2/2 GI uptake artifact (attenuation corrected images not available). Low risk.  Marland Kitchen History of kidney stones   . PAF (paroxysmal atrial fibrillation) ([place])    a. diagnosed 5/19; b. CHADS2VASc = 4 (age x 2, vascular disease - noted on CT chest 7/18, female)--> Eliquis; c. 01/2018 Zio Monitor: 14% Afib.  Marland Kitchen Postural dizziness with presyncope    a. TTE 8/18: EF 55-60%, nl WM, Gr1DD, mild MR, mildly dilated LA  . Pulmonary embolism on left [facility])    a. in the setting of right TKA; b. treated with Coumadin  . Pulmonary nodules    a. 03/2017 Chest CT: resolution of RUL ground glass nodule. Stable, numerous small bilat pulm nodules since 2016-->benign.    Patient Active Problem List   Diagnosis Date Noted  . Viral URI with cough 09/25/2019  . Mood disorder ([place])  10/17/2018  . Coronary artery calcification seen on CAT scan 07/15/2018  . PAF (paroxysmal atrial fibrillation) ([place]) 07/15/2018  . Chronic diastolic CHF (congestive heart failure) ([place]) 07/15/2018  . Asthma 04/17/2017  . Age-related osteoporosis without current pathological fracture 10/16/2016  . Pulmonary nodules 03/12/2015  . Essential hypertension 09/10/2014  . HLD (hyperlipidemia) 09/10/2014  . Gastroesophageal reflux disease without esophagitis 09/10/2014  . Arthritis 09/10/2014    Past Surgical History:  Procedure Laterality Date  . APPENDECTOMY  1935  . DILATION AND CURETTAGE OF UTERUS  1975  . NEPHRECTOMY Right 1975  . REPLACEMENT TOTAL KNEE BILATERAL  2013  . VEIN LIGATION AND STRIPPING Left 1971    OB History   No obstetric history on file.      Home Medications    Prior to Admission medications   Medication Sig Start Date End Date Taking? Authorizing Provider  acetaminophen (TYLENOL) 500 MG tablet Take 500 mg by mouth every 4 (four) hours as needed for mild pain.    Yes [provider]  albuterol (VENTOLIN HFA) 108 (90 Base) MCG/ACT inhaler Inhale 2 puffs into the lungs every 4 (four) hours as needed for wheezing or shortness of breath. 09/25/19  Yes Tower, Wynelle Fanny, MD  alendronate (FOSAMAX) 70 MG tablet TAKE 1  TABLET EVERY 7 DAYS WITH A FULL GLASS OF WATER ON AN EMPTY STOMACH 09/23/19  Yes Jearld Fenton, NP  Cholecalciferol (VITAMIN D) 2000 units CAPS Take 1 capsule by mouth daily.   Yes [provider]  docusate sodium (COLACE) 100 MG capsule Take 100-200 mg by mouth 2 (two) times daily. Take 200 mg by mouth in the morning and 100 mg by mouth at bedtime.   Yes [provider]  ELIQUIS 5 MG TABS tablet TAKE 1 TABLET TWICE A DAY 07/28/19  Yes Gollan, Kathlene November, MD  escitalopram (LEXAPRO) 5 MG tablet TAKE 1 TABLET DAILY 10/13/19  Yes Baity, Coralie Keens, NP  fexofenadine (ALLEGRA) 180 MG tablet Take 180 mg by mouth daily.   Yes [provider]  fluticasone-salmeterol (ADVAIR HFA) 115-21 MCG/ACT inhaler USE 2 INHALATIONS TWICE A DAY 08/11/19  Yes Kasa, Maretta Bees, MD  furosemide (LASIX) 20 MG tablet TAKE 1 TABLET DAILY 07/11/19  Yes Gollan, Kathlene November, MD  gabapentin (NEURONTIN) 300 MG capsule Take 300 mg by mouth 2 (two) times daily.  06/10/15  Yes [provider]  Ginger, Zingiber officinalis, (GINGER EXTRACT) 250 MG CAPS Take 250 mg by mouth daily.    Yes [provider]  Glucos-MSM-C-Mn-Ginger-Willow (GLUCOSAMINE MSM COMPLEX) TABS Take 1 tablet by mouth 2 (two) times daily.   Yes [provider]  guaiFENesin-codeine (ROBITUSSIN AC) 100-10 MG/5ML syrup Take 5 mLs by mouth 3 (three) times daily as needed for cough. Caution of sedation 09/25/19  Yes Tower, Wynelle Fanny, MD  metoprolol tartrate (LOPRESSOR) 25 MG tablet Take 0.5 tablets (12.5 mg total) by mouth 2 (two) times daily. 07/11/19  Yes Gollan, Kathlene November, MD  Misc Natural Products (TART CHERRY ADVANCED) CAPS Take 1 capsule by mouth 2 (two) times daily.    Yes [provider]  Multiple Vitamins-Minerals (ONE DAILY MULTIVITAMIN WOMEN PO) Take 1 tablet by mouth daily.   Yes [provider]  polyethylene glycol (MIRALAX / GLYCOLAX) packet Take 17 g by mouth daily as needed for mild constipation. 02/02/18  Yes Gouru, Illene Silver, MD  simvastatin (ZOCOR) 20 MG tablet Take 1 tablet (20 mg total) by mouth daily. *NEEDS OFFICE VISIT FOR FURTHER REFILLS-PLEASE CALL [phone] TO SCHEDULE.* 07/31/19  Yes Minna Merritts, MD  TURMERIC PO Take 300 mg by mouth at bedtime.    Yes [provider]    Family History Family History  Problem Relation Age of Onset  . Heart disease Father   . Stroke Maternal Uncle   . Heart disease Maternal Aunt   . Cancer Neg Hx   . Diabetes Neg Hx     Social History Social History   Tobacco Use  . Smoking status: Never Smoker  . Smokeless tobacco: Never Used  Substance Use Topics  . Alcohol use: Yes     Alcohol/week: 0.0 standard drinks    Comment: rare--wine  . Drug use: No     Allergies   Tramadol   Review of Systems Review of Systems  Constitutional: Positive for fatigue and fever. Negative for chills.  HENT: Negative for ear pain and sore throat.   Eyes: Negative for pain and visual disturbance.  Respiratory: Positive for cough and shortness of breath.   Cardiovascular: Negative for chest pain and palpitations.  Gastrointestinal: Negative for abdominal pain and vomiting.  Genitourinary: Negative for dysuria and hematuria.  Musculoskeletal: Negative for arthralgias and back pain.  Skin: Negative for color change and rash.  Neurological: Negative for seizures and syncope.  All other systems reviewed and are negative.    Physical Exam Triage Vital Signs ED Triage Vitals  Enc Vitals Group     BP      Pulse      Resp      Temp      Temp src      SpO2      Weight      Height      Head Circumference      Peak Flow      Pain Score      Pain Loc      Pain Edu?      Excl. in [place]?    No data found.  Updated Vital Signs BP 90/65 (BP Location: Left Arm)   Pulse 87   Temp 98.9 F (37.2 C) (Oral)   Resp 20   Ht 5\' 7"  (1.702 m)   Wt 184 lb (83.5 kg)   SpO2 95%   BMI 28.82 kg/m   Visual Acuity Right Eye Distance:   Left Eye Distance:   Bilateral Distance:    Right Eye Near:   Left Eye Near:    Bilateral Near:     Physical Exam Vitals and nursing note reviewed.  Constitutional:      General: She is not in acute distress.    Appearance: She is well-developed. She is not ill-appearing.  HENT:     Head: Normocephalic and atraumatic.     Right Ear: Tympanic membrane normal.     Left Ear: Tympanic membrane normal.     Ears:     Comments: Bilateral hearing aids.    Nose: Nose normal.     Mouth/Throat:     Mouth: Mucous membranes are moist.     Pharynx: Oropharynx is clear.  Eyes:     Conjunctiva/sclera: Conjunctivae normal.  Cardiovascular:      Rate and Rhythm: Normal rate and regular rhythm.     Heart sounds: No murmur.  Pulmonary:     Effort: Pulmonary effort is normal. No respiratory distress.     Breath sounds: Normal breath sounds. No wheezing or rhonchi.  Abdominal:     General: Bowel sounds are normal.     Palpations: Abdomen is soft.     Tenderness: There is no abdominal tenderness. There is no right CVA tenderness, left CVA tenderness, guarding or rebound.  Musculoskeletal:     Cervical back: Neck supple.  Skin:    General: Skin is warm and dry.     Findings: No rash.  Neurological:     General: No focal deficit present.     Mental Status: She is alert and oriented to person, place, and time.     Comments: Ambulatory with cane.  Psychiatric:        Mood and Affect: Mood normal.        Behavior: Behavior normal.      UC Treatments / Results  Labs (all labs ordered are listed, but only abnormal results are displayed) Labs Reviewed  POCT URINALYSIS DIP (MANUAL ENTRY) - Abnormal; Notable for the following components:      Result Value   Leukocytes, UA Trace (*)    All other components within normal limits  NOVEL CORONAVIRUS, NAA  URINE CULTURE  POC SARS CORONAVIRUS 2 AG -  ED    EKG   Radiology DG Chest 2 View  Result Date: 10/23/2019 CLINICAL DATA:  Fever cough short of breath EXAM: CHEST - 2 VIEW COMPARISON:  02/01/2018 FINDINGS: The  heart size and mediastinal contours are within normal limits. Both lungs are clear. Aortic atherosclerosis. Chronic wedging deformities at the upper lumbar spine. Hiatal hernia IMPRESSION: No active cardiopulmonary disease. Electronically Signed   By: Donavan Foil M.D.   On: 10/23/2019 17:08    Procedures Procedures (including critical care time)  Medications Ordered in UC Medications - No data to display  Initial Impression / Assessment and Plan / UC Course  I have reviewed the triage vital signs and the nursing notes.  Pertinent labs & imaging results that were  available during my care of the patient were reviewed by me and considered in my medical decision making (see chart for details).   Viral URI with cough.  CXR negative.  Urine culture pending.  POC COVID negative; PCR pending.  Instructed patient to self quarantine until the test result is back and to take Tylenol as needed for fever/discomfort.  Instructed patient to go to the emergency department if she develops high fever, shortness of breath, severe diarrhea, or other concerning symptoms.  Patient agrees with plan of care.    Final Clinical Impressions(s) / UC Diagnoses   Final diagnoses:  Cough  Viral URI with cough     Discharge Instructions     Go directly to [facility] for your chest x-ray.  Take Tylenol as needed for fever or discomfort.    Your urine culture is pending.    Your rapid COVID test is negative; the send-out test is pending.  You should self quarantine until your test result is back and is negative.    Go to the emergency department if you develop high fever, shortness of breath, severe diarrhea, or other concerning symptoms.       ED Prescriptions    None     PDMP not reviewed this encounter.   Sharion Balloon, NP 10/23/19 1718

## 2019-10-23 NOTE — Telephone Encounter (Signed)
Could we have not done a virtual appt?

## 2019-10-23 NOTE — Telephone Encounter (Signed)
10-18-19 she had a fever of 102. Fever broke that day with Tylenol. Fever has come back today of 100.8 Coughing more than usual. Coughing so hard at times she has some vomiting. Has not been exposed to Covid that she knows of. Rest of the family is not sick. She is going to go to the [facility] on [address].

## 2019-10-23 NOTE — Discharge Instructions (Addendum)
Go directly to [facility] for your chest x-ray.  Take Tylenol as needed for fever or discomfort.    Your urine culture is pending.    Your rapid COVID test is negative; the send-out test is pending.  You should self quarantine until your test result is back and is negative.    Go to the emergency department if you develop high fever, shortness of breath, severe diarrhea, or other concerning symptoms.

## 2019-10-23 NOTE — ED Triage Notes (Signed)
Pt c/o cough, intermittent fever (100.8). shortness of breath and fatigue. Started yesterday. Denies urinary symptoms.

## 2019-10-24 ENCOUNTER — Other Ambulatory Visit: Payer: Self-pay | Admitting: Cardiovascular Disease

## 2019-10-24 LAB — NOVEL CORONAVIRUS, NAA: SARS-CoV-2, NAA: NOT DETECTED

## 2019-10-24 NOTE — Telephone Encounter (Signed)
Prescription refill request for Eliquis received.  Last office visit: 11/18/2020[name] Scr: 0.98, 08/13/2019 Age: 86 y.o. Weight: 83.5 kg   Prescription refill sent.

## 2019-10-24 NOTE — Telephone Encounter (Signed)
Refill request for Eliquis

## 2019-10-25 LAB — URINE CULTURE: Culture: NO GROWTH

## 2019-10-27 ENCOUNTER — Telehealth: Payer: Self-pay | Admitting: Internal Medicine

## 2019-10-27 NOTE — Telephone Encounter (Signed)
Patient stated she was sent the UC on the 1/28 for cough,chills and temp  She stated she is feeling much better but does still have a persistent cough  Patient said she was advise to call our office today to see if she needed to follow up with our office. Patient is coming in on 2/15 for her annual medicare visit.   Patient also stated that she is still waiting on results from a urine test she had done while at the [facility]

## 2019-10-28 NOTE — Telephone Encounter (Signed)
If she is feeling better then she does not need to be seen. I will see her at her annual wellness visit.

## 2019-10-29 NOTE — Telephone Encounter (Signed)
Patient returned call, advised of message below. She stated she is feeling better.   Patient said she hasnt heard from anyone about her covid test. So she wanted to make sure this was negative. Patient also wanted to make sure you are aware she will be getting her covid vaccine on Friday

## 2019-10-29 NOTE — Telephone Encounter (Signed)
Pt is aware of results and will bring the card with the manufacturer of vaccine with her to her appt

## 2019-11-10 ENCOUNTER — Other Ambulatory Visit: Payer: Self-pay

## 2019-11-10 ENCOUNTER — Encounter: Payer: Self-pay | Admitting: Internal Medicine

## 2019-11-10 ENCOUNTER — Telehealth: Payer: Self-pay | Admitting: Internal Medicine

## 2019-11-10 ENCOUNTER — Ambulatory Visit (INDEPENDENT_AMBULATORY_CARE_PROVIDER_SITE_OTHER): Payer: Medicare Other | Admitting: Internal Medicine

## 2019-11-10 VITALS — BP 110/70 | HR 69 | Temp 96.7°F | Wt 184.0 lb

## 2019-11-10 DIAGNOSIS — I480 Paroxysmal atrial fibrillation: Secondary | ICD-10-CM

## 2019-11-10 DIAGNOSIS — E782 Mixed hyperlipidemia: Secondary | ICD-10-CM | POA: Diagnosis not present

## 2019-11-10 DIAGNOSIS — K219 Gastro-esophageal reflux disease without esophagitis: Secondary | ICD-10-CM

## 2019-11-10 DIAGNOSIS — F39 Unspecified mood [affective] disorder: Secondary | ICD-10-CM

## 2019-11-10 DIAGNOSIS — I10 Essential (primary) hypertension: Secondary | ICD-10-CM | POA: Diagnosis not present

## 2019-11-10 DIAGNOSIS — I2510 Atherosclerotic heart disease of native coronary artery without angina pectoris: Secondary | ICD-10-CM

## 2019-11-10 DIAGNOSIS — I5032 Chronic diastolic (congestive) heart failure: Secondary | ICD-10-CM | POA: Diagnosis not present

## 2019-11-10 DIAGNOSIS — M1990 Unspecified osteoarthritis, unspecified site: Secondary | ICD-10-CM

## 2019-11-10 DIAGNOSIS — M810 Age-related osteoporosis without current pathological fracture: Secondary | ICD-10-CM | POA: Diagnosis not present

## 2019-11-10 DIAGNOSIS — J4520 Mild intermittent asthma, uncomplicated: Secondary | ICD-10-CM

## 2019-11-10 DIAGNOSIS — Z0000 Encounter for general adult medical examination without abnormal findings: Secondary | ICD-10-CM

## 2019-11-10 LAB — LIPID PANEL
Cholesterol: 158 mg/dL (ref 0–200)
HDL: 56.4 mg/dL (ref >39.00)
LDL Cholesterol: 78 mg/dL (ref 0–99)
NonHDL: 101.22
Total CHOL/HDL Ratio: 3
Triglycerides: 117 mg/dL (ref 0.0–149.0)
VLDL: 23.4 mg/dL (ref 0.0–40.0)

## 2019-11-10 LAB — COMPREHENSIVE METABOLIC PANEL
ALT: 10 U/L (ref 0–35)
AST: 13 U/L (ref 0–37)
Albumin: 4 g/dL (ref 3.5–5.2)
Alkaline Phosphatase: 34 U/L — ABNORMAL LOW (ref 39–117)
BUN: 24 mg/dL — ABNORMAL HIGH (ref 6–23)
CO2: 33 mEq/L — ABNORMAL HIGH (ref 19–32)
Calcium: 9.4 mg/dL (ref 8.4–10.5)
Chloride: 101 mEq/L (ref 96–112)
Creatinine, Ser: 1.12 mg/dL (ref 0.40–1.20)
GFR: 45.66 mL/min — ABNORMAL LOW (ref >60.00)
Glucose, Bld: 62 mg/dL — ABNORMAL LOW (ref 70–99)
Potassium: 4.1 mEq/L (ref 3.5–5.1)
Sodium: 140 mEq/L (ref 135–145)
Total Bilirubin: 0.4 mg/dL (ref 0.2–1.2)
Total Protein: 6.6 g/dL (ref 6.0–8.3)

## 2019-11-10 LAB — CBC
HCT: 37.3 % (ref 36.0–46.0)
Hemoglobin: 12.5 g/dL (ref 12.0–15.0)
MCHC: 33.3 g/dL (ref 30.0–36.0)
MCV: 86.8 fl (ref 78.0–100.0)
Platelets: 239 10*3/uL (ref 150.0–400.0)
RBC: 4.3 Mil/uL (ref 3.87–5.11)
RDW: 13.7 % (ref 11.5–15.5)
WBC: 4.4 10*3/uL (ref 4.0–10.5)

## 2019-11-10 LAB — VITAMIN D 25 HYDROXY (VIT D DEFICIENCY, FRACTURES): VITD: 64.45 ng/mL (ref 30.00–100.00)

## 2019-11-10 NOTE — Telephone Encounter (Signed)
Patient called to update from appointment this morning. States she takes Omeprazole 20 mg for acid reflux and her Ortho dr is Dr. Cristy Folks.

## 2019-11-10 NOTE — Assessment & Plan Note (Signed)
Continue Advair 

## 2019-11-10 NOTE — Assessment & Plan Note (Signed)
Vit D level today Continue Alendronate Encouraged daily weight bearing exercise

## 2019-11-10 NOTE — Progress Notes (Signed)
HPI:  Pt presents to the clinic today for her subsequent annual Medicare Wellness Exam. She is also due to follow up chronic conditions.  Arthritis: Mainly in her hands, wrists and back. She take  Tylenol and Gabapentin as needed with good relief of symptoms.   Asthma: Mild, persistent. She reports intermittent cough and SOB. She uses Advair daily with good control of symptoms. PFT's from 09/2015 reviewed. She follows with Dr. Mortimer Fries.  GERD: She is currently taking Gaviscon OTC with some relief of symptoms. There is no upper GI on file.   HTN: Her BP today is 110/70. She is taking Diltiazem, Metoprolol and Furosemide as prescribed. ECG from 07/2019 reviewed.  HLD with CAD: She denies recent angina. Her last LDL was 73, 09/2018. She denies myalgias on Simvastatin. She consumes a low fat diet.  Osteoporosis: She is taking Alendronate as prescribed. She denies jaw pain, recent falls. Bone density from 04/2016 reviewed.  Afib: Paroxysmal. She is taking Diltiazem, Metoprolol and Eliquis as prescribed. She follows with Dr. Rockey Situ.  Mood Disorder: Mild anxiety. She feels this is well controlled on Escitalopram. She does not see a therapist. She denies SI/HI.  CHF, Diastolic: Compensated. She denies chronic cough but does have some mild SOB. She is taking Furosemide and Metoprolol as prescribed. Echo from 01/2018 reviewed. She follows with Dr. Rockey Situ.    Past Medical History:  Diagnosis Date  . Allergy   . Arthritis   . Asthma   . Basal cell carcinoma of skin   . Chicken pox   . Diastolic dysfunction    a. TTE 5/19: EF 60-65%, mild focal basal hypertrophy, no RWMA, Gr1DD, mod dilated LA, RV cavity size nl with nl RVSF, PASP 40-45, mildly dilated IVC  . GERD (gastroesophageal reflux disease)   . Hiatal hernia    a. noted on Chest CT 03/2017.  Marland Kitchen History of cardiovascular stress test    a. 01/2018 MV: No ischemia. Fixed antsept, apical inferior defects - likely 2/2 GI uptake artifact (attenuation  corrected images not available). Low risk.  Marland Kitchen History of kidney stones   . PAF (paroxysmal atrial fibrillation) ([place])    a. diagnosed 5/19; b. CHADS2VASc = 4 (age x 2, vascular disease - noted on CT chest 7/18, female)--> Eliquis; c. 01/2018 Zio Monitor: 14% Afib.  Marland Kitchen Postural dizziness with presyncope    a. TTE 8/18: EF 55-60%, nl WM, Gr1DD, mild MR, mildly dilated LA  . Pulmonary embolism on left [facility])    a. in the setting of right TKA; b. treated with Coumadin  . Pulmonary nodules    a. 03/2017 Chest CT: resolution of RUL ground glass nodule. Stable, numerous small bilat pulm nodules since 2016-->benign.    Current Outpatient Medications  Medication Sig Dispense Refill  . acetaminophen (TYLENOL) 500 MG tablet Take 500 mg by mouth every 4 (four) hours as needed for mild pain.     Marland Kitchen albuterol (VENTOLIN HFA) 108 (90 Base) MCG/ACT inhaler Inhale 2 puffs into the lungs every 4 (four) hours as needed for wheezing or shortness of breath. 18 g 0  . alendronate (FOSAMAX) 70 MG tablet TAKE 1 TABLET EVERY 7 DAYS WITH A FULL GLASS OF WATER ON AN EMPTY STOMACH 12 tablet 0  . Cholecalciferol (VITAMIN D) 2000 units CAPS Take 1 capsule by mouth daily.    Marland Kitchen docusate sodium (COLACE) 100 MG capsule Take 100-200 mg by mouth 2 (two) times daily. Take 200 mg by mouth in the morning and 100 mg by  mouth at bedtime.    Marland Kitchen ELIQUIS 5 MG TABS tablet TAKE 1 TABLET TWICE A DAY 180 tablet 1  . escitalopram (LEXAPRO) 5 MG tablet TAKE 1 TABLET DAILY 90 tablet 0  . fexofenadine (ALLEGRA) 180 MG tablet Take 180 mg by mouth daily.    . fluticasone-salmeterol (ADVAIR HFA) 115-21 MCG/ACT inhaler USE 2 INHALATIONS TWICE A DAY 36 g 0  . furosemide (LASIX) 20 MG tablet TAKE 1 TABLET DAILY 90 tablet 3  . gabapentin (NEURONTIN) 300 MG capsule Take 300 mg by mouth 2 (two) times daily.     . Ginger, Zingiber officinalis, (GINGER EXTRACT) 250 MG CAPS Take 250 mg by mouth daily.     Donnie Aho (GLUCOSAMINE MSM  COMPLEX) TABS Take 1 tablet by mouth 2 (two) times daily.    Marland Kitchen guaiFENesin-codeine (ROBITUSSIN AC) 100-10 MG/5ML syrup Take 5 mLs by mouth 3 (three) times daily as needed for cough. Caution of sedation 100 mL 0  . metoprolol tartrate (LOPRESSOR) 25 MG tablet Take 0.5 tablets (12.5 mg total) by mouth 2 (two) times daily. 90 tablet 3  . Misc Natural Products (TART CHERRY ADVANCED) CAPS Take 1 capsule by mouth 2 (two) times daily.     . Multiple Vitamins-Minerals (ONE DAILY MULTIVITAMIN WOMEN PO) Take 1 tablet by mouth daily.    . polyethylene glycol (MIRALAX / GLYCOLAX) packet Take 17 g by mouth daily as needed for mild constipation. 14 each 0  . simvastatin (ZOCOR) 20 MG tablet Take 1 tablet (20 mg total) by mouth daily. *NEEDS OFFICE VISIT FOR FURTHER REFILLS-PLEASE CALL [phone] TO SCHEDULE.* 30 tablet 0  . TURMERIC PO Take 300 mg by mouth at bedtime.      No current facility-administered medications for this visit.    Allergies  Allergen Reactions  . Tramadol Other (See Comments)    Pt states she had a syncopal episode and confusion with Elev BP.     Family History  Problem Relation Age of Onset  . Heart disease Father   . Stroke Maternal Uncle   . Heart disease Maternal Aunt   . Cancer Neg Hx   . Diabetes Neg Hx     Social History   Socioeconomic History  . Marital status: Widowed    Spouse name: Not on file  . Number of children: Not on file  . Years of education: Not on file  . Highest education level: Not on file  Occupational History  . Not on file  Tobacco Use  . Smoking status: Never Smoker  . Smokeless tobacco: Never Used  Substance and Sexual Activity  . Alcohol use: Yes    Alcohol/week: 0.0 standard drinks    Comment: rare--wine  . Drug use: No  . Sexual activity: Not Currently  Other Topics Concern  . Not on file  Social History Narrative  . Not on file   Social Determinants of Health   Financial Resource Strain:   . Difficulty of Paying Living  Expenses: Not on file  Food Insecurity:   . Worried About Charity fundraiser in the Last Year: Not on file  . Ran Out of Food in the Last Year: Not on file  Transportation Needs:   . Lack of Transportation (Medical): Not on file  . Lack of Transportation (Non-Medical): Not on file  Physical Activity:   . Days of Exercise per Week: Not on file  . Minutes of Exercise per Session: Not on file  Stress:   . Feeling of Stress :  Not on file  Social Connections:   . Frequency of Communication with Friends and Family: Not on file  . Frequency of Social Gatherings with Friends and Family: Not on file  . Attends Religious Services: Not on file  . Active Member of Clubs or Organizations: Not on file  . Attends Archivist Meetings: Not on file  . Marital Status: Not on file  Intimate Partner Violence:   . Fear of Current or Ex-Partner: Not on file  . Emotionally Abused: Not on file  . Physically Abused: Not on file  . Sexually Abused: Not on file    Hospitiliaztions: 09/2019- ER visit for URI  Health Maintenance:    Flu: 05/2019  Tetanus: 10/2009  Pneumovax: 11/2015  Prevnar: 03/2013  Zostavax: 2012 in [place]  Shingrix: never  COVID: 10/2019  Mammogram: no longer screening  Pap Smear: no longer screening  Bone Density: 04/2016  Colon Screening: no longer screening  Eye Doctor: annually  Dental Exam: as needed, dentures   Providers:   PCP: Webb Silversmith, NP-C  Cardiologist: Dr. Rockey Situ  Pulmonologist: Dr. Mortimer Fries  Gastroenterologist: Dr. Bailey Mech   I have personally reviewed and have noted:  1. The patient's medical and social history 2. Their use of alcohol, tobacco or illicit drugs 3. Their current medications and supplements 4. The patient's functional ability including ADL's, fall risks, home safety risks and hearing or visual impairment. 5. Diet and physical activities 6. Evidence for depression or mood disorder  Subjective:   Review of Systems:   Constitutional:  Denies fever, malaise, fatigue, headache or abrupt weight changes.  HEENT: Denies eye pain, eye redness, ear pain, ringing in the ears, wax buildup, runny nose, nasal congestion, bloody nose, or sore throat. Respiratory: Pt reports intermittent cough and SOB. Denies difficulty breathing, cough or sputum production.   Cardiovascular: Denies chest pain, chest tightness, palpitations or swelling in the hands or feet.  Gastrointestinal: Pt reports intermittent reflux. Denies abdominal pain, bloating, constipation, diarrhea or blood in the stool.  GU: Denies urgency, frequency, pain with urination, burning sensation, blood in urine, odor or discharge. Musculoskeletal: Pt reports joint pain. Denies decrease in range of motion, difficulty with gait, muscle pain or joint swelling.  Skin: Denies redness, rashes, lesions or ulcercations.  Neurological: Pt reports intermittent lightheadedness. Denies dizziness, difficulty with memory, difficulty with speech or problems with balance and coordination.  Psych: Pt has a history of anxiety. Denies depression, SI/HI.  No other specific complaints in a complete review of systems (except as listed in HPI above).  Objective:  PE:   BP 110/70   Pulse 69   Temp (!) 96.7 F (35.9 C) (Temporal)   Wt 184 lb (83.5 kg)   SpO2 98%   BMI 28.82 kg/m   Wt Readings from Last 3 Encounters:  10/23/19 184 lb (83.5 kg)  09/26/19 183 lb (83 kg)  08/13/19 180 lb (81.6 kg)    General: Appears her stated age, well developed, well nourished in NAD. Skin: Warm, dry and intact.  HEENT: Head: normal shape and size; Eyes: sclera white, no icterus, conjunctiva pink, PERRLA and EOMs intact;  Neck: Neck supple, trachea midline. No masses, lumps or thyromegaly present.  Cardiovascular: Normal rate with irregular rhythm. S1,S2 noted.  No murmur, rubs or gallops noted. No JVD or BLE edema. No carotid bruits noted. Pulmonary/Chest: Normal effort and positive vesicular breath  sounds. No respiratory distress. No wheezes, rales or ronchi noted.  Abdomen: Soft and nontender. Normal bowel sounds. No  distention or masses noted. Liver, spleen and kidneys non palpable. Musculoskeletal: Strength 5/5 BUE/BLE. Gait slow and steady with use of cane.  Neurological: Alert and oriented. Cranial nerves II-XII grossly intact. Coordination normal.  Psychiatric: Mood and affect normal. Behavior is normal. Judgment and thought content normal.     BMET    Component Value Date/Time   NA 144 08/13/2019 0941   K 4.8 08/13/2019 0941   CL 104 08/13/2019 0941   CO2 23 08/13/2019 0941   GLUCOSE 95 08/13/2019 0941   GLUCOSE 89 10/16/2018 1445   BUN 17 08/13/2019 0941   CREATININE 0.98 08/13/2019 0941   CALCIUM 9.2 08/13/2019 0941   GFRNONAA 51 (L) 08/13/2019 0941   GFRAA 59 (L) 08/13/2019 0941    Lipid Panel     Component Value Date/Time   CHOL 151 10/16/2018 1445   TRIG 91.0 10/16/2018 1445   HDL 59.60 10/16/2018 1445   CHOLHDL 3 10/16/2018 1445   VLDL 18.2 10/16/2018 1445   LDLCALC 73 10/16/2018 1445    CBC    Component Value Date/Time   WBC 5.1 08/13/2019 0941   WBC 5.1 10/16/2018 1445   RBC 4.44 08/13/2019 0941   RBC 4.68 10/16/2018 1445   HGB 12.8 08/13/2019 0941   HCT 38.3 08/13/2019 0941   PLT 222 08/13/2019 0941   MCV 86 08/13/2019 0941   MCH 28.8 08/13/2019 0941   MCH 29.7 01/31/2018 0627   MCHC 33.4 08/13/2019 0941   MCHC 33.3 10/16/2018 1445   RDW 12.8 08/13/2019 0941   LYMPHSABS 1.8 04/12/2018 1459   MONOABS 0.9 01/28/2018 1224   EOSABS 0.2 04/12/2018 1459   BASOSABS 0.1 04/12/2018 1459    Hgb A1C Lab Results  Component Value Date   HGBA1C 5.6 01/31/2018      Assessment and Plan:   Medicare Annual Wellness Visit:  Diet:  She does eat some meat. She consumes more veggies than fruits. She tries to avoid fried foods. She drinks mostly coffee, tea, water and milk. Physical activity: None Depression/mood screen: Negative, PHQ 9 score of  0 Hearing: Intact to whispered voice Visual acuity: Grossly normal, performs annual eye exam  ADLs: Capable with cane. Fall risk: Moderate, 1 fall in the last year. Home safety: Good Cognitive evaluation: Intact to orientation, naming, recall and repetition EOL planning: Adv directives, full code/ I agree  Preventative Medicine: Flu, pneumovax, prevnar and zostovax UTD. She declines tetanus for financial reasons (insurance does not cover). She declines shingrix. She has gotten her 1st Covid vaccine, scheduled for her 2nd. She no longer needs to perform pap smears, mammograms, colonoscopy. Bone density due next year. Encouraged her to consume a balanced diet and exercise regimen. Advised her to see an eye doctor and dentist annually. Will check CBC, CMET, Lipid and Vit D today. Due dates for screening exams given to pt as part of her AVS.   Next appointment: 1 year, Medicare Wellness Exam   Webb Silversmith, NP This visit occurred during the SARS-CoV-2 public health emergency.  Safety protocols were in place, including screening questions prior to the visit, additional usage of staff PPE, and extensive cleaning of exam room while observing appropriate contact time as indicated for disinfecting solutions.

## 2019-11-10 NOTE — Assessment & Plan Note (Signed)
Controlled on Diltiazem, Metoprolol and Furosemide.  CMET today

## 2019-11-10 NOTE — Patient Instructions (Signed)

## 2019-11-10 NOTE — Assessment & Plan Note (Signed)
Compensated Continue Metoprolol and Furosemide Monitor weights  CMET today

## 2019-11-10 NOTE — Assessment & Plan Note (Signed)
CMET and Lipid profile today Encouraged her to consume a low fat diet Will need refill of Simvastatin pending labs

## 2019-11-10 NOTE — Assessment & Plan Note (Signed)
Continue Escitalopram Support offered today

## 2019-11-10 NOTE — Assessment & Plan Note (Signed)
She will call me with what other OTC medication she is taking so that I can recommend something better for her reflux She may continue Gaviscon as needed CBC and CMET today

## 2019-11-10 NOTE — Assessment & Plan Note (Signed)
Intermittent angina Controlled on Metoprolol, Eliquis and Simvastatin

## 2019-11-10 NOTE — Telephone Encounter (Signed)
Can we please send in RX for Omeprazole 40 mg #90, 3 refills. She should let us know if this does not help her reflux.

## 2019-11-10 NOTE — Assessment & Plan Note (Signed)
Continue Diltiazem, Metoprolol and Eliquis

## 2019-11-10 NOTE — Assessment & Plan Note (Signed)
Continue Tylenol and Gabapentin

## 2019-11-11 MED ORDER — OMEPRAZOLE 40 MG PO CPDR: 40.0000 mg | DELAYED_RELEASE_CAPSULE | Freq: Every day | ORAL | 2 refills | Status: DC

## 2019-11-11 NOTE — Telephone Encounter (Signed)
Left detailed msg on VM per HIPAA Rx sent through [facility]

## 2019-11-11 NOTE — Addendum Note (Signed)
Addended by: Lurlean Nanny on: 11/11/2019 02:18 PM   Modules accepted: Orders

## 2019-11-12 ENCOUNTER — Telehealth: Payer: Self-pay | Admitting: Internal Medicine

## 2019-11-12 NOTE — Telephone Encounter (Signed)
[facility] called to get a refill on the patient's ALBUTEROL INHALER. The rep stated they sent over a requesting on 2/11 but did not hear anything back from [place] and wanted to follow up on request.,  They stated the patient is requesting a 90 day supply with 3 refills if possible    [facility]- [phone]

## 2019-11-19 DIAGNOSIS — M5416 Radiculopathy, lumbar region: Secondary | ICD-10-CM | POA: Diagnosis not present

## 2019-11-19 DIAGNOSIS — M25519 Pain in unspecified shoulder: Secondary | ICD-10-CM | POA: Diagnosis not present

## 2019-11-19 MED ORDER — SIMVASTATIN 20 MG PO TABS: 20.0000 mg | ORAL_TABLET | Freq: Every day | ORAL | 2 refills | Status: DC

## 2019-11-19 NOTE — Addendum Note (Signed)
Addended by: Lurlean Nanny on: 11/19/2019 11:11 AM   Modules accepted: Orders

## 2019-12-15 ENCOUNTER — Other Ambulatory Visit: Payer: Self-pay | Admitting: Internal Medicine

## 2019-12-16 ENCOUNTER — Other Ambulatory Visit: Payer: Self-pay | Admitting: Internal Medicine

## 2019-12-16 DIAGNOSIS — M810 Age-related osteoporosis without current pathological fracture: Secondary | ICD-10-CM

## 2019-12-31 DIAGNOSIS — Z79899 Other long term (current) drug therapy: Secondary | ICD-10-CM | POA: Diagnosis not present

## 2019-12-31 DIAGNOSIS — R0602 Shortness of breath: Secondary | ICD-10-CM | POA: Diagnosis not present

## 2019-12-31 DIAGNOSIS — Z7901 Long term (current) use of anticoagulants: Secondary | ICD-10-CM | POA: Diagnosis not present

## 2019-12-31 DIAGNOSIS — R778 Other specified abnormalities of plasma proteins: Secondary | ICD-10-CM | POA: Diagnosis present

## 2019-12-31 DIAGNOSIS — R739 Hyperglycemia, unspecified: Secondary | ICD-10-CM | POA: Diagnosis present

## 2019-12-31 DIAGNOSIS — Z20822 Contact with and (suspected) exposure to covid-19: Secondary | ICD-10-CM | POA: Diagnosis not present

## 2019-12-31 DIAGNOSIS — I4891 Unspecified atrial fibrillation: Secondary | ICD-10-CM | POA: Diagnosis not present

## 2019-12-31 DIAGNOSIS — D72825 Bandemia: Secondary | ICD-10-CM | POA: Diagnosis not present

## 2019-12-31 DIAGNOSIS — J159 Unspecified bacterial pneumonia: Secondary | ICD-10-CM | POA: Diagnosis not present

## 2019-12-31 DIAGNOSIS — A419 Sepsis, unspecified organism: Secondary | ICD-10-CM | POA: Diagnosis not present

## 2019-12-31 DIAGNOSIS — Z743 Need for continuous supervision: Secondary | ICD-10-CM | POA: Diagnosis not present

## 2019-12-31 DIAGNOSIS — R05 Cough: Secondary | ICD-10-CM | POA: Diagnosis not present

## 2019-12-31 DIAGNOSIS — E872 Acidosis: Secondary | ICD-10-CM | POA: Diagnosis present

## 2019-12-31 DIAGNOSIS — R030 Elevated blood-pressure reading, without diagnosis of hypertension: Secondary | ICD-10-CM | POA: Diagnosis not present

## 2019-12-31 DIAGNOSIS — R918 Other nonspecific abnormal finding of lung field: Secondary | ICD-10-CM | POA: Diagnosis not present

## 2019-12-31 DIAGNOSIS — J45901 Unspecified asthma with (acute) exacerbation: Secondary | ICD-10-CM | POA: Diagnosis not present

## 2019-12-31 DIAGNOSIS — J9601 Acute respiratory failure with hypoxia: Secondary | ICD-10-CM | POA: Diagnosis not present

## 2019-12-31 DIAGNOSIS — Z7951 Long term (current) use of inhaled steroids: Secondary | ICD-10-CM | POA: Diagnosis not present

## 2019-12-31 DIAGNOSIS — J189 Pneumonia, unspecified organism: Secondary | ICD-10-CM | POA: Diagnosis not present

## 2020-01-05 ENCOUNTER — Telehealth: Payer: Self-pay | Admitting: Internal Medicine

## 2020-01-05 ENCOUNTER — Telehealth: Payer: Self-pay | Admitting: Cardiovascular Disease

## 2020-01-05 NOTE — Telephone Encounter (Signed)
Left voicemail message to please request discharge summary and follow up records to be faxed to [place] and to please give [place] a call back and wished safe travels back home.

## 2020-01-05 NOTE — Telephone Encounter (Signed)
Patient calling to make Julie Davies that she has been seen in the ED while staying in [place] with her Family.  She was d/c home yesterday 01/04/20, seen 4/7-4/11 for PNA and Afib.  Pt has been followed by her Son's PCP [name], DO.  Just wanted to make Webb Silversmith, NP aware of this recent hospitalization.

## 2020-01-05 NOTE — Telephone Encounter (Signed)
No f/u

## 2020-01-05 NOTE — Telephone Encounter (Signed)
noted 

## 2020-01-05 NOTE — Telephone Encounter (Signed)
Will send to Dr. Mortimer Fries to make him aware.

## 2020-01-05 NOTE — Telephone Encounter (Signed)
Patient calling to let Rockey Situ know she was admitted in [place] for pneumonia afib .  Patient is doing fu there as well with Dr. [name] .  Per Patient request faxed to hospital and office to get notes from this episode.     Patient will not be back for 2 months .

## 2020-01-06 DIAGNOSIS — R05 Cough: Secondary | ICD-10-CM | POA: Diagnosis not present

## 2020-01-06 DIAGNOSIS — J189 Pneumonia, unspecified organism: Secondary | ICD-10-CM | POA: Diagnosis not present

## 2020-01-06 DIAGNOSIS — Z09 Encounter for follow-up examination after completed treatment for conditions other than malignant neoplasm: Secondary | ICD-10-CM | POA: Diagnosis not present

## 2020-01-06 DIAGNOSIS — I4891 Unspecified atrial fibrillation: Secondary | ICD-10-CM | POA: Diagnosis not present

## 2020-01-09 NOTE — Telephone Encounter (Signed)
Left voicemail message that I was calling to check in and request discharge summary and follow up records to be sent to [place] here for when she returns.

## 2020-01-11 ENCOUNTER — Other Ambulatory Visit: Payer: Self-pay | Admitting: Internal Medicine

## 2020-02-12 DIAGNOSIS — K2210 Ulcer of esophagus without bleeding: Secondary | ICD-10-CM | POA: Diagnosis not present

## 2020-02-12 DIAGNOSIS — I959 Hypotension, unspecified: Secondary | ICD-10-CM | POA: Diagnosis present

## 2020-02-12 DIAGNOSIS — J189 Pneumonia, unspecified organism: Secondary | ICD-10-CM | POA: Diagnosis not present

## 2020-02-12 DIAGNOSIS — Z7901 Long term (current) use of anticoagulants: Secondary | ICD-10-CM | POA: Diagnosis not present

## 2020-02-12 DIAGNOSIS — Z7951 Long term (current) use of inhaled steroids: Secondary | ICD-10-CM | POA: Diagnosis not present

## 2020-02-12 DIAGNOSIS — I480 Paroxysmal atrial fibrillation: Secondary | ICD-10-CM | POA: Diagnosis not present

## 2020-02-12 DIAGNOSIS — I350 Nonrheumatic aortic (valve) stenosis: Secondary | ICD-10-CM | POA: Diagnosis present

## 2020-02-12 DIAGNOSIS — I447 Left bundle-branch block, unspecified: Secondary | ICD-10-CM | POA: Diagnosis not present

## 2020-02-12 DIAGNOSIS — F17200 Nicotine dependence, unspecified, uncomplicated: Secondary | ICD-10-CM | POA: Diagnosis present

## 2020-02-12 DIAGNOSIS — I129 Hypertensive chronic kidney disease with stage 1 through stage 4 chronic kidney disease, or unspecified chronic kidney disease: Secondary | ICD-10-CM | POA: Diagnosis present

## 2020-02-12 DIAGNOSIS — I4891 Unspecified atrial fibrillation: Secondary | ICD-10-CM | POA: Diagnosis not present

## 2020-02-12 DIAGNOSIS — Z952 Presence of prosthetic heart valve: Secondary | ICD-10-CM | POA: Diagnosis not present

## 2020-02-12 DIAGNOSIS — Z90411 Acquired partial absence of pancreas: Secondary | ICD-10-CM | POA: Diagnosis not present

## 2020-02-12 DIAGNOSIS — I517 Cardiomegaly: Secondary | ICD-10-CM | POA: Diagnosis not present

## 2020-02-12 DIAGNOSIS — I491 Atrial premature depolarization: Secondary | ICD-10-CM | POA: Diagnosis not present

## 2020-02-12 DIAGNOSIS — K219 Gastro-esophageal reflux disease without esophagitis: Secondary | ICD-10-CM | POA: Diagnosis present

## 2020-02-12 DIAGNOSIS — D638 Anemia in other chronic diseases classified elsewhere: Secondary | ICD-10-CM | POA: Diagnosis present

## 2020-02-12 DIAGNOSIS — R0600 Dyspnea, unspecified: Secondary | ICD-10-CM | POA: Diagnosis not present

## 2020-02-12 DIAGNOSIS — Z96659 Presence of unspecified artificial knee joint: Secondary | ICD-10-CM | POA: Diagnosis present

## 2020-02-12 DIAGNOSIS — R918 Other nonspecific abnormal finding of lung field: Secondary | ICD-10-CM | POA: Diagnosis not present

## 2020-02-12 DIAGNOSIS — Z743 Need for continuous supervision: Secondary | ICD-10-CM | POA: Diagnosis not present

## 2020-02-12 DIAGNOSIS — Z20822 Contact with and (suspected) exposure to covid-19: Secondary | ICD-10-CM | POA: Diagnosis present

## 2020-02-12 DIAGNOSIS — I351 Nonrheumatic aortic (valve) insufficiency: Secondary | ICD-10-CM | POA: Diagnosis not present

## 2020-02-12 DIAGNOSIS — J440 Chronic obstructive pulmonary disease with acute lower respiratory infection: Secondary | ICD-10-CM | POA: Diagnosis not present

## 2020-02-12 DIAGNOSIS — I4892 Unspecified atrial flutter: Secondary | ICD-10-CM | POA: Diagnosis not present

## 2020-02-12 DIAGNOSIS — K5900 Constipation, unspecified: Secondary | ICD-10-CM | POA: Diagnosis present

## 2020-02-12 DIAGNOSIS — I340 Nonrheumatic mitral (valve) insufficiency: Secondary | ICD-10-CM | POA: Diagnosis not present

## 2020-02-12 DIAGNOSIS — R0602 Shortness of breath: Secondary | ICD-10-CM | POA: Diagnosis not present

## 2020-02-24 ENCOUNTER — Ambulatory Visit (INDEPENDENT_AMBULATORY_CARE_PROVIDER_SITE_OTHER): Payer: Medicare Other | Admitting: Internal Medicine

## 2020-02-24 ENCOUNTER — Other Ambulatory Visit: Payer: Self-pay

## 2020-02-24 ENCOUNTER — Ambulatory Visit (INDEPENDENT_AMBULATORY_CARE_PROVIDER_SITE_OTHER)
Admission: RE | Admit: 2020-02-24 | Discharge: 2020-02-24 | Disposition: A | Discharge status: Home / Self Care | Payer: Medicare Other | Source: Ambulatory Visit | Attending: Internal Medicine | Admitting: Internal Medicine

## 2020-02-24 ENCOUNTER — Encounter: Payer: Self-pay | Admitting: Internal Medicine

## 2020-02-24 VITALS — BP 118/78 | HR 66 | Temp 97.3°F | Wt 191.0 lb

## 2020-02-24 DIAGNOSIS — J90 Pleural effusion, not elsewhere classified: Secondary | ICD-10-CM | POA: Diagnosis not present

## 2020-02-24 DIAGNOSIS — J189 Pneumonia, unspecified organism: Secondary | ICD-10-CM | POA: Diagnosis not present

## 2020-02-24 DIAGNOSIS — I2510 Atherosclerotic heart disease of native coronary artery without angina pectoris: Secondary | ICD-10-CM

## 2020-02-24 DIAGNOSIS — R531 Weakness: Secondary | ICD-10-CM

## 2020-02-24 DIAGNOSIS — I4811 Longstanding persistent atrial fibrillation: Secondary | ICD-10-CM

## 2020-02-24 LAB — CBC
HCT: 34.9 % — ABNORMAL LOW (ref 36.0–46.0)
Hemoglobin: 11.6 g/dL — ABNORMAL LOW (ref 12.0–15.0)
MCHC: 33.2 g/dL (ref 30.0–36.0)
MCV: 87.7 fl (ref 78.0–100.0)
Platelets: 345 10*3/uL (ref 150.0–400.0)
RBC: 3.98 Mil/uL (ref 3.87–5.11)
RDW: 14.9 % (ref 11.5–15.5)
WBC: 4.1 10*3/uL (ref 4.0–10.5)

## 2020-02-24 LAB — COMPREHENSIVE METABOLIC PANEL
ALT: 20 U/L (ref 0–35)
AST: 17 U/L (ref 0–37)
Albumin: 4 g/dL (ref 3.5–5.2)
Alkaline Phosphatase: 41 U/L (ref 39–117)
BUN: 9 mg/dL (ref 6–23)
CO2: 30 mEq/L (ref 19–32)
Calcium: 9 mg/dL (ref 8.4–10.5)
Chloride: 103 mEq/L (ref 96–112)
Creatinine, Ser: 0.94 mg/dL (ref 0.40–1.20)
GFR: 55.85 mL/min — ABNORMAL LOW (ref >60.00)
Glucose, Bld: 90 mg/dL (ref 70–99)
Potassium: 4 mEq/L (ref 3.5–5.1)
Sodium: 139 mEq/L (ref 135–145)
Total Bilirubin: 0.3 mg/dL (ref 0.2–1.2)
Total Protein: 6.7 g/dL (ref 6.0–8.3)

## 2020-02-24 LAB — TSH: TSH: 2.57 u[IU]/mL (ref 0.35–4.50)

## 2020-02-24 MED ORDER — BENZONATATE 200 MG PO CAPS: 200.0000 mg | ORAL_CAPSULE | Freq: Three times a day (TID) | ORAL | 1 refills | Status: DC | PRN

## 2020-02-24 MED ORDER — ADVAIR HFA 115-21 MCG/ACT IN AERO: INHALATION_SPRAY | RESPIRATORY_TRACT | 0 refills | Status: DC

## 2020-02-27 ENCOUNTER — Telehealth: Payer: Self-pay | Admitting: Internal Medicine

## 2020-02-27 NOTE — Patient Instructions (Signed)
Community-Acquired Pneumonia, Adult Pneumonia is an infection of the lungs. It causes swelling in the airways of the lungs. Mucus and fluid may also build up inside the airways. One type of pneumonia can happen while a person is in a hospital. A different type can happen when a person is not in a hospital (community-acquired pneumonia).  What are the causes?  This condition is caused by germs (viruses, bacteria, or fungi). Some types of germs can be passed from one person to another. This can happen when you breathe in droplets from the cough or sneeze of an infected person. What increases the risk? You are more likely to develop this condition if you:  Have a long-term (chronic) disease, such as: ? Chronic obstructive pulmonary disease (COPD). ? Asthma. ? Cystic fibrosis. ? Congestive heart failure. ? Diabetes. ? Kidney disease.  Have HIV.  Have sickle cell disease.  Have had your spleen removed.  Do not take good care of your teeth and mouth (poor dental hygiene).  Have a medical condition that increases the risk of breathing in droplets from your own mouth and nose.  Have a weakened body defense system (immune system).  Are a smoker.  Travel to areas where the germs that cause this illness are common.  Are around certain animals or the places they live. What are the signs or symptoms?  A dry cough.  A wet (productive) cough.  Fever.  Sweating.  Chest pain. This often happens when breathing deeply or coughing.  Fast breathing or trouble breathing.  Shortness of breath.  Shaking chills.  Feeling tired (fatigue).  Muscle aches. How is this treated? Treatment for this condition depends on many things. Most adults can be treated at home. In some cases, treatment must happen in a hospital. Treatment may include:  Medicines given by mouth or through an IV tube.  Being given extra oxygen.  Respiratory therapy. In rare cases, treatment for very bad pneumonia  may include:  Using a machine to help you breathe.  Having a procedure to remove fluid from around your lungs. Follow these instructions at home: Medicines  Take over-the-counter and prescription medicines only as told by your doctor. ? Only take cough medicine if you are losing sleep.  If you were prescribed an antibiotic medicine, take it as told by your doctor. Do not stop taking the antibiotic even if you start to feel better. General instructions   Sleep with your head and neck raised (elevated). You can do this by sleeping in a recliner or by putting a few pillows under your head.  Rest as needed. Get at least 8 hours of sleep each night.  Drink enough water to keep your pee (urine) pale yellow.  Eat a healthy diet that includes plenty of vegetables, fruits, whole grains, low-fat dairy products, and lean protein.  Do not use any products that contain nicotine or tobacco. These include cigarettes, e-cigarettes, and chewing tobacco. If you need help quitting, ask your doctor.  Keep all follow-up visits as told by your doctor. This is important. How is this prevented? A shot (vaccine) can help prevent pneumonia. Shots are often suggested for:  People older than 84 years of age.  People older than 84 years of age who: ? Are having cancer treatment. ? Have long-term (chronic) lung disease. ? Have problems with their body's defense system. You may also prevent pneumonia if you take these actions:  Get the flu (influenza) shot every year.  Go to the dentist as   often as told.  Wash your hands often. If you cannot use soap and water, use hand sanitizer. Contact a doctor if:  You have a fever.  You lose sleep because your cough medicine does not help. Get help right away if:  You are short of breath and it gets worse.  You have more chest pain.  Your sickness gets worse. This is very serious if: ? You are an older adult. ? Your body's defense system is weak.  You  cough up blood. Summary  Pneumonia is an infection of the lungs.  Most adults can be treated at home. Some will need treatment in a hospital.  Drink enough water to keep your pee pale yellow.  Get at least 8 hours of sleep each night. This information is not intended to replace advice given to you by your health care provider. Make sure you discuss any questions you have with your health care provider. Document Revised: 01/01/2019 Document Reviewed: 05/09/2018 Elsevier Patient Education  2020 [employer]  

## 2020-02-27 NOTE — Telephone Encounter (Signed)
Patient returned call .   She stated that she is only taking Lasix 20 mg. 1 tablet daily.

## 2020-02-27 NOTE — Progress Notes (Signed)
Subjective:    Patient ID: Julie Davies, female    DOB: 1926-12-28, 86 y.o.   MRN: [id number]  HPI  Pt presents to the clinic today for hospital follow up. She went to the ER in [place] with c/o cough, SOB and weakness. She was diagnosed with RLL pneumonia,Afib with RVR, admitted from 4/7-4/11. She was discharged and was feeling fine until 5/20 when she presented to the ER again with cough, SOB and weakness. Chest xray showed bilateral pneumonia, ECG showed Afib with RVR. She was started on a Cardizem drip, 2D echo and cardiology was consulted. She was given Rocephin and Azithromycin. She was discharged on 5/25, flew back to [facility] on 5/29. Since discharge, she reports persistent fatigue, weakness, poor appetite, slight cough and some SOB. She denies fever, chills or body aches. She was not sent home with any antibiotics and this concerns her. She has a follow up appt with cardiology next week.  Review of Systems      Past Medical History:  Diagnosis Date  . Allergy   . Arthritis   . Asthma   . Basal cell carcinoma of skin   . Chicken pox   . Diastolic dysfunction    a. TTE 5/19: EF 60-65%, mild focal basal hypertrophy, no RWMA, Gr1DD, mod dilated LA, RV cavity size nl with nl RVSF, PASP 40-45, mildly dilated IVC  . GERD (gastroesophageal reflux disease)   . Hiatal hernia    a. noted on Chest CT 03/2017.  Marland Kitchen History of cardiovascular stress test    a. 01/2018 MV: No ischemia. Fixed antsept, apical inferior defects - likely 2/2 GI uptake artifact (attenuation corrected images not available). Low risk.  Marland Kitchen History of kidney stones   . PAF (paroxysmal atrial fibrillation) ([place])    a. diagnosed 5/19; b. CHADS2VASc = 4 (age x 2, vascular disease - noted on CT chest 7/18, female)--> Eliquis; c. 01/2018 Zio Monitor: 14% Afib.  Marland Kitchen Postural dizziness with presyncope    a. TTE 8/18: EF 55-60%, nl WM, Gr1DD, mild MR, mildly dilated LA  . Pulmonary embolism on left [facility])    a. in the setting of  right TKA; b. treated with Coumadin  . Pulmonary nodules    a. 03/2017 Chest CT: resolution of RUL ground glass nodule. Stable, numerous small bilat pulm nodules since 2016-->benign.    Current Outpatient Medications  Medication Sig Dispense Refill  . acetaminophen (TYLENOL) 500 MG tablet Take 500 mg by mouth every 4 (four) hours as needed for mild pain.     Marland Kitchen albuterol (VENTOLIN HFA) 108 (90 Base) MCG/ACT inhaler Inhale 2 puffs into the lungs every 4 (four) hours as needed for wheezing or shortness of breath. 18 g 0  . alendronate (FOSAMAX) 70 MG tablet TAKE 1 TABLET EVERY 7 DAYS WITH A FULL GLASS OF WATER ON AN EMPTY STOMACH 12 tablet 3  . amiodarone (PACERONE) 200 MG tablet Take 200 mg by mouth 2 (two) times daily.    . benzonatate (TESSALON) 200 MG capsule Take 1 capsule (200 mg total) by mouth 3 (three) times daily as needed for cough. 30 capsule 1  . Cholecalciferol (VITAMIN D) 2000 units CAPS Take 1 capsule by mouth daily.    Marland Kitchen diltiazem (CARDIZEM) 90 MG tablet Take 90 mg by mouth 3 (three) times daily.     Marland Kitchen docusate sodium (COLACE) 100 MG capsule Take 100-200 mg by mouth 2 (two) times daily. Take 200 mg by mouth in the morning and 100  mg by mouth at bedtime.    Marland Kitchen ELIQUIS 5 MG TABS tablet TAKE 1 TABLET TWICE A DAY 180 tablet 1  . escitalopram (LEXAPRO) 5 MG tablet TAKE 1 TABLET DAILY 90 tablet 2  . fexofenadine (ALLEGRA) 180 MG tablet Take 180 mg by mouth daily.    . fluticasone-salmeterol (ADVAIR HFA) 115-21 MCG/ACT inhaler USE 2 INHALATIONS TWICE A DAY 36 g 0  . furosemide (LASIX) 20 MG tablet TAKE 1 TABLET DAILY 90 tablet 3  . gabapentin (NEURONTIN) 300 MG capsule Take 300 mg by mouth 2 (two) times daily.     . Ginger, Zingiber officinalis, (GINGER EXTRACT) 250 MG CAPS Take 250 mg by mouth daily.     Donnie Aho (GLUCOSAMINE MSM COMPLEX) TABS Take 1 tablet by mouth 2 (two) times daily.    . Misc Natural Products (TART CHERRY ADVANCED) CAPS Take 1 capsule by mouth  2 (two) times daily.     . Multiple Vitamins-Minerals (ONE DAILY MULTIVITAMIN WOMEN PO) Take 1 tablet by mouth daily.    Marland Kitchen omeprazole (PRILOSEC) 40 MG capsule Take 1 capsule (40 mg total) by mouth daily. 90 capsule 2  . polyethylene glycol (MIRALAX / GLYCOLAX) packet Take 17 g by mouth daily as needed for mild constipation. 14 each 0  . simvastatin (ZOCOR) 20 MG tablet Take 1 tablet (20 mg total) by mouth daily. 90 tablet 2  . TURMERIC PO Take 300 mg by mouth at bedtime.      No current facility-administered medications for this visit.    Allergies  Allergen Reactions  . Tramadol Other (See Comments)    Pt states she had a syncopal episode and confusion with Elev BP.     Family History  Problem Relation Age of Onset  . Heart disease Father   . Stroke Maternal Uncle   . Heart disease Maternal Aunt   . Cancer Neg Hx   . Diabetes Neg Hx     Social History   Socioeconomic History  . Marital status: Widowed    Spouse name: Not on file  . Number of children: Not on file  . Years of education: Not on file  . Highest education level: Not on file  Occupational History  . Not on file  Tobacco Use  . Smoking status: Never Smoker  . Smokeless tobacco: Never Used  Substance and Sexual Activity  . Alcohol use: Yes    Alcohol/week: 0.0 standard drinks    Comment: rare--wine  . Drug use: No  . Sexual activity: Not Currently  Other Topics Concern  . Not on file  Social History Narrative  . Not on file   Social Determinants of Health   Financial Resource Strain:   . Difficulty of Paying Living Expenses:   Food Insecurity:   . Worried About Charity fundraiser in the Last Year:   . Arboriculturist in the Last Year:   Transportation Needs:   . Film/video editor (Medical):   Marland Kitchen Lack of Transportation (Non-Medical):   Physical Activity:   . Days of Exercise per Week:   . Minutes of Exercise per Session:   Stress:   . Feeling of Stress :   Social Connections:   .  Frequency of Communication with Friends and Family:   . Frequency of Social Gatherings with Friends and Family:   . Attends Religious Services:   . Active Member of Clubs or Organizations:   . Attends Archivist Meetings:   Marland Kitchen Marital  Status:   Intimate Partner Violence:   . Fear of Current or Ex-Partner:   . Emotionally Abused:   Marland Kitchen Physically Abused:   . Sexually Abused:      Constitutional: Pt reports fatigue, weakness. Denies fever, malaise, headache or abrupt weight changes.  HEENT: Denies eye pain, eye redness, ear pain, ringing in the ears, wax buildup, runny nose, nasal congestion, bloody nose, or sore throat. Respiratory: Pt reports cough and SOB. Denies difficulty breathing, or sputum production.   Cardiovascular: Denies chest pain, chest tightness, palpitations or swelling in the hands or feet.  Gastrointestinal: Pt reports poor appetite. Denies abdominal pain, bloating, constipation, diarrhea or blood in the stool.   No other specific complaints in a complete review of systems (except as listed in HPI above).  Objective:   Physical Exam   BP 118/78   Pulse 66   Temp (!) 97.3 F (36.3 C) (Temporal)   Wt 191 lb (86.6 kg)   SpO2 97%   BMI 29.91 kg/m  Wt Readings from Last 3 Encounters:  02/24/20 191 lb (86.6 kg)  11/10/19 184 lb (83.5 kg)  10/23/19 184 lb (83.5 kg)    General: Appears her stated age, in NAD. Skin: Warm, dry and intact. No rashesnoted. HEENT: Head: normal shape and size: Teeth present, mucosa pink and moist, no exudate, lesions or ulcerations noted.  Neck:  Neck supple, trachea midline. No masses, lumps or thyromegaly present.  Cardiovascular: Normal rate, irregular rhythm. S1,S2 noted.  No murmur, rubs or gallops noted. No JVD or BLE edema. Pulmonary/Chest: Normal effort and positive vesicular breath sounds. No respiratory distress. No wheezes, rales or ronchi noted.  Abdomen: Soft and nontender. Normal bowel sounds.  Musculoskeletal:  Gait slow and steady with assistive device. Neurological: Alert and oriented.    BMET    Component Value Date/Time   NA 139 02/24/2020 1035   NA 144 08/13/2019 0941   K 4.0 02/24/2020 1035   CL 103 02/24/2020 1035   CO2 30 02/24/2020 1035   GLUCOSE 90 02/24/2020 1035   BUN 9 02/24/2020 1035   BUN 17 08/13/2019 0941   CREATININE 0.94 02/24/2020 1035   CALCIUM 9.0 02/24/2020 1035   GFRNONAA 51 (L) 08/13/2019 0941   GFRAA 59 (L) 08/13/2019 0941    Lipid Panel     Component Value Date/Time   CHOL 158 11/10/2019 1002   TRIG 117.0 11/10/2019 1002   HDL 56.40 11/10/2019 1002   CHOLHDL 3 11/10/2019 1002   VLDL 23.4 11/10/2019 1002   LDLCALC 78 11/10/2019 1002    CBC    Component Value Date/Time   WBC 4.1 02/24/2020 1035   RBC 3.98 02/24/2020 1035   HGB 11.6 (L) 02/24/2020 1035   HGB 12.8 08/13/2019 0941   HCT 34.9 (L) 02/24/2020 1035   HCT 38.3 08/13/2019 0941   PLT 345.0 02/24/2020 1035   PLT 222 08/13/2019 0941   MCV 87.7 02/24/2020 1035   MCV 86 08/13/2019 0941   MCH 28.8 08/13/2019 0941   MCH 29.7 01/31/2018 0627   MCHC 33.2 02/24/2020 1035   RDW 14.9 02/24/2020 1035   RDW 12.8 08/13/2019 0941   LYMPHSABS 1.8 04/12/2018 1459   MONOABS 0.9 01/28/2018 1224   EOSABS 0.2 04/12/2018 1459   BASOSABS 0.1 04/12/2018 1459    Hgb A1C Lab Results  Component Value Date   HGBA1C 5.6 01/31/2018           Assessment & Plan:   Hospital Follow Up for Bilateral Pneumonia,  Afib with RVR, Weakness:  Hospital notes, labs and imaging reviewed Repeat chest xray today Will hold off on additional abx pending chest xray Daughter request referral for [facility] care management- referrals placed Will check CBC, CMET and TSH today Refilled Advair and Tessalon Follow up with cardiology as scheduled  Will follow up after labs, return precautions discussed Webb Silversmith, NP This visit occurred during the SARS-CoV-2 public health emergency.  Safety protocols were  in place, including screening questions prior to the visit, additional usage of staff PPE, and extensive cleaning of exam room while observing appropriate contact time as indicated for disinfecting solutions.

## 2020-02-29 DIAGNOSIS — K219 Gastro-esophageal reflux disease without esophagitis: Secondary | ICD-10-CM | POA: Diagnosis not present

## 2020-02-29 DIAGNOSIS — Z86711 Personal history of pulmonary embolism: Secondary | ICD-10-CM | POA: Diagnosis not present

## 2020-02-29 DIAGNOSIS — I5030 Unspecified diastolic (congestive) heart failure: Secondary | ICD-10-CM | POA: Diagnosis not present

## 2020-02-29 DIAGNOSIS — R918 Other nonspecific abnormal finding of lung field: Secondary | ICD-10-CM | POA: Diagnosis not present

## 2020-02-29 DIAGNOSIS — I480 Paroxysmal atrial fibrillation: Secondary | ICD-10-CM | POA: Diagnosis not present

## 2020-02-29 DIAGNOSIS — I110 Hypertensive heart disease with heart failure: Secondary | ICD-10-CM | POA: Diagnosis not present

## 2020-02-29 DIAGNOSIS — Z87442 Personal history of urinary calculi: Secondary | ICD-10-CM | POA: Diagnosis not present

## 2020-02-29 DIAGNOSIS — K449 Diaphragmatic hernia without obstruction or gangrene: Secondary | ICD-10-CM | POA: Diagnosis not present

## 2020-02-29 DIAGNOSIS — Z85828 Personal history of other malignant neoplasm of skin: Secondary | ICD-10-CM | POA: Diagnosis not present

## 2020-02-29 DIAGNOSIS — M1990 Unspecified osteoarthritis, unspecified site: Secondary | ICD-10-CM | POA: Diagnosis not present

## 2020-02-29 DIAGNOSIS — Z7901 Long term (current) use of anticoagulants: Secondary | ICD-10-CM | POA: Diagnosis not present

## 2020-02-29 DIAGNOSIS — R42 Dizziness and giddiness: Secondary | ICD-10-CM | POA: Diagnosis not present

## 2020-02-29 DIAGNOSIS — J449 Chronic obstructive pulmonary disease, unspecified: Secondary | ICD-10-CM | POA: Diagnosis not present

## 2020-02-29 DIAGNOSIS — I4811 Longstanding persistent atrial fibrillation: Secondary | ICD-10-CM | POA: Diagnosis not present

## 2020-02-29 DIAGNOSIS — J181 Lobar pneumonia, unspecified organism: Secondary | ICD-10-CM | POA: Diagnosis not present

## 2020-02-29 DIAGNOSIS — Z9181 History of falling: Secondary | ICD-10-CM | POA: Diagnosis not present

## 2020-03-01 ENCOUNTER — Other Ambulatory Visit: Payer: Self-pay

## 2020-03-01 ENCOUNTER — Telehealth: Payer: Self-pay | Admitting: Internal Medicine

## 2020-03-01 ENCOUNTER — Encounter: Payer: Self-pay | Admitting: Family

## 2020-03-01 ENCOUNTER — Telehealth: Payer: Self-pay

## 2020-03-01 ENCOUNTER — Ambulatory Visit (INDEPENDENT_AMBULATORY_CARE_PROVIDER_SITE_OTHER): Payer: Medicare Other | Admitting: Family

## 2020-03-01 VITALS — BP 110/62 | HR 70 | Ht 67.0 in | Wt 182.2 lb

## 2020-03-01 DIAGNOSIS — I10 Essential (primary) hypertension: Secondary | ICD-10-CM

## 2020-03-01 DIAGNOSIS — Z7901 Long term (current) use of anticoagulants: Secondary | ICD-10-CM

## 2020-03-01 DIAGNOSIS — I480 Paroxysmal atrial fibrillation: Secondary | ICD-10-CM

## 2020-03-01 DIAGNOSIS — R42 Dizziness and giddiness: Secondary | ICD-10-CM

## 2020-03-01 DIAGNOSIS — I5032 Chronic diastolic (congestive) heart failure: Secondary | ICD-10-CM

## 2020-03-01 MED ORDER — DILTIAZEM HCL 60 MG PO TABS: 60.0000 mg | ORAL_TABLET | Freq: Three times a day (TID) | ORAL | 2 refills | Status: DC

## 2020-03-01 MED ORDER — AMIODARONE HCL 200 MG PO TABS: 200.0000 mg | ORAL_TABLET | Freq: Every day | ORAL | 1 refills | Status: DC

## 2020-03-01 NOTE — Progress Notes (Signed)
Office Visit    Patient Name: Julie Davies Date of Encounter: 03/01/2020  Primary Care Provider:  Jearld Fenton, NP Primary Cardiologist:  Ida Rogue, MD Electrophysiologist:  None   Chief Complaint    Julie Davies is a 86 y.o. female with a hx of PAF on anticoagulation and Amiodarone, COPD, asthma, benign pulmonary nodules, presyncope/orthostasis, diastolic dysfunction, pulmonary embolism 2003 presents today for follow up of PAF.  Past Medical History    Past Medical History:  Diagnosis Date  . Allergy   . Arthritis   . Asthma   . Basal cell carcinoma of skin   . Chicken pox   . Diastolic dysfunction    a. TTE 5/19: EF 60-65%, mild focal basal hypertrophy, no RWMA, Gr1DD, mod dilated LA, RV cavity size nl with nl RVSF, PASP 40-45, mildly dilated IVC  . GERD (gastroesophageal reflux disease)   . Hiatal hernia    a. noted on Chest CT 03/2017.  Marland Kitchen History of cardiovascular stress test    a. 01/2018 MV: No ischemia. Fixed antsept, apical inferior defects - likely 2/2 GI uptake artifact (attenuation corrected images not available). Low risk.  Marland Kitchen History of kidney stones   . PAF (paroxysmal atrial fibrillation) ([place])    a. diagnosed 5/19; b. CHADS2VASc = 4 (age x 2, vascular disease - noted on CT chest 7/18, female)--> Eliquis; c. 01/2018 Zio Monitor: 14% Afib.  Marland Kitchen Postural dizziness with presyncope    a. TTE 8/18: EF 55-60%, nl WM, Gr1DD, mild MR, mildly dilated LA  . Pulmonary embolism on left [facility])    a. in the setting of right TKA; b. treated with Coumadin  . Pulmonary nodules    a. 03/2017 Chest CT: resolution of RUL ground glass nodule. Stable, numerous small bilat pulm nodules since 2016-->benign.   Past Surgical History:  Procedure Laterality Date  . APPENDECTOMY  1935  . DILATION AND CURETTAGE OF UTERUS  1975  . NEPHRECTOMY Right 1975  . REPLACEMENT TOTAL KNEE BILATERAL  2013  . VEIN LIGATION AND STRIPPING Left 1971    Allergies  Allergies  Allergen  Reactions  . Tramadol Other (See Comments)    Pt states she had a syncopal episode and confusion with Elev BP.     History of Present Illness    Julie Davies is a 86 y.o. female with a hx of PAF on anticoagulation and Amiodarone, COPD, asthma, benign pulmonary nodules, presyncope/orthostasis, diastolic dysfunction, pulmonary embolism 2003 following right TKA. She was last seen 08/13/19 by Dr. Rockey Situ.  August 2018 evaluated for presyncope. Echo with normal LV function and was found to be orthostatic. Stress testing 05/2017 showed breast attenuation without ischemia. May 2019 admitted to [facility] with COPD exacerbation and new onset atrial fib with RVR. Echo with normal LVEF with gr1DD and moderately elevated PASP 40-48mmHg. Due to CHADS2VASc of 4, anticoagulated with Eliquis and placed on beta blocker and CCB. May 2019 noted palpitation and chest pressure. Repeat stress testing was nonischemic. 2-week ZIO with 14% atrial fib burden, longest spell lasting 14h15min with average 94 bpm. Chest pressure was associated with episodes of atrial fib.   Hospitalized 12/31/19-01/04/20 for right sided pneumonia with episode of atrial fibrillation with RVR. She was admitted to [facility] in [place] 02/12/20-02/17/20. Presented to the ED with shortness of breath and weakness. Again in atrial fibrillation with HR 116 bpm. Her diltiazem was increased, Amiodarone and Eliquis continued. PNA treated with IV abx. COPD exacerbation treated with nebulizers.   Present today with her  daughter. Reports dyspnea on exertion, lower extremity edema, fatigue, and lightheadedness. Plans to start working with [facility] PT. Reports home systolic blood pressure routinely 100-110. Very rare hypertensive readings. Reports compliance with all of her medications. Takes diltiazem approximately 8 hours apart. No orthopnea, PND. Did have one episode were she felt her atrial fibrillation since hospital discharge which felt like a "discomfort" in her chest and  self resolved.   EKGs/Labs/Other Studies Reviewed:   The following studies were reviewed today:  CT CHEST / THORAX WITHOUT CONTRAST 02/12/2020 - Patchy bilateral urinary consolidations, mainly inferiorly, consistent with pneumonia. - Calcific atherosclerosis of the coronary arteries and thoracic aorta.   ECHOCARDIOGRAM 02/12/2020 - Left ventricle wall thickness ws increased in a pattern of mild LVH. Systolic function was normal. Ejection fraction was 60-65%.  - Aortic Valve with mild regurgitation.  - Mitral Valve with mild regurgitation.  - Left Atrium is moderately dilated.  - Right Atrium cavity size is mildly dilated.  - Pulmonary Valve peak systolic gradient is 4 mm Hg.  - Pulmonary Arteries peak pressure during systole by doppler is 37 mm Hg.   EKG:  EKG is ordered today.  The ekg ordered today demonstrates NSR 70 bpm with LBBB.   Recent Labs: 02/24/2020: ALT 20; BUN 9; Creatinine, Ser 0.94; Hemoglobin 11.6; Platelets 345.0; Potassium 4.0; Sodium 139; TSH 2.57  Recent Lipid Panel    Component Value Date/Time   CHOL 158 11/10/2019 1002   TRIG 117.0 11/10/2019 1002   HDL 56.40 11/10/2019 1002   CHOLHDL 3 11/10/2019 1002   VLDL 23.4 11/10/2019 1002   LDLCALC 78 11/10/2019 1002    Home Medications   Current Meds  Medication Sig  . acetaminophen (TYLENOL) 500 MG tablet Take 500 mg by mouth every 4 (four) hours as needed for mild pain.   Marland Kitchen albuterol (VENTOLIN HFA) 108 (90 Base) MCG/ACT inhaler Inhale 2 puffs into the lungs every 4 (four) hours as needed for wheezing or shortness of breath.  Marland Kitchen alendronate (FOSAMAX) 70 MG tablet TAKE 1 TABLET EVERY 7 DAYS WITH A FULL GLASS OF WATER ON AN EMPTY STOMACH  . amiodarone (PACERONE) 200 MG tablet Take 1 tablet (200 mg total) by mouth daily.  . benzonatate (TESSALON) 200 MG capsule Take 1 capsule (200 mg total) by mouth 3 (three) times daily as needed for cough.  . Cholecalciferol (VITAMIN D) 2000 units CAPS Take 1 capsule by mouth  daily.  Marland Kitchen docusate sodium (COLACE) 100 MG capsule Take 100-200 mg by mouth 2 (two) times daily. Take 200 mg by mouth in the morning and 100 mg by mouth at bedtime.  Marland Kitchen ELIQUIS 5 MG TABS tablet TAKE 1 TABLET TWICE A DAY  . escitalopram (LEXAPRO) 5 MG tablet TAKE 1 TABLET DAILY  . fexofenadine (ALLEGRA) 180 MG tablet Take 180 mg by mouth daily.  . fluticasone-salmeterol (ADVAIR HFA) 115-21 MCG/ACT inhaler USE 2 INHALATIONS TWICE A DAY  . furosemide (LASIX) 20 MG tablet TAKE 1 TABLET DAILY  . gabapentin (NEURONTIN) 300 MG capsule Take 300 mg by mouth 2 (two) times daily.   Donnie Aho (GLUCOSAMINE MSM COMPLEX) TABS Take 1 tablet by mouth 2 (two) times daily.  . Misc Natural Products (TART CHERRY ADVANCED) CAPS Take 1 capsule by mouth 2 (two) times daily.   . Multiple Vitamins-Minerals (ONE DAILY MULTIVITAMIN WOMEN PO) Take 1 tablet by mouth daily.  Marland Kitchen omeprazole (PRILOSEC) 40 MG capsule Take 1 capsule (40 mg total) by mouth daily.  . polyethylene glycol (MIRALAX /  GLYCOLAX) packet Take 17 g by mouth daily as needed for mild constipation.  . simvastatin (ZOCOR) 20 MG tablet Take 1 tablet (20 mg total) by mouth daily.  . TURMERIC PO Take 300 mg by mouth at bedtime.   . [DISCONTINUED] amiodarone (PACERONE) 200 MG tablet Take 200 mg by mouth 2 (two) times daily.  . [DISCONTINUED] diltiazem (CARDIZEM) 90 MG tablet Take 90 mg by mouth 3 (three) times daily.     Review of Systems      Review of Systems  Constitution: Positive for malaise/fatigue. Negative for chills and fever.  Cardiovascular: Positive for dyspnea on exertion and leg swelling. Negative for chest pain, irregular heartbeat, near-syncope, orthopnea, palpitations and syncope.  Respiratory: Negative for cough, shortness of breath and wheezing.   Gastrointestinal: Negative for melena, nausea and vomiting.  Genitourinary: Negative for hematuria.  Neurological: Negative for dizziness, light-headedness and weakness.   All  other systems reviewed and are otherwise negative except as noted above.  Physical Exam    VS:  BP 110/62 (BP Location: Left Arm, Patient Position: Sitting, Cuff Size: Normal)   Pulse 70   Ht 5\' 7"  (1.702 m)   Wt 182 lb 4 oz (82.7 kg)   SpO2 96%   BMI 28.54 kg/m  , BMI Body mass index is 28.54 kg/m. GEN: Well nourished, overweight, well developed, in no acute distress. HEENT: normal. Neck: Supple, no JVD, carotid bruits, or masses. Cardiac: RRR, no murmurs, rubs, or gallops. No clubbing, cyanosis, edema.  Radials/DP/PT 2+ and equal bilaterally.  Respiratory:  Respirations regular and unlabored, clear to auscultation bilaterally. GI: Soft, nontender, nondistended, BS + x 4. MS: No deformity or atrophy. Skin: Warm and dry, no rash. Neuro:  Strength and sensation are intact. Psych: Normal affect.  Assessment & Plan    1. PAF - EKG today shows she is maintaining NSR today. Reduce Amiodarone to 200mg  daily. Reduce diltiazem, as below. If BP remains low may need to transition back to Metoprolol. Recent liver function and TSH normal, recommend monitoring annually. May benefit from PFT as baseline studies at upcoming follow up with pulmonology.   2. Chronic anticoagulation - Eliquis 5mg  BID due to PAF. Does not meet dose reduction criteria. Denies bleeding complications. Recent improvement in Hb from 10.2 to 11.6.   3. HTN/Orthostatic lightheadedness - Positive orthostatic vitals today.  Reduce diltiazem to 60mg  TID. Encouraged to stay well hydrated, continue to wear compression stockings, make position changes slowly.   4. Chest pressure - Single episode which she attributed to irregular heart rhythm. Self resolved after a few moments. No indication for ischemic evaluation at this time.   5. Anemia secondary to chronic disease - Recent Hb improved from 10.2 to 11.6. Encouraged dietary sources of iron. Educational handout provided.   6. COPD/Asthma - No signs of acute exacerbation.  Continue to follow with pulmonary.  7. Chronic diastolic heart failure - Euvolemic and well compensated. Continue Lasix 20mg  daily.   Disposition: Follow up in 2 month(s) with Dr. Rockey Situ or APP.    Loel Dubonnet, NP 03/01/2020, 11:44 AM

## 2020-03-01 NOTE — Telephone Encounter (Signed)
Julie Davies with [facility] called requesting verbal orders  She would like Od[place] orders  1 time a week for 1 week  1 time a week for 8 weeks 2 prn   Tori also stated that she wanted to request orders that it was okay for a nurse to go in to see patient   Please advise   C/B # [phone]

## 2020-03-01 NOTE — Patient Instructions (Addendum)
Medication Instructions:  Your physician has recommended you make the following change in your medication:   REDUCE Amiodarone to 200mg  daily  REDUCE Diltiazem (Cardizem) to 60mg  three times per day  *If you need a refill on your cardiac medications before your next appointment, please call your pharmacy*  Lab Work: No lab work today.   If you have labs (blood work) drawn today and your tests are completely normal, you will receive your results only by: Marland Kitchen MyChart Message (if you have MyChart) OR . A paper copy in the mail If you have any lab test that is abnormal or we need to change your treatment, we will call you to review the results.   Testing/Procedures: Your EKG today shows normal sinus rhythm.   Follow-Up: At [facility], you and your health needs are our priority.  As part of our continuing mission to provide you with exceptional heart care, we have created designated Provider Care Teams.  These Care Teams include your primary Cardiologist (physician) and Advanced Practice Providers (APPs -  Physician Assistants and Nurse Practitioners) who all work together to provide you with the care you need, when you need it.  We recommend signing up for the patient portal called "MyChart".  Sign up information is provided on this After Visit Summary.  MyChart is used to connect with patients for Virtual Visits (Telemedicine).  Patients are able to view lab/test results, encounter notes, upcoming appointments, etc.  Non-urgent messages can be sent to your provider as well.   To learn more about what you can do with MyChart, go to [email or web address].    Your next appointment:   2 month(s)  The format for your next appointment:   In Person  Provider:  You may see Ida Rogue, MD or one of the following Advanced Practice Providers on your designated Care Team:    Murray Hodgkins, NP  Christell Faith, PA-C  Marrianne Mood, PA-C  Laurann Montana, NP  Other  Instructions  Iron-Rich Diet  Iron is a mineral that helps your body to produce hemoglobin. Hemoglobin is a protein in red blood cells that carries oxygen to your body's tissues. Eating too little iron may cause you to feel weak and tired, and it can increase your risk of infection. Iron is naturally found in many foods, and many foods have iron added to them (iron-fortified foods). You may need to follow an iron-rich diet if you do not have enough iron in your body due to certain medical conditions. The amount of iron that you need each day depends on your age, your sex, and any medical conditions you have. Follow instructions from your health care provider or a diet and nutrition specialist (dietitian) about how much iron you should eat each day. What are tips for following this plan? Reading food labels  Check food labels to see how many milligrams (mg) of iron are in each serving. Cooking  Cook foods in pots and pans that are made from iron.  Take these steps to make it easier for your body to absorb iron from certain foods: ? Soak beans overnight before cooking. ? Soak whole grains overnight and drain them before using. ? Ferment flours before baking, such as by using yeast in bread dough. Meal planning  When you eat foods that contain iron, you should eat them with foods that are high in vitamin C. These include oranges, peppers, tomatoes, potatoes, and mango. Vitamin C helps your body to absorb iron. General information  Take  iron supplements only as told by your health care provider. An overdose of iron can be life-threatening. If you were prescribed iron supplements, take them with orange juice or a vitamin C supplement.  When you eat iron-fortified foods or take an iron supplement, you should also eat foods that naturally contain iron, such as meat, poultry, and fish. Eating naturally iron-rich foods helps your body to absorb the iron that is added to other foods or contained in a  supplement.  Certain foods and drinks prevent your body from absorbing iron properly. Avoid eating these foods in the same meal as iron-rich foods or with iron supplements. These foods include: ? Coffee, black tea, and red wine. ? Milk, dairy products, and foods that are high in calcium. ? Beans and soybeans. ? Whole grains. What foods should I eat? Fruits Prunes. Raisins. Eat fruits high in vitamin C, such as oranges, grapefruits, and strawberries, alongside iron-rich foods. Vegetables Spinach (cooked). Green peas. Broccoli. Fermented vegetables. Eat vegetables high in vitamin C, such as leafy greens, potatoes, bell peppers, and tomatoes, alongside iron-rich foods. Grains Iron-fortified breakfast cereal. Iron-fortified whole-wheat bread. Enriched rice. Sprouted grains. Meats and other proteins Beef liver. Oysters. Beef. Shrimp. [place]. Chicken. [place]. Sardines. Chickpeas. Nuts. Tofu. Pumpkin seeds. Beverages Tomato juice. Fresh orange juice. Prune juice. Hibiscus tea. Fortified instant breakfast shakes. Sweets and desserts Blackstrap molasses. Seasonings and condiments Tahini. Fermented soy sauce. Other foods Wheat germ. The items listed above may not be a complete list of recommended foods and beverages. Contact a dietitian for more information. What foods should I avoid? Grains Whole grains. Bran cereal. Bran flour. Oats. Meats and other proteins Soybeans. Products made from soy protein. Black beans. Lentils. Mung beans. Split peas. Dairy Milk. Cream. Cheese. Yogurt. Cottage cheese. Beverages Coffee. Black tea. Red wine. Sweets and desserts Cocoa. Chocolate. Ice cream. Other foods Basil. Oregano. Large amounts of parsley. The items listed above may not be a complete list of foods and beverages to avoid. Contact a dietitian for more information. Summary  Iron is a mineral that helps your body to produce hemoglobin. Hemoglobin is a protein in red blood cells that carries  oxygen to your body's tissues.  Iron is naturally found in many foods, and many foods have iron added to them (iron-fortified foods).  When you eat foods that contain iron, you should eat them with foods that are high in vitamin C. Vitamin C helps your body to absorb iron.  Certain foods and drinks prevent your body from absorbing iron properly, such as whole grains and dairy products. You should avoid eating these foods in the same meal as iron-rich foods or with iron supplements. This information is not intended to replace advice given to you by your health care provider. Make sure you discuss any questions you have with your health care provider. Document Revised: 08/24/2017 Document Reviewed: 08/07/2017 Elsevier Patient Education  2020 [employer].

## 2020-03-01 NOTE — Telephone Encounter (Signed)
Ok for HH orders as requested 

## 2020-03-01 NOTE — Telephone Encounter (Signed)
[place] Night - Client Nonclinical Telephone Record  Access[facility]urse Client [place] [facility] Night - Client Client Site [place] - Night Contact Type Call Who Is Calling Patient / Member / Family / Caregiver Caller Name Nicholes Calamity Phone Number [phone] Patient Name Julie Davies Patient DOB 1927-08-14 Call Type Message Only Information Provided Reason for Call Request for [place] calling for Jinny Sanders, states she just got off the phone with her concerning patient : message: We can do a starter care for nursing on Sunday 02/29/20 Additional Comment Disp. Time Disposition Final User 02/27/2020 5:04:27 PM General Information Provided Yes Kenton Kingfisher, Lanette Call Closed By: Nelia Shi Transaction Date/Time: 02/27/2020 5:00:11 PM (ET)

## 2020-03-03 NOTE — Telephone Encounter (Signed)
Julie Davies with Julie Davies left a voicemail stating that she would appreciate a call back today with verbal orders for home health nursing.

## 2020-03-03 NOTE — Telephone Encounter (Signed)
VO given as instructed  

## 2020-03-04 DIAGNOSIS — I480 Paroxysmal atrial fibrillation: Secondary | ICD-10-CM | POA: Diagnosis not present

## 2020-03-04 DIAGNOSIS — I110 Hypertensive heart disease with heart failure: Secondary | ICD-10-CM | POA: Diagnosis not present

## 2020-03-04 DIAGNOSIS — J449 Chronic obstructive pulmonary disease, unspecified: Secondary | ICD-10-CM | POA: Diagnosis not present

## 2020-03-04 DIAGNOSIS — I5030 Unspecified diastolic (congestive) heart failure: Secondary | ICD-10-CM | POA: Diagnosis not present

## 2020-03-04 DIAGNOSIS — J181 Lobar pneumonia, unspecified organism: Secondary | ICD-10-CM | POA: Diagnosis not present

## 2020-03-04 DIAGNOSIS — I4811 Longstanding persistent atrial fibrillation: Secondary | ICD-10-CM | POA: Diagnosis not present

## 2020-03-04 NOTE — Telephone Encounter (Signed)
Julie Davies with [facility] left v/m stating this is her  3rd request for cb with verbal orders for [facility] nursing  2 x a wk for 1 wk and 1 x a wk for 8 wks and 2 prn visits. Request bc today.

## 2020-03-05 ENCOUNTER — Telehealth: Payer: Self-pay

## 2020-03-05 DIAGNOSIS — I4811 Longstanding persistent atrial fibrillation: Secondary | ICD-10-CM | POA: Diagnosis not present

## 2020-03-05 DIAGNOSIS — I5030 Unspecified diastolic (congestive) heart failure: Secondary | ICD-10-CM | POA: Diagnosis not present

## 2020-03-05 DIAGNOSIS — J449 Chronic obstructive pulmonary disease, unspecified: Secondary | ICD-10-CM | POA: Diagnosis not present

## 2020-03-05 DIAGNOSIS — I110 Hypertensive heart disease with heart failure: Secondary | ICD-10-CM | POA: Diagnosis not present

## 2020-03-05 DIAGNOSIS — I480 Paroxysmal atrial fibrillation: Secondary | ICD-10-CM | POA: Diagnosis not present

## 2020-03-05 DIAGNOSIS — J181 Lobar pneumonia, unspecified organism: Secondary | ICD-10-CM | POA: Diagnosis not present

## 2020-03-05 NOTE — Telephone Encounter (Signed)
[place] for verbal PT orders as requested

## 2020-03-05 NOTE — Telephone Encounter (Signed)
noted 

## 2020-03-05 NOTE — Telephone Encounter (Signed)
I received a call from [place], [place] with [facility].  She states she is getting alerts on her computer when the patient takes Simvastatin and Amiodorone, and well as Simvastatin and Diltiazem together. She states she is unsure how long the patient has been on these meds, and so Rollene Fare may already be aware of these alerts - but she states per her protocol she needs to let [place] know.  FYI.

## 2020-03-05 NOTE — Telephone Encounter (Signed)
I received a call from [place], [place] with advanced home health. He is requesting verbal orders for PT 2x a week for 3 weeks, and 1x a week for 4 weeks.  He can be reached back at [place] for verbal orders?

## 2020-03-08 ENCOUNTER — Ambulatory Visit: Payer: Medicare Other | Admitting: Pulmonary Disease

## 2020-03-08 ENCOUNTER — Telehealth: Payer: Self-pay

## 2020-03-08 ENCOUNTER — Encounter: Payer: Self-pay | Admitting: Pulmonary Disease

## 2020-03-08 ENCOUNTER — Other Ambulatory Visit: Payer: Self-pay

## 2020-03-08 VITALS — BP 120/72 | HR 88 | Temp 97.9°F | Ht 67.0 in | Wt 182.6 lb

## 2020-03-08 DIAGNOSIS — K219 Gastro-esophageal reflux disease without esophagitis: Secondary | ICD-10-CM

## 2020-03-08 DIAGNOSIS — J4530 Mild persistent asthma, uncomplicated: Secondary | ICD-10-CM

## 2020-03-08 NOTE — Telephone Encounter (Signed)
[place] for [facility] PT orders as requested.

## 2020-03-08 NOTE — Telephone Encounter (Signed)
Julie Davies PT with [facility] left v/m requesting verbal orders for [facility] PT 1 x a wk for 1 wk 2 x a wk for 3 wks and 1 x a wk for 4 wks.

## 2020-03-08 NOTE — Progress Notes (Signed)
 Assessment & Plan:  1. Mild persistent asthma without complication (Primary)  2. Gastroesophageal reflux disease, unspecified whether esophagitis present   Patient Instructions  Continue Advair  for now.   We will see him in follow-up in 2 months time call sooner should any new difficulties arise  Please note: late entry documentation due to logistical difficulties during COVID-19 pandemic. This note is filed for information purposes only, and is not intended to be used for billing, nor does it represent the full scope/nature of the visit in question. Please see any associated scanned media linked to date of encounter for additional pertinent information.  Subjective:    HPI: Julie Davies is a 86 y.o. female presenting to the pulmonology clinic on 03/08/2020 with report of: Hospitalization Follow-up (admission 02/12/2020. pt states breathing has slightly worsen since discharge. pt reports of sob with exertion and prod cough with clear to white mucus. )     Outpatient Encounter Medications as of 03/08/2020  Medication Sig   acetaminophen  (TYLENOL ) 500 MG tablet Take 500 mg by mouth every 4 (four) hours as needed for mild pain.    albuterol  (VENTOLIN  HFA) 108 (90 Base) MCG/ACT inhaler Inhale 2 puffs into the lungs every 4 (four) hours as needed for wheezing or shortness of breath.   Cholecalciferol (VITAMIN D ) 2000 units CAPS Take 1 capsule by mouth daily.   docusate sodium (COLACE) 100 MG capsule Take 100-200 mg by mouth 2 (two) times daily. Take 200 mg by mouth in the morning and 100 mg by mouth at bedtime.   fexofenadine (ALLEGRA) 180 MG tablet Take 180 mg by mouth daily.   gabapentin (NEURONTIN) 300 MG capsule Take 300 mg by mouth 2 (two) times daily.    Glucos-MSM-C-Mn-Ginger-Willow (GLUCOSAMINE MSM COMPLEX) TABS Take 1 tablet by mouth daily after lunch.   Misc Natural Products (TART CHERRY ADVANCED) CAPS Take 1 capsule by mouth 2 (two) times daily.    Multiple  Vitamins-Minerals (ONE DAILY MULTIVITAMIN WOMEN PO) Take 1 tablet by mouth daily.   polyethylene glycol (MIRALAX  / GLYCOLAX ) packet Take 17 g by mouth daily as needed for mild constipation.   TURMERIC PO Take 300 mg by mouth at bedtime.    [DISCONTINUED] alendronate  (FOSAMAX ) 70 MG tablet TAKE 1 TABLET EVERY 7 DAYS WITH A FULL GLASS OF WATER ON AN EMPTY STOMACH   [DISCONTINUED] amiodarone  (PACERONE ) 200 MG tablet Take 1 tablet (200 mg total) by mouth daily.   [DISCONTINUED] benzonatate  (TESSALON ) 200 MG capsule Take 1 capsule (200 mg total) by mouth 3 (three) times daily as needed for cough.   [DISCONTINUED] diltiazem  (CARDIZEM ) 60 MG tablet Take 1 tablet (60 mg total) by mouth 3 (three) times daily.   [DISCONTINUED] ELIQUIS  5 MG TABS tablet TAKE 1 TABLET TWICE A DAY   [DISCONTINUED] escitalopram  (LEXAPRO ) 5 MG tablet TAKE 1 TABLET DAILY   [DISCONTINUED] fluticasone -salmeterol (ADVAIR  HFA) 115-21 MCG/ACT inhaler USE 2 INHALATIONS TWICE A DAY   [DISCONTINUED] furosemide  (LASIX ) 20 MG tablet TAKE 1 TABLET DAILY   [DISCONTINUED] omeprazole  (PRILOSEC) 40 MG capsule Take 1 capsule (40 mg total) by mouth daily.   [DISCONTINUED] simvastatin  (ZOCOR ) 20 MG tablet Take 1 tablet (20 mg total) by mouth daily.   No facility-administered encounter medications on file as of 03/08/2020.      Objective:   Vitals:   03/08/20 0908  BP: 120/72  Pulse: 88  Temp: 97.9 F (36.6 C)  Height: 5' 7 (1.702 m)  Weight: 182 lb 9.6 oz (82.8 kg)  SpO2: 98%  TempSrc: Temporal  BMI (Calculated): 28.59     Physical exam documentation is limited by delayed entry of information.

## 2020-03-08 NOTE — Patient Instructions (Signed)
Continue Advair for now.   We will see him in follow-up in 2 months time call sooner should any new difficulties arise

## 2020-03-09 DIAGNOSIS — I110 Hypertensive heart disease with heart failure: Secondary | ICD-10-CM | POA: Diagnosis not present

## 2020-03-09 DIAGNOSIS — J181 Lobar pneumonia, unspecified organism: Secondary | ICD-10-CM | POA: Diagnosis not present

## 2020-03-09 DIAGNOSIS — I480 Paroxysmal atrial fibrillation: Secondary | ICD-10-CM | POA: Diagnosis not present

## 2020-03-09 DIAGNOSIS — I5030 Unspecified diastolic (congestive) heart failure: Secondary | ICD-10-CM | POA: Diagnosis not present

## 2020-03-09 DIAGNOSIS — J449 Chronic obstructive pulmonary disease, unspecified: Secondary | ICD-10-CM | POA: Diagnosis not present

## 2020-03-09 DIAGNOSIS — I4811 Longstanding persistent atrial fibrillation: Secondary | ICD-10-CM | POA: Diagnosis not present

## 2020-03-09 NOTE — Telephone Encounter (Signed)
Jeani Hawking PT with [facility] left v/m requesting cb ASAP on orders requested yesterday so pt will not miss out on any PT appts. I called Jeani Hawking PT at [place] and notified as instructed and Jeani Hawking voiced understanding. Nothing further needed.

## 2020-03-11 DIAGNOSIS — Z7901 Long term (current) use of anticoagulants: Secondary | ICD-10-CM

## 2020-03-11 DIAGNOSIS — Z9181 History of falling: Secondary | ICD-10-CM

## 2020-03-11 DIAGNOSIS — I5030 Unspecified diastolic (congestive) heart failure: Secondary | ICD-10-CM | POA: Diagnosis not present

## 2020-03-11 DIAGNOSIS — J181 Lobar pneumonia, unspecified organism: Secondary | ICD-10-CM | POA: Diagnosis not present

## 2020-03-11 DIAGNOSIS — Z87442 Personal history of urinary calculi: Secondary | ICD-10-CM | POA: Diagnosis not present

## 2020-03-11 DIAGNOSIS — K449 Diaphragmatic hernia without obstruction or gangrene: Secondary | ICD-10-CM | POA: Diagnosis not present

## 2020-03-11 DIAGNOSIS — J449 Chronic obstructive pulmonary disease, unspecified: Secondary | ICD-10-CM | POA: Diagnosis not present

## 2020-03-11 DIAGNOSIS — I110 Hypertensive heart disease with heart failure: Secondary | ICD-10-CM | POA: Diagnosis not present

## 2020-03-11 DIAGNOSIS — I4811 Longstanding persistent atrial fibrillation: Secondary | ICD-10-CM | POA: Diagnosis not present

## 2020-03-11 DIAGNOSIS — R918 Other nonspecific abnormal finding of lung field: Secondary | ICD-10-CM | POA: Diagnosis not present

## 2020-03-11 DIAGNOSIS — R42 Dizziness and giddiness: Secondary | ICD-10-CM | POA: Diagnosis not present

## 2020-03-11 DIAGNOSIS — K219 Gastro-esophageal reflux disease without esophagitis: Secondary | ICD-10-CM | POA: Diagnosis not present

## 2020-03-11 DIAGNOSIS — Z85828 Personal history of other malignant neoplasm of skin: Secondary | ICD-10-CM | POA: Diagnosis not present

## 2020-03-11 DIAGNOSIS — Z86711 Personal history of pulmonary embolism: Secondary | ICD-10-CM

## 2020-03-11 DIAGNOSIS — I480 Paroxysmal atrial fibrillation: Secondary | ICD-10-CM | POA: Diagnosis not present

## 2020-03-11 DIAGNOSIS — M1990 Unspecified osteoarthritis, unspecified site: Secondary | ICD-10-CM | POA: Diagnosis not present

## 2020-03-12 ENCOUNTER — Other Ambulatory Visit: Payer: Self-pay | Admitting: *Deleted

## 2020-03-12 ENCOUNTER — Encounter: Payer: Self-pay | Admitting: *Deleted

## 2020-03-12 NOTE — Patient Outreach (Signed)
[place] [facility]) Care Management  03/12/2020  Julie Davies October 04, 1926 [id number]   Subjective: Telephone call to patient's home / mobile number, spoke with patient, and HIPAA verified.  Discussed [facility] Care Management Medicare follow up, advised [place] Management services are an additional benefit not duplication, does not replace current home health services, patient voiced understanding, and is in agreement to follow up.  Patient states she is doing well, feeling much better, health issues have been straighten out, and she is on the right path.   Patient states she is able to manage self care and has assistance as needed.  Patient voices understanding of medical diagnosis and treatment plan. States she was told by pulmonologist that she does not COPD and only has asthma.  States has hypertension that is well controlled.  Discussed her recent hospitalizations and recovery plan. States she has seen the following providers for hospital follow up appointments and all appointments went well: saw primary provider on 02/24/2020, saw cardiologist on 03/01/2020, saw pulmonologist on 03/08/2020.   States she also has home health physical therapy through [facility] and services are going well.  States she had a recent fall while getting up to go the the bathroom during the night, no significant injuries, has a few bruises, and physical therapist has advised her to use her rolling walker at night for safety.  Discussed patient's questions regarding Medicare coverage of walker and how to obtain, patient voices understanding, states she has an old walker that she purchased out of pocket, is planning to  follow up with her provider to obtain order, and obtain new walker through local durable medical equipment company.  States she is accessing her Medicare benefits as needed via member services number on back of card.  Patient states she does not have any education material, care coordination, care  management, disease monitoring, transportation, community resource, or pharmacy needs at this time.  States she is very appreciative of the follow up, is in agreement  to receive 1 additional follow up call to assess for further Care Management needs, and receive [facility] Care Management information.   States she has this RNCM's and [place] Management contact information, will call if assistance needed prior to next patent outreach.        Objective: Per KPN (Knowledge Performance Now, point of care tool), chart review, patient hospitalized 02/12/2020- 02/17/2020 for pneumonia  and 12/31/2019 - 01/04/2020 for sepsis secondary to pneumonia at [facility], [place] ( was visiting her son).    Patient also has a history of hypertension, COPD, pneumonia, Anemia secondary to chronic disease, Atrial fibrillation with rapid ventricular response, Asthma, Basal cell carcinoma of skin, Diastolic dysfunction, Hiatal hernia, Pulmonary embolism on left, and kidney stones.    Assessment: Received Medicare MD Referral on 02/27/2020.  Referral source: Wayne Both,  Nurse Practitioner.  Referral reason: frequent hospital visit,  Diagnoses of COPD/ Pneumonia.  Screening follow up completed and will follow up to assess for care management needs.      Plan: RNCM will send patient successful outreach letter, [facility] pamphlet, and magnet, per patient's request. RNCM will call patient for telephone outreach attempt, within 30 business days, assess for CM needs.     Julie Davies H. Annia Friendly, BSN, [place] Management [facility] Telephonic CM Phone: [phone] Fax: [phone]

## 2020-03-15 DIAGNOSIS — D2261 Melanocytic nevi of right upper limb, including shoulder: Secondary | ICD-10-CM | POA: Diagnosis not present

## 2020-03-16 DIAGNOSIS — I5030 Unspecified diastolic (congestive) heart failure: Secondary | ICD-10-CM | POA: Diagnosis not present

## 2020-03-16 DIAGNOSIS — I480 Paroxysmal atrial fibrillation: Secondary | ICD-10-CM | POA: Diagnosis not present

## 2020-03-16 DIAGNOSIS — I110 Hypertensive heart disease with heart failure: Secondary | ICD-10-CM | POA: Diagnosis not present

## 2020-03-16 DIAGNOSIS — J181 Lobar pneumonia, unspecified organism: Secondary | ICD-10-CM | POA: Diagnosis not present

## 2020-03-16 DIAGNOSIS — I4811 Longstanding persistent atrial fibrillation: Secondary | ICD-10-CM | POA: Diagnosis not present

## 2020-03-16 DIAGNOSIS — J449 Chronic obstructive pulmonary disease, unspecified: Secondary | ICD-10-CM | POA: Diagnosis not present

## 2020-03-18 DIAGNOSIS — I5030 Unspecified diastolic (congestive) heart failure: Secondary | ICD-10-CM | POA: Diagnosis not present

## 2020-03-18 DIAGNOSIS — J449 Chronic obstructive pulmonary disease, unspecified: Secondary | ICD-10-CM | POA: Diagnosis not present

## 2020-03-18 DIAGNOSIS — I4811 Longstanding persistent atrial fibrillation: Secondary | ICD-10-CM | POA: Diagnosis not present

## 2020-03-18 DIAGNOSIS — I110 Hypertensive heart disease with heart failure: Secondary | ICD-10-CM | POA: Diagnosis not present

## 2020-03-18 DIAGNOSIS — J181 Lobar pneumonia, unspecified organism: Secondary | ICD-10-CM | POA: Diagnosis not present

## 2020-03-18 DIAGNOSIS — I480 Paroxysmal atrial fibrillation: Secondary | ICD-10-CM | POA: Diagnosis not present

## 2020-03-23 ENCOUNTER — Other Ambulatory Visit: Payer: Self-pay

## 2020-03-23 DIAGNOSIS — J181 Lobar pneumonia, unspecified organism: Secondary | ICD-10-CM | POA: Diagnosis not present

## 2020-03-23 DIAGNOSIS — I480 Paroxysmal atrial fibrillation: Secondary | ICD-10-CM | POA: Diagnosis not present

## 2020-03-23 DIAGNOSIS — I4811 Longstanding persistent atrial fibrillation: Secondary | ICD-10-CM | POA: Diagnosis not present

## 2020-03-23 DIAGNOSIS — I5030 Unspecified diastolic (congestive) heart failure: Secondary | ICD-10-CM | POA: Diagnosis not present

## 2020-03-23 DIAGNOSIS — J449 Chronic obstructive pulmonary disease, unspecified: Secondary | ICD-10-CM | POA: Diagnosis not present

## 2020-03-23 DIAGNOSIS — I110 Hypertensive heart disease with heart failure: Secondary | ICD-10-CM | POA: Diagnosis not present

## 2020-03-23 MED ORDER — AMIODARONE HCL 200 MG PO TABS: 200.0000 mg | ORAL_TABLET | Freq: Every day | ORAL | 3 refills | Status: DC

## 2020-03-23 NOTE — Telephone Encounter (Signed)
*  STAT* If patient is at the pharmacy, call can be transferred to refill team.   1. Which medications need to be refilled? (please list name of each medication and dose if known) Amiodarone  2. Which pharmacy/location (including street and city if local pharmacy) is medication to be sent to? [facility]  3. Do they need a 30 day or 90 day supply? 90   

## 2020-03-25 ENCOUNTER — Telehealth: Payer: Self-pay

## 2020-03-25 DIAGNOSIS — I110 Hypertensive heart disease with heart failure: Secondary | ICD-10-CM | POA: Diagnosis not present

## 2020-03-25 DIAGNOSIS — I480 Paroxysmal atrial fibrillation: Secondary | ICD-10-CM | POA: Diagnosis not present

## 2020-03-25 DIAGNOSIS — J181 Lobar pneumonia, unspecified organism: Secondary | ICD-10-CM | POA: Diagnosis not present

## 2020-03-25 DIAGNOSIS — J449 Chronic obstructive pulmonary disease, unspecified: Secondary | ICD-10-CM | POA: Diagnosis not present

## 2020-03-25 DIAGNOSIS — I5030 Unspecified diastolic (congestive) heart failure: Secondary | ICD-10-CM | POA: Diagnosis not present

## 2020-03-25 DIAGNOSIS — I4811 Longstanding persistent atrial fibrillation: Secondary | ICD-10-CM | POA: Diagnosis not present

## 2020-03-25 NOTE — Telephone Encounter (Signed)
I left VM for Ria Comment

## 2020-03-25 NOTE — Telephone Encounter (Signed)
Error

## 2020-03-25 NOTE — Telephone Encounter (Signed)
We are unable to see her in the office. Recommend UC eval

## 2020-03-25 NOTE — Telephone Encounter (Signed)
I received a phone call from [place] with [facility] and he states she was visiting the patient today, and she stated that the patient states she is not feeling that great, and states she just feels "tired" - and she also toko a listen to her lungs and noticed she is having crackling at the bases of her lungs. She is wondering if we can see her in the office, or what can be done? She states she presented with very mild respiratory sx about 1 month ago, and she ended up in the hospital. She states she is wanting to prevent this from happening again.

## 2020-03-26 ENCOUNTER — Other Ambulatory Visit: Payer: Self-pay

## 2020-03-26 MED ORDER — DILTIAZEM HCL 60 MG PO TABS: 60.0000 mg | ORAL_TABLET | Freq: Three times a day (TID) | ORAL | 2 refills | Status: DC

## 2020-03-26 NOTE — Telephone Encounter (Signed)
I left detailed VM for Ria Comment on secure line - advised to call back with any questions.

## 2020-03-26 NOTE — Telephone Encounter (Signed)
Ria Comment nurse with [facility] left v/m requesting cb about call from 03/25/20 and hearing crackles from base of lower lobe of lungs.

## 2020-03-30 DIAGNOSIS — R42 Dizziness and giddiness: Secondary | ICD-10-CM | POA: Diagnosis not present

## 2020-03-30 DIAGNOSIS — I4811 Longstanding persistent atrial fibrillation: Secondary | ICD-10-CM | POA: Diagnosis not present

## 2020-03-30 DIAGNOSIS — K219 Gastro-esophageal reflux disease without esophagitis: Secondary | ICD-10-CM | POA: Diagnosis not present

## 2020-03-30 DIAGNOSIS — I480 Paroxysmal atrial fibrillation: Secondary | ICD-10-CM | POA: Diagnosis not present

## 2020-03-30 DIAGNOSIS — I110 Hypertensive heart disease with heart failure: Secondary | ICD-10-CM | POA: Diagnosis not present

## 2020-03-30 DIAGNOSIS — J181 Lobar pneumonia, unspecified organism: Secondary | ICD-10-CM | POA: Diagnosis not present

## 2020-03-30 DIAGNOSIS — Z85828 Personal history of other malignant neoplasm of skin: Secondary | ICD-10-CM | POA: Diagnosis not present

## 2020-03-30 DIAGNOSIS — M1990 Unspecified osteoarthritis, unspecified site: Secondary | ICD-10-CM | POA: Diagnosis not present

## 2020-03-30 DIAGNOSIS — J449 Chronic obstructive pulmonary disease, unspecified: Secondary | ICD-10-CM | POA: Diagnosis not present

## 2020-03-30 DIAGNOSIS — Z87442 Personal history of urinary calculi: Secondary | ICD-10-CM | POA: Diagnosis not present

## 2020-03-30 DIAGNOSIS — I5030 Unspecified diastolic (congestive) heart failure: Secondary | ICD-10-CM | POA: Diagnosis not present

## 2020-03-30 DIAGNOSIS — Z86711 Personal history of pulmonary embolism: Secondary | ICD-10-CM | POA: Diagnosis not present

## 2020-03-30 DIAGNOSIS — R918 Other nonspecific abnormal finding of lung field: Secondary | ICD-10-CM | POA: Diagnosis not present

## 2020-03-30 DIAGNOSIS — Z7901 Long term (current) use of anticoagulants: Secondary | ICD-10-CM | POA: Diagnosis not present

## 2020-03-30 DIAGNOSIS — Z9181 History of falling: Secondary | ICD-10-CM | POA: Diagnosis not present

## 2020-03-30 DIAGNOSIS — K449 Diaphragmatic hernia without obstruction or gangrene: Secondary | ICD-10-CM | POA: Diagnosis not present

## 2020-04-01 DIAGNOSIS — J181 Lobar pneumonia, unspecified organism: Secondary | ICD-10-CM | POA: Diagnosis not present

## 2020-04-01 DIAGNOSIS — I5030 Unspecified diastolic (congestive) heart failure: Secondary | ICD-10-CM | POA: Diagnosis not present

## 2020-04-01 DIAGNOSIS — I4811 Longstanding persistent atrial fibrillation: Secondary | ICD-10-CM | POA: Diagnosis not present

## 2020-04-01 DIAGNOSIS — I110 Hypertensive heart disease with heart failure: Secondary | ICD-10-CM | POA: Diagnosis not present

## 2020-04-01 DIAGNOSIS — J449 Chronic obstructive pulmonary disease, unspecified: Secondary | ICD-10-CM | POA: Diagnosis not present

## 2020-04-01 DIAGNOSIS — I480 Paroxysmal atrial fibrillation: Secondary | ICD-10-CM | POA: Diagnosis not present

## 2020-04-05 DIAGNOSIS — I4811 Longstanding persistent atrial fibrillation: Secondary | ICD-10-CM | POA: Diagnosis not present

## 2020-04-05 DIAGNOSIS — J181 Lobar pneumonia, unspecified organism: Secondary | ICD-10-CM | POA: Diagnosis not present

## 2020-04-05 DIAGNOSIS — I110 Hypertensive heart disease with heart failure: Secondary | ICD-10-CM | POA: Diagnosis not present

## 2020-04-05 DIAGNOSIS — J449 Chronic obstructive pulmonary disease, unspecified: Secondary | ICD-10-CM | POA: Diagnosis not present

## 2020-04-05 DIAGNOSIS — I5030 Unspecified diastolic (congestive) heart failure: Secondary | ICD-10-CM | POA: Diagnosis not present

## 2020-04-05 DIAGNOSIS — I480 Paroxysmal atrial fibrillation: Secondary | ICD-10-CM | POA: Diagnosis not present

## 2020-04-07 DIAGNOSIS — I5030 Unspecified diastolic (congestive) heart failure: Secondary | ICD-10-CM | POA: Diagnosis not present

## 2020-04-07 DIAGNOSIS — J181 Lobar pneumonia, unspecified organism: Secondary | ICD-10-CM | POA: Diagnosis not present

## 2020-04-07 DIAGNOSIS — I110 Hypertensive heart disease with heart failure: Secondary | ICD-10-CM | POA: Diagnosis not present

## 2020-04-07 DIAGNOSIS — J449 Chronic obstructive pulmonary disease, unspecified: Secondary | ICD-10-CM | POA: Diagnosis not present

## 2020-04-07 DIAGNOSIS — I4811 Longstanding persistent atrial fibrillation: Secondary | ICD-10-CM | POA: Diagnosis not present

## 2020-04-07 DIAGNOSIS — I480 Paroxysmal atrial fibrillation: Secondary | ICD-10-CM | POA: Diagnosis not present

## 2020-04-12 ENCOUNTER — Ambulatory Visit: Payer: Self-pay | Admitting: *Deleted

## 2020-04-12 DIAGNOSIS — I5030 Unspecified diastolic (congestive) heart failure: Secondary | ICD-10-CM | POA: Diagnosis not present

## 2020-04-12 DIAGNOSIS — J449 Chronic obstructive pulmonary disease, unspecified: Secondary | ICD-10-CM | POA: Diagnosis not present

## 2020-04-12 DIAGNOSIS — I4811 Longstanding persistent atrial fibrillation: Secondary | ICD-10-CM | POA: Diagnosis not present

## 2020-04-12 DIAGNOSIS — I480 Paroxysmal atrial fibrillation: Secondary | ICD-10-CM | POA: Diagnosis not present

## 2020-04-12 DIAGNOSIS — I110 Hypertensive heart disease with heart failure: Secondary | ICD-10-CM | POA: Diagnosis not present

## 2020-04-12 DIAGNOSIS — J181 Lobar pneumonia, unspecified organism: Secondary | ICD-10-CM | POA: Diagnosis not present

## 2020-04-13 ENCOUNTER — Ambulatory Visit: Payer: Self-pay | Admitting: *Deleted

## 2020-04-14 DIAGNOSIS — M25519 Pain in unspecified shoulder: Secondary | ICD-10-CM | POA: Diagnosis not present

## 2020-04-14 DIAGNOSIS — M5416 Radiculopathy, lumbar region: Secondary | ICD-10-CM | POA: Diagnosis not present

## 2020-04-15 DIAGNOSIS — J181 Lobar pneumonia, unspecified organism: Secondary | ICD-10-CM | POA: Diagnosis not present

## 2020-04-15 DIAGNOSIS — I110 Hypertensive heart disease with heart failure: Secondary | ICD-10-CM | POA: Diagnosis not present

## 2020-04-15 DIAGNOSIS — I480 Paroxysmal atrial fibrillation: Secondary | ICD-10-CM | POA: Diagnosis not present

## 2020-04-15 DIAGNOSIS — I5030 Unspecified diastolic (congestive) heart failure: Secondary | ICD-10-CM | POA: Diagnosis not present

## 2020-04-15 DIAGNOSIS — I4811 Longstanding persistent atrial fibrillation: Secondary | ICD-10-CM | POA: Diagnosis not present

## 2020-04-15 DIAGNOSIS — J449 Chronic obstructive pulmonary disease, unspecified: Secondary | ICD-10-CM | POA: Diagnosis not present

## 2020-04-16 ENCOUNTER — Ambulatory Visit: Payer: Self-pay | Admitting: *Deleted

## 2020-04-16 ENCOUNTER — Other Ambulatory Visit: Payer: Self-pay | Admitting: *Deleted

## 2020-04-16 NOTE — Patient Outreach (Addendum)
Julie Davies) Care Management  04/16/2020  Julie Davies 18-Oct-1926 [id number]    Subjective: Telephone call to patient's home / mobile number, spoke with patient, and HIPAA verified.  Patient states she is doing very well and remembers speaking with this RNCM in the past.  States she has attended all of her provider follow up appointments and all appointments went well.   States her next follow up appointment is on 05/10/2020 with pulmonologist, will call to schedule follow up appointment with cardiologist, and has 2 month follow up scheduled with orthopedic MD (unsure of the exact date) at this time. Patient states she continues to receiving home health therapy for 1 additional week, home health nursing for 2 additional weeks, and services are going great.   States she is looking forward to resuming her senior exercise class in the near future, is in need of transportation assistance to class if available (daughter works from home during the day), and is in agreement to a referral to [place] Management Social Worker for Restaurant manager, fast food.   States she feels her asthma and the pneumonia are being well managed, is under control, and was triggered by extreme pollen exposure in [place] prior to going to [place].   Patient states she does not have any education material, transition of care, care coordination, disease management, disease monitoring, or pharmacy needs at this time.  States she is very appreciative of the follow up, is in agreement  to receive 1 additional follow up call to assess for further CM needs, does not currently having any RNCM needs, and is in agreement to receive [place] Management Social Worker services as needed.  States she did receive letter from [facility] and will contact  RNCM if assistance needed prior to next patient outreach.     Objective: Per KPN (Knowledge Performance Now, point of care tool), chart review, patient hospitalized  02/12/2020- 02/17/2020 for pneumonia  and 12/31/2019 - 01/04/2020 for sepsis secondary to pneumonia at [facility], [place] ( was visiting her son).    Patient also has a history of hypertension, COPD, pneumonia, Anemia secondary to chronic disease, Atrial fibrillation with rapid ventricular response, Asthma, Basal cell carcinoma of skin, Diastolic dysfunction, Hiatal hernia, Pulmonary embolism on left, and kidney stones.    Assessment: Received Medicare MD Referral on 02/27/2020.  Referral source: Wayne Both,  Nurse Practitioner.  Referral reason: frequent hospital visit,  Diagnoses of COPD/ Pneumonia.  Screening follow up completed and will follow up to assess for care management needs.       Plan: RNCM has sent patient successful outreach letter, [facility] pamphlet, and magnet, per patient's request. RNCM will send [place] Management Social Worker referral for transportation Chartered certified accountant. RNCM will call patient for telephone outreach attempt, within 60 business days, assess for CM needs.      Julie Davies H. Annia Friendly, BSN, [place] Management [facility] Telephonic CM Phone: [phone] Fax: [phone]

## 2020-04-19 DIAGNOSIS — I110 Hypertensive heart disease with heart failure: Secondary | ICD-10-CM | POA: Diagnosis not present

## 2020-04-19 DIAGNOSIS — I4811 Longstanding persistent atrial fibrillation: Secondary | ICD-10-CM | POA: Diagnosis not present

## 2020-04-19 DIAGNOSIS — J449 Chronic obstructive pulmonary disease, unspecified: Secondary | ICD-10-CM | POA: Diagnosis not present

## 2020-04-19 DIAGNOSIS — J181 Lobar pneumonia, unspecified organism: Secondary | ICD-10-CM | POA: Diagnosis not present

## 2020-04-19 DIAGNOSIS — I480 Paroxysmal atrial fibrillation: Secondary | ICD-10-CM | POA: Diagnosis not present

## 2020-04-19 DIAGNOSIS — I5030 Unspecified diastolic (congestive) heart failure: Secondary | ICD-10-CM | POA: Diagnosis not present

## 2020-04-20 ENCOUNTER — Other Ambulatory Visit: Payer: Self-pay | Admitting: *Deleted

## 2020-04-20 NOTE — Patient Outreach (Signed)
[facility] [facility]) Care Management  04/20/2020  Julie Davies [birth year] [id number]   CSW made contact with pt and confirmed her identity,  CSW introduced self, role and reason for call; transportation.  Per pt, she is hoping to go the [employer] for exercise and other activities, however she is not been driving since the [place] pandemic.  Pt proudly states; "I will be 81 in September".  CSW commended her for remaining active and wanting to seek options for getting to the exercise class.  CSW advised pt of transportation options for seniors in the [place] area and will mail pt info to review.  CSW will plan a follow up call in the next 2-3 weeks for further discussion and confirmation of info mailed.    Eduard Clos, MSW, [place] Worker  [place] [phone]

## 2020-04-21 ENCOUNTER — Other Ambulatory Visit: Payer: Self-pay | Admitting: Cardiovascular Disease

## 2020-04-21 NOTE — Telephone Encounter (Signed)
Please review for refill. Thanks!  

## 2020-04-21 NOTE — Telephone Encounter (Signed)
Pt's age 86, wt 82.8 kg, SCr 0.94, CrCl 51.99, last ov w/ CW 03/01/20.

## 2020-04-22 DIAGNOSIS — I5030 Unspecified diastolic (congestive) heart failure: Secondary | ICD-10-CM | POA: Diagnosis not present

## 2020-04-22 DIAGNOSIS — J449 Chronic obstructive pulmonary disease, unspecified: Secondary | ICD-10-CM | POA: Diagnosis not present

## 2020-04-22 DIAGNOSIS — I4811 Longstanding persistent atrial fibrillation: Secondary | ICD-10-CM | POA: Diagnosis not present

## 2020-04-22 DIAGNOSIS — I480 Paroxysmal atrial fibrillation: Secondary | ICD-10-CM | POA: Diagnosis not present

## 2020-04-22 DIAGNOSIS — J181 Lobar pneumonia, unspecified organism: Secondary | ICD-10-CM | POA: Diagnosis not present

## 2020-04-22 DIAGNOSIS — I110 Hypertensive heart disease with heart failure: Secondary | ICD-10-CM | POA: Diagnosis not present

## 2020-04-26 DIAGNOSIS — I110 Hypertensive heart disease with heart failure: Secondary | ICD-10-CM | POA: Diagnosis not present

## 2020-04-26 DIAGNOSIS — I4811 Longstanding persistent atrial fibrillation: Secondary | ICD-10-CM | POA: Diagnosis not present

## 2020-04-26 DIAGNOSIS — J181 Lobar pneumonia, unspecified organism: Secondary | ICD-10-CM | POA: Diagnosis not present

## 2020-04-26 DIAGNOSIS — J449 Chronic obstructive pulmonary disease, unspecified: Secondary | ICD-10-CM | POA: Diagnosis not present

## 2020-04-26 DIAGNOSIS — I5030 Unspecified diastolic (congestive) heart failure: Secondary | ICD-10-CM | POA: Diagnosis not present

## 2020-04-26 DIAGNOSIS — I480 Paroxysmal atrial fibrillation: Secondary | ICD-10-CM | POA: Diagnosis not present

## 2020-05-06 ENCOUNTER — Ambulatory Visit: Payer: Self-pay | Admitting: *Deleted

## 2020-05-06 ENCOUNTER — Other Ambulatory Visit: Payer: Self-pay | Admitting: *Deleted

## 2020-05-06 NOTE — Patient Outreach (Signed)
[place] [facility]) Care Management  05/06/2020  Jamyiah Labella Jan 20, 1927 [id number]   CSW attempted to reach pt today by phone and was unable to reach.  CSW left a HIPPA compliant voice message for return call.  CSW will attempt outreach again in 3-4  Business days if no return call is received.   Eduard Clos, MSW, [place] Worker  [place] [phone]

## 2020-05-08 NOTE — Progress Notes (Signed)
Date:  05/10/2020   ID:  [name], DOB 1927/01/25, MRN [id number]  Patient Location:  [address] [place] [address]   Provider location:   [facility], [place] office  PCP:  Jearld Fenton, NP  Cardiologist:  Ar[facility]  Chief Complaint  Patient presents with  . other    2 month follow up. Meds reviewed by the pt. verbally. Pt. c/o shortness of breath and chest pain at times.      History of Present Illness:    Julie Davies is a 86 y.o. female  past medical history of Mild COPD/asthma PE on left  HTN PAF Prior history of orthostasis Significant coronary disease noticed on chest CT scan Presents for f/u of her chest pain, shortness of breath, PAF  Last seen in clinic October 2019 Telemetry visit April 2020  May 2019 admitted to [facility] with acute exacerbation of COPD and also new onset of A. fib with RVR.   Hospitalized 12/31/19-01/04/20 for right sided pneumonia with episode of atrial fibrillation with RVR.   admitted to [facility] in [place] 02/12/20-02/17/20.  shortness of breath and weakness.  Again in atrial fibrillation with HR 116 bpm.  Her diltiazem was increased, Amiodarone and Eliquis continued.  PNA treated with IV abx. COPD exacerbation treated with nebulizers.   Home health helped with rehab She is not participating in exercise twice a week  Have more GERD, On omeprazole, gavascon Seen in the past by GI  Weight stable: weight at home 176 to 180 No edema  EKG personally reviewed by myself on todays visit NSR with LAD, IVCD rate 66 bpm  Other past medical history reviewed  ZIO monitor which showed 14% A. fib burden.    longest spell lasted 14 hours and 8 minutes with an average rate of 94 bpmon eliquis 5 BID  Old lab work reviewed Total chol 151, LDL 73 CR 1.13, Bun 20   Prior CV studies:   The following studies were reviewed today:  Stress test for chest pain 01/2018 Pharmacological  myocardial perfusion imaging study with no significant ischemia Attenuation corrected images ( CT scan) not available secondary to technical issues Regions of fixed mildly decreased perfusion noted in the anteroseptal wall, apical inferior region, likely secondary to GI uptake artifact, breast attenuation artifact EF estimated at 34%, depressed ejection fraction secondary to GI uptake artifact No significant regional wall motion abnormality noted No EKG changes concerning for ischemia at peak stress or in recovery. Resting EKG with intraventricular conduction delay, APCs, which may be contributing to the mild fixed perfusion defects detailed above Low risk scan, clinical correlation suggested  Echo 01/2018,  - Left ventricle: The cavity size was normal. There was mild focal basal hypertrophy of the septum. Systolic function was normal. The estimated ejection fraction was in the range of 60% to 65%. Wall motion was normal; there were no regional wall motion abnormalities. Doppler parameters are consistent with abnormal left ventricular relaxation (grade 1 diastolic dysfunction). - Mitral valve: Calcified annulus. There was mild regurgitation. - Left atrium: The atrium was moderately dilated. - Right ventricle: The cavity size was normal. Wall thickness was normal. Systolic function was normal. - Pulmonary arteries: Systolic pressure was mildly to moderately increased, in the range of 40 mm Hg to 45 mm Hg.   Past Medical History:  Diagnosis Date  . Allergy   . Arthritis   . Asthma   . Basal cell carcinoma of skin   .  Chicken pox   . Diastolic dysfunction    a. TTE 5/19: EF 60-65%, mild focal basal hypertrophy, no RWMA, Gr1DD, mod dilated LA, RV cavity size nl with nl RVSF, PASP 40-45, mildly dilated IVC  . GERD (gastroesophageal reflux disease)   . Hiatal hernia    a. noted on Chest CT 03/2017.  Marland Kitchen History of cardiovascular stress test    a. 01/2018 MV: No ischemia. Fixed  antsept, apical inferior defects - likely 2/2 GI uptake artifact (attenuation corrected images not available). Low risk.  Marland Kitchen History of kidney stones   . PAF (paroxysmal atrial fibrillation) ([place])    a. diagnosed 5/19; b. CHADS2VASc = 4 (age x 2, vascular disease - noted on CT chest 7/18, female)--> Eliquis; c. 01/2018 Zio Monitor: 14% Afib.  Marland Kitchen Postural dizziness with presyncope    a. TTE 8/18: EF 55-60%, nl WM, Gr1DD, mild MR, mildly dilated LA  . Pulmonary embolism on left [facility])    a. in the setting of right TKA; b. treated with Coumadin  . Pulmonary nodules    a. 03/2017 Chest CT: resolution of RUL ground glass nodule. Stable, numerous small bilat pulm nodules since 2016-->benign.   Past Surgical History:  Procedure Laterality Date  . APPENDECTOMY  1935  . DILATION AND CURETTAGE OF UTERUS  1975  . NEPHRECTOMY Right 1975  . REPLACEMENT TOTAL KNEE BILATERAL  2013  . VEIN LIGATION AND STRIPPING Left 1971     Current Meds  Medication Sig  . acetaminophen (TYLENOL) 500 MG tablet Take 500 mg by mouth every 4 (four) hours as needed for mild pain.   Marland Kitchen albuterol (VENTOLIN HFA) 108 (90 Base) MCG/ACT inhaler Inhale 2 puffs into the lungs every 4 (four) hours as needed for wheezing or shortness of breath.  Marland Kitchen alendronate (FOSAMAX) 70 MG tablet TAKE 1 TABLET EVERY 7 DAYS WITH A FULL GLASS OF WATER ON AN EMPTY STOMACH  . amiodarone (PACERONE) 200 MG tablet Take 1 tablet (200 mg total) by mouth daily.  . benzonatate (TESSALON) 200 MG capsule Take 1 capsule (200 mg total) by mouth 3 (three) times daily as needed for cough.  . Cholecalciferol (VITAMIN D) 2000 units CAPS Take 1 capsule by mouth daily.  Marland Kitchen diltiazem (CARDIZEM) 60 MG tablet Take 1 tablet (60 mg total) by mouth 3 (three) times daily.  Marland Kitchen docusate sodium (COLACE) 100 MG capsule Take 100-200 mg by mouth 2 (two) times daily. Take 200 mg by mouth in the morning and 100 mg by mouth at bedtime.  Marland Kitchen ELIQUIS 5 MG TABS tablet TAKE 1 TABLET TWICE A DAY   . escitalopram (LEXAPRO) 5 MG tablet TAKE 1 TABLET DAILY  . fexofenadine (ALLEGRA) 180 MG tablet Take 180 mg by mouth daily.  . fluticasone-salmeterol (ADVAIR HFA) 115-21 MCG/ACT inhaler USE 2 INHALATIONS TWICE A DAY  . furosemide (LASIX) 20 MG tablet TAKE 1 TABLET DAILY  . gabapentin (NEURONTIN) 300 MG capsule Take 300 mg by mouth 2 (two) times daily.   Donnie Aho (GLUCOSAMINE MSM COMPLEX) TABS Take 1 tablet by mouth 2 (two) times daily.  . Misc Natural Products (TART CHERRY ADVANCED) CAPS Take 1 capsule by mouth 2 (two) times daily.   . Multiple Vitamins-Minerals (ONE DAILY MULTIVITAMIN WOMEN PO) Take 1 tablet by mouth daily.  Marland Kitchen omeprazole (PRILOSEC) 40 MG capsule Take 1 capsule (40 mg total) by mouth daily.  . polyethylene glycol (MIRALAX / GLYCOLAX) packet Take 17 g by mouth daily as needed for mild constipation.  . simvastatin (  ZOCOR) 20 MG tablet Take 1 tablet (20 mg total) by mouth daily.  . TURMERIC PO Take 300 mg by mouth at bedtime.      Allergies:   Tramadol   Social History   Tobacco Use  . Smoking status: Never Smoker  . Smokeless tobacco: Never Used  Vaping Use  . Vaping Use: Never used  Substance Use Topics  . Alcohol use: Yes    Alcohol/week: 0.0 standard drinks    Comment: rare--wine  . Drug use: No     Current Outpatient Medications on File Prior to Visit  Medication Sig Dispense Refill  . acetaminophen (TYLENOL) 500 MG tablet Take 500 mg by mouth every 4 (four) hours as needed for mild pain.     Marland Kitchen albuterol (VENTOLIN HFA) 108 (90 Base) MCG/ACT inhaler Inhale 2 puffs into the lungs every 4 (four) hours as needed for wheezing or shortness of breath. 18 g 0  . alendronate (FOSAMAX) 70 MG tablet TAKE 1 TABLET EVERY 7 DAYS WITH A FULL GLASS OF WATER ON AN EMPTY STOMACH 12 tablet 3  . amiodarone (PACERONE) 200 MG tablet Take 1 tablet (200 mg total) by mouth daily. 90 tablet 3  . benzonatate (TESSALON) 200 MG capsule Take 1 capsule (200 mg total)  by mouth 3 (three) times daily as needed for cough. 30 capsule 1  . Cholecalciferol (VITAMIN D) 2000 units CAPS Take 1 capsule by mouth daily.    Marland Kitchen diltiazem (CARDIZEM) 60 MG tablet Take 1 tablet (60 mg total) by mouth 3 (three) times daily. 90 tablet 2  . docusate sodium (COLACE) 100 MG capsule Take 100-200 mg by mouth 2 (two) times daily. Take 200 mg by mouth in the morning and 100 mg by mouth at bedtime.    Marland Kitchen ELIQUIS 5 MG TABS tablet TAKE 1 TABLET TWICE A DAY 180 tablet 1  . escitalopram (LEXAPRO) 5 MG tablet TAKE 1 TABLET DAILY 90 tablet 2  . fexofenadine (ALLEGRA) 180 MG tablet Take 180 mg by mouth daily.    . fluticasone-salmeterol (ADVAIR HFA) 115-21 MCG/ACT inhaler USE 2 INHALATIONS TWICE A DAY 36 g 0  . furosemide (LASIX) 20 MG tablet TAKE 1 TABLET DAILY 90 tablet 3  . gabapentin (NEURONTIN) 300 MG capsule Take 300 mg by mouth 2 (two) times daily.     Donnie Aho (GLUCOSAMINE MSM COMPLEX) TABS Take 1 tablet by mouth 2 (two) times daily.    . Misc Natural Products (TART CHERRY ADVANCED) CAPS Take 1 capsule by mouth 2 (two) times daily.     . Multiple Vitamins-Minerals (ONE DAILY MULTIVITAMIN WOMEN PO) Take 1 tablet by mouth daily.    Marland Kitchen omeprazole (PRILOSEC) 40 MG capsule Take 1 capsule (40 mg total) by mouth daily. 90 capsule 2  . polyethylene glycol (MIRALAX / GLYCOLAX) packet Take 17 g by mouth daily as needed for mild constipation. 14 each 0  . simvastatin (ZOCOR) 20 MG tablet Take 1 tablet (20 mg total) by mouth daily. 90 tablet 2  . TURMERIC PO Take 300 mg by mouth at bedtime.      No current facility-administered medications on file prior to visit.     Family Hx: The patient's family history includes Heart disease in her father and maternal aunt; Stroke in her maternal uncle. There is no history of Cancer or Diabetes.  ROS:   Please see the history of present illness.    Review of Systems  Constitutional: Negative.   HENT: Negative.   Respiratory:  Negative.   Cardiovascular: Negative.   Gastrointestinal: Negative.   Musculoskeletal: Negative.   Neurological: Negative.   Psychiatric/Behavioral: Negative.   All other systems reviewed and are negative.    Labs/Other Tests and Data Reviewed:    Recent Labs: 02/24/2020: ALT 20; BUN 9; Creatinine, Ser 0.94; Hemoglobin 11.6; Platelets 345.0; Potassium 4.0; Sodium 139; TSH 2.57   Recent Lipid Panel Lab Results  Component Value Date/Time   CHOL 158 11/10/2019 10:02 AM   TRIG 117.0 11/10/2019 10:02 AM   HDL 56.40 11/10/2019 10:02 AM   CHOLHDL 3 11/10/2019 10:02 AM   LDLCALC 78 11/10/2019 10:02 AM    Wt Readings from Last 3 Encounters:  05/10/20 181 lb 8 oz (82.3 kg)  03/08/20 182 lb 9.6 oz (82.8 kg)  03/01/20 182 lb 4 oz (82.7 kg)     Exam:   BP 116/60 (BP Location: Left Arm, Patient Position: Sitting, Cuff Size: Normal)   Pulse 66   Ht 5\' 7"  (1.702 m)   Wt 181 lb 8 oz (82.3 kg)   SpO2 98%   BMI 28.43 kg/m   Constitutional:  oriented to person, place, and time. No distress.  HENT:  Head: Normocephalic and atraumatic.  Eyes:  no discharge. No scleral icterus.  Neck: Normal range of motion. Neck supple. No JVD present.  Cardiovascular: Normal rate, regular rhythm, normal heart sounds and intact distal pulses. Exam reveals no gallop and no friction rub. No edema No murmur heard. Pulmonary/Chest: Effort normal and breath sounds normal. No stridor. No respiratory distress.  no wheezes.  no rales.  no tenderness.  Abdominal: Soft.  no distension.  no tenderness.  Musculoskeletal: Normal range of motion.  no  tenderness or deformity.  Neurological:  normal muscle tone. Coordination normal. No atrophy Skin: Skin is warm and dry. No rash noted. not diaphoretic.  Psychiatric:  normal mood and affect. behavior is normal. Thought content normal.    ASSESSMENT & PLAN:    Chronic diastolic CHF (congestive heart failure) (HCC) Euvolemic, taking Lasix daily Repeat labs in 2 to 3  months  Coronary artery calcification seen on CAT scan Currently with no symptoms of angina. No further workup at this time. Continue current medication regimen. Cholesterol at goal  Essential hypertension Blood pressure well controlled, no medication changes made  Mixed hyperlipidemia On a statin, no changes made  PAF (paroxysmal atrial fibrillation) ([place]) Long discussion with her, recent flareups of her atrial fibrillation in setting of upper respiratory tract infections and pneumonia Continue amiodarone,diltiazem 60 TID   Total encounter time more than 25 minutes  Greater than 50% was spent in counseling and coordination of care with the patient     Signed, Ida Rogue, MD  05/10/2020 9:08 AM    [place] [facility] [address], [place], [place] [address]

## 2020-05-10 ENCOUNTER — Other Ambulatory Visit: Payer: Self-pay

## 2020-05-10 ENCOUNTER — Encounter: Payer: Self-pay | Admitting: Cardiovascular Disease

## 2020-05-10 ENCOUNTER — Ambulatory Visit (INDEPENDENT_AMBULATORY_CARE_PROVIDER_SITE_OTHER): Payer: Medicare Other | Admitting: Cardiovascular Disease

## 2020-05-10 VITALS — BP 116/60 | HR 66 | Ht 67.0 in | Wt 181.5 lb

## 2020-05-10 DIAGNOSIS — R42 Dizziness and giddiness: Secondary | ICD-10-CM

## 2020-05-10 DIAGNOSIS — Z7901 Long term (current) use of anticoagulants: Secondary | ICD-10-CM | POA: Diagnosis not present

## 2020-05-10 DIAGNOSIS — E782 Mixed hyperlipidemia: Secondary | ICD-10-CM

## 2020-05-10 DIAGNOSIS — I5032 Chronic diastolic (congestive) heart failure: Secondary | ICD-10-CM

## 2020-05-10 DIAGNOSIS — I10 Essential (primary) hypertension: Secondary | ICD-10-CM | POA: Diagnosis not present

## 2020-05-10 DIAGNOSIS — I2510 Atherosclerotic heart disease of native coronary artery without angina pectoris: Secondary | ICD-10-CM | POA: Diagnosis not present

## 2020-05-10 DIAGNOSIS — I480 Paroxysmal atrial fibrillation: Secondary | ICD-10-CM

## 2020-05-10 NOTE — Patient Instructions (Addendum)
Medication Instructions:  No changes  If you need a refill on your cardiac medications before your next appointment, please call your pharmacy.    Lab work: BMP in 2-3 months. Go to the [facility] Entrance and check in at the registration desk. No appointment needed for this.    If you have labs (blood work) drawn today and your tests are completely normal, you will receive your results only by: Marland Kitchen MyChart Message (if you have MyChart) OR . A paper copy in the mail If you have any lab test that is abnormal or we need to change your treatment, we will call you to review the results.   Testing/Procedures: No new testing needed   Follow-Up: At [facility], you and your health needs are our priority.  As part of our continuing mission to provide you with exceptional heart care, we have created designated Provider Care Teams.  These Care Teams include your primary Cardiologist (physician) and Advanced Practice Providers (APPs -  Physician Assistants and Nurse Practitioners) who all work together to provide you with the care you need, when you need it.  . You will need a follow up appointment in 6 months  . Providers on your designated Care Team:   . Murray Hodgkins, NP . Christell Faith, PA-C . Marrianne Mood, PA-C  Any Other Special Instructions Will Be Listed Below (If Applicable).  COVID-19 Vaccine Information can be found at: ShippingScam.co.uk For questions related to vaccine distribution or appointments, please email [email or web address] or call [phone].

## 2020-05-11 ENCOUNTER — Other Ambulatory Visit: Payer: Self-pay | Admitting: *Deleted

## 2020-05-12 NOTE — Patient Outreach (Signed)
[place] [facility]) Care Management  05/12/2020  Julie Davies 1927-02-10 [id number]   CSW made contact with pt on 05/11/2020 who confirms she received info mailed to her and is prepared to use local transportation [employer] as need be.  Pt denies any other concerns or needs. CSW will sign off and reminded pt ot call if needs arise.  CSW will update [facility] team and PCP.   Eduard Clos, MSW, [place] Worker  [place] [phone]

## 2020-06-18 ENCOUNTER — Ambulatory Visit: Payer: Medicare Other | Admitting: *Deleted

## 2020-06-18 ENCOUNTER — Other Ambulatory Visit: Payer: Self-pay | Admitting: *Deleted

## 2020-06-18 NOTE — Patient Outreach (Signed)
[place] [facility]) Care Management  06/18/2020  Julie Davies 1927-09-07 [id number]   Subjective: Telephone call to patient's home / mobile number, spoke with patient, and HIPAA verified.  Patient states she is doing well, currently at the nail shop, getting nails done, and requested call back at a later.      Objective:Per KPN (Knowledge Performance Now, point of care tool), chart review,patient hospitalized 02/12/2020- 02/17/2020 for pneumonia and 12/31/2019 - 01/04/2020 for sepsis secondary to pneumonia at [facility], [place]( was visiting her son). Patient also has a history of hypertension, COPD, pneumonia, Anemia secondary to chronic disease,Atrial fibrillation with rapid ventricular response,Asthma,Basal cell carcinoma of skin,Diastolic dysfunction,Hiatal hernia,Pulmonary embolism on left, andkidney stones.    Assessment: Received Medicare MD Referral on 02/27/2020. Referral source: [facility],  NursePractitioner. Referral reason: frequent hospital visit,Diagnoses of COPD/ Pneumonia.Screening follow up completed andwill follow up to assess forcare management needs.      Plan:RNCM will call patient for 2nd telephone outreach attempt, within4 business days,assess for CM needs, will proceed with case closure, after 4th unsuccessful outreach call.      [place] Stra[place] H. Annia Friendly, BSN, [place] Management [facility] Telephonic CM Phone: [phone] Fax: [phone]

## 2020-06-21 ENCOUNTER — Other Ambulatory Visit: Payer: Self-pay | Admitting: *Deleted

## 2020-06-21 NOTE — Patient Outreach (Signed)
[place] [facility]) Care Management  06/21/2020  Julie Davies 12-24-1926 [id number]   Subjective: Telephone call to patient's home  / mobile number, no answer, left HIPAA compliant voicemail message, and requested call back.    Objective:Per KPN (Knowledge Performance Now, point of care tool), chart review,patient hospitalized 02/12/2020- 02/17/2020 for pneumonia and 12/31/2019 - 01/04/2020 for sepsis secondary to pneumonia at [facility], [place]( was visiting her son). Patient also has a history of hypertension, COPD, pneumonia, Anemia secondary to chronic disease,Atrial fibrillation with rapid ventricular response,Asthma,Basal cell carcinoma of skin,Diastolic dysfunction,Hiatal hernia,Pulmonary embolism on left, andkidney stones.    Assessment: Received Medicare MD Referral on 02/27/2020. Referral source: [facility],  NursePractitioner. Referral reason: frequent hospital visit,Diagnoses of COPD/ Pneumonia.Screening follow up completed andwill follow up to assess forcare management needs.      Plan:RNCM will send unsuccessful outreach  letter, [facility] pamphlet, will call patient for 3rd telephone outreach attempt, within4 business days,assess for CM needs, and will proceed with case closure, after 4th unsuccessful outreach call.       Breven Guidroz H. Annia Friendly, BSN, [place] Management [facility] Telephonic CM Phone: [phone] Fax: [phone]

## 2020-06-23 ENCOUNTER — Telehealth: Payer: Self-pay | Admitting: Pulmonary Disease

## 2020-06-23 ENCOUNTER — Other Ambulatory Visit: Payer: Self-pay | Admitting: *Deleted

## 2020-06-23 MED ORDER — FLUTICASONE-SALMETEROL 250-50 MCG/DOSE IN AEPB: 1.0000 | INHALATION_SPRAY | Freq: Two times a day (BID) | RESPIRATORY_TRACT | 0 refills | Status: DC

## 2020-06-23 MED ORDER — FLUTICASONE-SALMETEROL 250-50 MCG/DOSE IN AEPB: 1.0000 | INHALATION_SPRAY | Freq: Two times a day (BID) | RESPIRATORY_TRACT | 1 refills | Status: DC

## 2020-06-23 NOTE — Telephone Encounter (Signed)
Called and spoke to patient, who is requesting to switch Advair 115 HFA to Advair diskus.  Dr. Patsey Berthold, please advise. Thanks

## 2020-06-23 NOTE — Patient Outreach (Signed)
[place] [facility]) Care Management  06/23/2020  Bari Leib 16-Jul-1927 [id number]   Subjective: Telephone call to patient's home  / mobile number, no answer, left HIPAA compliant voicemail message, and requested call back.    Objective:Per KPN (Knowledge Performance Now, point of care tool), chart review,patient hospitalized 02/12/2020- 02/17/2020 for pneumonia and 12/31/2019 - 01/04/2020 for sepsis secondary to pneumonia at [facility], [place]( was visiting her son). Patient also has a history of hypertension, COPD, pneumonia, Anemia secondary to chronic disease,Atrial fibrillation with rapid ventricular response,Asthma,Basal cell carcinoma of skin,Diastolic dysfunction,Hiatal hernia,Pulmonary embolism on left, andkidney stones.    Assessment: Received Medicare MD Referral on 02/27/2020. Referral source: [facility],  NursePractitioner. Referral reason: frequent hospital visit,Diagnoses of COPD/ Pneumonia.Screening follow up completed andwill follow up to assess forcare management needs.      Plan:RNCM has  sent unsuccessful outreach  letter and [facility] pamphlet. Newly assigned RNCM will call patient for4th telephone outreach attempt, within21business days,assess for CM needs, and will proceed with case closure, after 4th unsuccessful outreach call.       Itay Mella H. Annia Friendly, BSN, [place] Management [facility] Telephonic CM Phone: [phone] Fax: [phone]

## 2020-06-23 NOTE — Telephone Encounter (Signed)
Spoke to patient and verified that she wants Diskus va HFA. Patient stated that Diskus is easier for her to use. Rx for Advair 250 has been sent to preferred pharmacy. Nothing further needed.

## 2020-06-23 NOTE — Telephone Encounter (Signed)
Her message says that she wanted HFA but if she indeed wants to switch to Advair diskus the dose would be t the 250/50 dose 1 puff twice a day.  She would have to make sure she rinses her mouth well after use.

## 2020-06-25 DIAGNOSIS — M5416 Radiculopathy, lumbar region: Secondary | ICD-10-CM | POA: Diagnosis not present

## 2020-06-25 DIAGNOSIS — M25519 Pain in unspecified shoulder: Secondary | ICD-10-CM | POA: Diagnosis not present

## 2020-06-30 DIAGNOSIS — M19012 Primary osteoarthritis, left shoulder: Secondary | ICD-10-CM | POA: Diagnosis not present

## 2020-07-01 DIAGNOSIS — D3131 Benign neoplasm of right choroid: Secondary | ICD-10-CM | POA: Diagnosis not present

## 2020-07-05 ENCOUNTER — Other Ambulatory Visit: Payer: Self-pay | Admitting: Cardiovascular Disease

## 2020-07-12 DIAGNOSIS — M4854XA Collapsed vertebra, not elsewhere classified, thoracic region, initial encounter for fracture: Secondary | ICD-10-CM | POA: Diagnosis not present

## 2020-07-12 DIAGNOSIS — M4856XA Collapsed vertebra, not elsewhere classified, lumbar region, initial encounter for fracture: Secondary | ICD-10-CM | POA: Diagnosis not present

## 2020-07-14 ENCOUNTER — Ambulatory Visit: Payer: Self-pay | Admitting: *Deleted

## 2020-07-14 DIAGNOSIS — M25519 Pain in unspecified shoulder: Secondary | ICD-10-CM | POA: Diagnosis not present

## 2020-07-14 DIAGNOSIS — G894 Chronic pain syndrome: Secondary | ICD-10-CM | POA: Diagnosis not present

## 2020-07-14 DIAGNOSIS — M5416 Radiculopathy, lumbar region: Secondary | ICD-10-CM | POA: Diagnosis not present

## 2020-07-14 DIAGNOSIS — M545 Low back pain, unspecified: Secondary | ICD-10-CM | POA: Diagnosis not present

## 2020-07-19 ENCOUNTER — Other Ambulatory Visit: Payer: Self-pay | Admitting: *Deleted

## 2020-07-19 MED ORDER — DILTIAZEM HCL 60 MG PO TABS: 60.0000 mg | ORAL_TABLET | Freq: Three times a day (TID) | ORAL | 0 refills | Status: DC

## 2020-07-21 DIAGNOSIS — M4856XA Collapsed vertebra, not elsewhere classified, lumbar region, initial encounter for fracture: Secondary | ICD-10-CM | POA: Diagnosis not present

## 2020-07-21 DIAGNOSIS — M545 Low back pain, unspecified: Secondary | ICD-10-CM | POA: Diagnosis not present

## 2020-07-21 DIAGNOSIS — M48061 Spinal stenosis, lumbar region without neurogenic claudication: Secondary | ICD-10-CM | POA: Diagnosis not present

## 2020-07-26 ENCOUNTER — Other Ambulatory Visit: Payer: Self-pay | Admitting: Internal Medicine

## 2020-07-29 ENCOUNTER — Other Ambulatory Visit: Payer: Self-pay | Admitting: *Deleted

## 2020-07-29 DIAGNOSIS — S22000A Wedge compression fracture of unspecified thoracic vertebra, initial encounter for closed fracture: Secondary | ICD-10-CM | POA: Diagnosis not present

## 2020-07-29 NOTE — Patient Outreach (Signed)
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  Talked with Mrs. Robidoux today. She is doing well despite having had a T12 compression fracture. She is "taking it easy" to allow herself to heal. She is using a walker at this time. Her pain is controlled with APAP or an occassional hydrocodone/apap 5/325 mg hs.  She resides with her daughter and son-in-law. She visits her son in [place] for several weeks or months. She also travels out of state for pleasure often.  Note her HCPOA and Living Will are not in Epic. She has previously wanted to be a full code.  Outpatient Encounter Medications as of 07/29/2020  Medication Sig  . acetaminophen (TYLENOL) 500 MG tablet Take 500 mg by mouth every 4 (four) hours as needed for mild pain.   Marland Kitchen albuterol (VENTOLIN HFA) 108 (90 Base) MCG/ACT inhaler Inhale 2 puffs into the lungs every 4 (four) hours as needed for wheezing or shortness of breath.  Marland Kitchen alendronate (FOSAMAX) 70 MG tablet TAKE 1 TABLET EVERY 7 DAYS WITH A FULL GLASS OF WATER ON AN EMPTY STOMACH  . amiodarone (PACERONE) 200 MG tablet Take 1 tablet (200 mg total) by mouth daily.  . benzonatate (TESSALON) 200 MG capsule Take 1 capsule (200 mg total) by mouth 3 (three) times daily as needed for cough.  . Cholecalciferol (VITAMIN D) 2000 units CAPS Take 1 capsule by mouth daily.  Marland Kitchen diltiazem (CARDIZEM) 60 MG tablet Take 1 tablet (60 mg total) by mouth 3 (three) times daily.  Marland Kitchen docusate sodium (COLACE) 100 MG capsule Take 100-200 mg by mouth 2 (two) times daily. Take 200 mg by mouth in the morning and 100 mg by mouth at bedtime.  Marland Kitchen ELIQUIS 5 MG TABS tablet TAKE 1 TABLET TWICE A DAY  . escitalopram (LEXAPRO) 5 MG tablet TAKE 1 TABLET DAILY  . fexofenadine (ALLEGRA) 180 MG tablet Take 180 mg by mouth daily.  . Fluticasone-Salmeterol (ADVAIR DISKUS) 250-50  MCG/DOSE AEPB Inhale 1 puff into the lungs in the morning and at bedtime.  . furosemide (LASIX) 20 MG tablet TAKE 1 TABLET DAILY  . gabapentin (NEURONTIN) 300 MG capsule Take 300 mg by mouth 2 (two) times daily.   Donnie Aho (GLUCOSAMINE MSM COMPLEX) TABS Take 1 tablet by mouth 2 (two) times daily.  . Misc Natural Products (TART CHERRY ADVANCED) CAPS Take 1 capsule by mouth 2 (two) times daily.   . Multiple Vitamins-Minerals (ONE DAILY MULTIVITAMIN WOMEN PO) Take 1 tablet by mouth daily.  Marland Kitchen omeprazole (PRILOSEC) 40 MG capsule TAKE 1 CAPSULE DAILY  . polyethylene glycol (MIRALAX / GLYCOLAX) packet Take 17 g by mouth daily as needed for mild constipation.  . simvastatin (ZOCOR) 20 MG tablet Take 1 tablet (20 mg total) by mouth daily.  . TURMERIC PO Take 300 mg by mouth at bedtime.   . [DISCONTINUED] Fluticasone-Salmeterol (ADVAIR DISKUS) 250-50 MCG/DOSE AEPB Inhale 1 puff into the lungs in the morning and at bedtime.   No facility-administered encounter medications on file as of 07/29/2020.   Fall Risk  07/29/2020 03/12/2020 11/10/2019 10/16/2018 08/14/2018  Falls in the past year? _0 Comment - - - - Emmi Telephone Survey: data to providers prior to load  Number falls in past yr: 0 1 0 1 1  Comment - - - - Emmi Telephone Survey Actual Response = 5  Injury with  Fall? 1 1 0 0 0  Comment - - - - -  Risk for fall due to : History of fall(s);Medication side effect History of fall(s);Impaired balance/gait - - -  Follow up Falls evaluation completed Falls evaluation completed;Education provided;Falls prevention discussed - - -   Depression screen [facility] [place] 2/9 07/29/2020 03/12/2020 11/10/2019 10/16/2018 08/06/2018  Decreased Interest 0 0 0 0 0  Down, Depressed, Hopeless 0 0 0 0 0  PHQ - 2 Score 0 0 0 0 0  Altered sleeping - - - 1 -  Tired, decreased energy - - - 1 -  Change in appetite - - - 0 -  Feeling bad or failure about yourself  - - - 0 -  Trouble concentrating - - - 0 -   Moving slowly or fidgety/restless - - - 0 -  Suicidal thoughts - - - 0 -  PHQ-9 Score - - - 2 -  Difficult doing work/chores - - - Not difficult at all -   Goals    . Cope with Pain     Follow Up Date 09/03/20  - learn relaxation techniques   Why is this important?   Living with back pain and enjoying your life may be hard.  Feelings, such as depression or anger, can make your pain worse.  Learning ways to cope may help you find some relief from the pain.    Notes:     . Keep Pain Under Control     Follow Up Date 09/03/20   - plan exercise or activity when pain is best controlled - prioritize tasks for the day - stay active - use ice or heat for pain relief - work slower and less intense when having pain    Why is this important?   Day-to-day life can be hard when you have back pain.  Pain medicine is just one piece of the treatment puzzle. There are many things you can do to manage pain and keep your back strong.   Lifestyle changes, like stopping smoking and eating foods with Vitamin D and calcium, keep your bones and muscles healthy. Your back is better when it is supported by strong muscles.  You can try these action steps to help you manage your pain.     Notes:     Marland Kitchen Maintain physical activity     Starting 12/10/2015, I will continue to do [facility] at least 2 times per week.     . [place]ain My Quality of Life     Follow Up Date 09/03/20   - complete a living will  - Update actions in case of cardiac/respiratory arrest.   Why is this important?   Having a long-term illness can be scary.  It can also be stressful for you and your caregiver.  These steps may help.    Notes:     . Prevent Falls and Broken Bones     Follow Up Date 12/24/19   - always wear shoes or slippers with non-slip sole - install bathroom grab bars - keep cell phone with me always - make an emergency alert plan in case I fall - use a cane or walker    Why is this important?   When you  fall, there are 3 things that control if a bone breaks or not.  These are the fall itself, how hard and the direction that you fall and how fragile your bones are.  Preventing falls is very important for you because  of fragile bones.     Notes:     . Protect My Health     Follow Up Date 12/24/19   - schedule appointment for flu shot - schedule and keep appointment for annual check-up    Why is this important?   Screening tests can find diseases early when they are easier to treat.  Your doctor or nurse will talk with you about which tests are important for you.  Getting shots for common diseases like the flu and shingles will help prevent them.     Notes:     . Track and Manage Symptoms     Follow Up Date 09/03/20   - begin a heart failure diary - develop a rescue plan - follow rescue plan if symptoms flare-up - know when to call the doctor    Why is this important?   You will be able to handle your symptoms better if you keep track of them.  Making some simple changes to your lifestyle will help.  Eating healthy is one thing you can do to take good care of yourself.    Notes:        Discussed need to review her wishes for having CPR considering her osteoporosis and recent compression fx. Educated about need to compress chest deeply to pump the heart. This can be very traumatic for an older body with osteoporosis. Requested that she and her daughter discuss this.   Offered to talk with her daughter about anything we discussed today.  NP to call her again in one month.  Eulah Pont. Myrtie Neither, MSN, [facility] Gerontological Nurse Practitioner [facility] Care Management [phone]

## 2020-08-06 ENCOUNTER — Other Ambulatory Visit
Admission: RE | Admit: 2020-08-06 | Discharge: 2020-08-06 | Disposition: A | Discharge status: Home / Self Care | Payer: Medicare Other | Source: Ambulatory Visit | Attending: Cardiovascular Disease | Admitting: Cardiovascular Disease

## 2020-08-06 DIAGNOSIS — I480 Paroxysmal atrial fibrillation: Secondary | ICD-10-CM | POA: Insufficient documentation

## 2020-08-06 DIAGNOSIS — I10 Essential (primary) hypertension: Secondary | ICD-10-CM | POA: Diagnosis not present

## 2020-08-06 DIAGNOSIS — I5032 Chronic diastolic (congestive) heart failure: Secondary | ICD-10-CM | POA: Insufficient documentation

## 2020-08-06 DIAGNOSIS — I2510 Atherosclerotic heart disease of native coronary artery without angina pectoris: Secondary | ICD-10-CM | POA: Insufficient documentation

## 2020-08-06 DIAGNOSIS — E782 Mixed hyperlipidemia: Secondary | ICD-10-CM | POA: Diagnosis not present

## 2020-08-06 DIAGNOSIS — Z7901 Long term (current) use of anticoagulants: Secondary | ICD-10-CM | POA: Insufficient documentation

## 2020-08-06 DIAGNOSIS — R42 Dizziness and giddiness: Secondary | ICD-10-CM | POA: Diagnosis not present

## 2020-08-06 LAB — BASIC METABOLIC PANEL
Anion gap: 12 (ref 5–15)
BUN: 19 mg/dL (ref 8–23)
CO2: 26 mmol/L (ref 22–32)
Calcium: 9.3 mg/dL (ref 8.9–10.3)
Chloride: 100 mmol/L (ref 98–111)
Creatinine, Ser: 1.23 mg/dL — ABNORMAL HIGH (ref 0.44–1.00)
GFR, Estimated: 41 mL/min — ABNORMAL LOW (ref >60)
Glucose, Bld: 103 mg/dL — ABNORMAL HIGH (ref 70–99)
Potassium: 4.4 mmol/L (ref 3.5–5.1)
Sodium: 138 mmol/L (ref 135–145)

## 2020-08-09 ENCOUNTER — Telehealth: Payer: Self-pay | Admitting: *Deleted

## 2020-08-09 MED ORDER — FUROSEMIDE 20 MG PO TABS: ORAL_TABLET | ORAL | 0 refills | Status: DC

## 2020-08-09 NOTE — Telephone Encounter (Signed)
-----   Message from Minna Merritts, MD sent at 08/08/2020  2:35 PM EST ----- Lab work Slight climbing renal function Would consider holding the Lasix on weekends Continue Monday through Friday

## 2020-08-09 NOTE — Telephone Encounter (Signed)
The patient has been notified of the result and verbalized understanding.  All questions were answered. Per Dr. Donivan Scull recc pt will HOLD Lasix every Saturday and Sunday and will take 20mg  (1 tab) only weekdays: Monday through Friday. Med list updated. Pt repeated new med instructions back to nurse to confirm understanding. Pt also moved next appt back from 11/02/20 to 11/29/20 d/t she is out of town for next few months.  Darlyne Russian, RN 08/09/2020 9:16 AM

## 2020-08-13 DIAGNOSIS — Z23 Encounter for immunization: Secondary | ICD-10-CM | POA: Diagnosis not present

## 2020-08-13 DIAGNOSIS — M810 Age-related osteoporosis without current pathological fracture: Secondary | ICD-10-CM | POA: Diagnosis not present

## 2020-08-13 DIAGNOSIS — M818 Other osteoporosis without current pathological fracture: Secondary | ICD-10-CM | POA: Diagnosis not present

## 2020-08-21 ENCOUNTER — Other Ambulatory Visit: Payer: Self-pay | Admitting: Internal Medicine

## 2020-08-21 ENCOUNTER — Other Ambulatory Visit: Payer: Self-pay | Admitting: Cardiovascular Disease

## 2020-08-23 DIAGNOSIS — M5416 Radiculopathy, lumbar region: Secondary | ICD-10-CM | POA: Diagnosis not present

## 2020-08-23 DIAGNOSIS — G894 Chronic pain syndrome: Secondary | ICD-10-CM | POA: Diagnosis not present

## 2020-08-23 DIAGNOSIS — M545 Low back pain, unspecified: Secondary | ICD-10-CM | POA: Diagnosis not present

## 2020-08-23 DIAGNOSIS — M4854XD Collapsed vertebra, not elsewhere classified, thoracic region, subsequent encounter for fracture with routine healing: Secondary | ICD-10-CM | POA: Diagnosis not present

## 2020-08-23 NOTE — Telephone Encounter (Signed)
Rx request sent to pharmacy.  

## 2020-08-25 ENCOUNTER — Other Ambulatory Visit: Payer: Self-pay | Admitting: Internal Medicine

## 2020-08-25 ENCOUNTER — Other Ambulatory Visit: Payer: Self-pay

## 2020-08-25 ENCOUNTER — Telehealth: Payer: Self-pay | Admitting: Cardiovascular Disease

## 2020-08-25 MED ORDER — DILTIAZEM HCL 60 MG PO TABS: 60.0000 mg | ORAL_TABLET | Freq: Three times a day (TID) | ORAL | 0 refills | Status: DC

## 2020-08-25 NOTE — Telephone Encounter (Signed)
*  STAT* If patient is at the pharmacy, call can be transferred to refill team.   1. Which medications need to be refilled? (please list name of each medication and dose if known) Diltiazem   2. Which pharmacy/location (including street and city if local pharmacy) is medication to be sent to? [facility]  3. Do they need a 30 day or 90 day supply? [place]

## 2020-08-25 NOTE — Telephone Encounter (Signed)
diltiazem (CARDIZEM) 60 MG tablet 90 tablet 0 08/25/2020    Sig - Route: Take 1 tablet (60 mg total) by mouth 3 (three) times daily. - Oral   Sent to pharmacy as: diltiazem (CARDIZEM) 60 MG tablet   E-Prescribing Status: Receipt confirmed by pharmacy (08/25/2020  3:48 PM EST)

## 2020-08-26 ENCOUNTER — Telehealth: Payer: Self-pay | Admitting: Cardiovascular Disease

## 2020-08-26 ENCOUNTER — Telehealth: Payer: Self-pay

## 2020-08-26 NOTE — Telephone Encounter (Signed)
I spoke with Clarene Critchley (DPR signed) she said she is going to take pt to [facility] tomorrow; Clarene Critchley said pt has combo of dry and prod cough; no CP. Clarene Critchley had contacted cardiology due to pt having afib also. I advised should take pt to UC or ED for eval and possible testing. Clarene Critchley said there is no sense of urgency at this time; Clarene Critchley was surprised that pt could not be seen at [facility] on 08/27/20. I apologized and explained that we have no available appts on 08/27/20 and due to guidelines would not be able to bring pt into office if was available appt due to respiratory issues and Clarene Critchley agreed pt needs a face to face appt for someone to listen to pts lungs and do CXR with hx of pneumonia. UC & ED precautions given and Clarene Critchley voiced understanding.sending note to Avie Echevaria NP.

## 2020-08-26 NOTE — Telephone Encounter (Signed)
Patient's daughter states for the past few days patient has had a cough that she cannot get under control. States she tried to reach out to her PCP and they recommended she contact her cardiologist. States while she was coughing so much this morning she had an episode of afib. States she is not currently in afib as of now.

## 2020-08-26 NOTE — Telephone Encounter (Signed)
[facility] - Client TELEPHONE ADVICE RECORD [facility] Patient Name: MAECI KALBFLEISCH Gender: Female DOB: 04-12-1927 Age: 86 Y 2 M 18 D Return Phone Number: [phone] (Primary) Address: City/State/Zip: [place] [address] Client [facility] - Client Client Site [place] - Day Physician Webb Silversmith - NP Contact Type Call Who Is Calling Patient / Member / Family / Caregiver Call Type Triage / Clinical Caller Name Joneen Boers Relationship To Patient Daughter Return Phone Number [phone] (Primary) Chief Complaint Cough Reason for Call Symptomatic / Request for [place] states that her mother is coughing. She has a virtual appointment on 08-31-20. Translation No Nurse Assessment Nurse: Self, RN, Nira Conn Date/Time (Eastern Time): 08/26/2020 11:25:23 AM Confirm and document reason for call. If symptomatic, describe symptoms. ---Cough , No fever. Caller says she has Pneumonia easily . Does the patient have any new or worsening symptoms? ---Yes Will a triage be completed? ---Yes Related visit to physician within the last 2 weeks? ---No Does the PT have any chronic conditions? (i.e. diabetes, asthma, this includes High risk factors for pregnancy, etc.) ---Yes List chronic conditions. ---A Fib , OA , HTN Is this a behavioral health or substance abuse call? ---No Guidelines Guideline Title Affirmed Question Affirmed Notes Nurse Date/Time (Eastern Time) Cough - Acute Productive [1] MILD difficulty breathing (e.g., minimal/ no SOB at rest, SOB with walking, pulse <100) AND [2] still present when not coughing Self, RN, Nira Conn 08/26/2020 11:26:44 AM Disp. Time Eilene Ghazi Time) Disposition Final User 08/26/2020 11:28:43 AM See HCP within 4 Hours (or PCP triage) Yes Self, RN, Heather PLEASE NOTE: All timestamps contained within this report are represented as [place]  Standard Time. CONFIDENTIALTY NOTICE: This fax transmission is intended only for the addressee. It contains information that is legally privileged, confidential or otherwise protected from use or disclosure. If you are not the intended recipient, you are strictly prohibited from reviewing, disclosing, copying using or disseminating any of this information or taking any action in reliance on or regarding this information. If you have received this fax in error, please notify us immediately by telephone so that we can arrange for its return to [place]. Phone: [phone], Toll-Free: [phone], Fax: [phone] Page: 2 of 2 Call Id: [id number] [place] Disagree/Comply Comply Caller Understands Yes PreDisposition Call Doctor Care Advice Given Per Guideline SEE HCP (OR PCP TRIAGE) WITHIN 4 HOURS: * IF OFFICE WILL BE OPEN: You need to be seen within the next 3 or 4 hours. Call your doctor (or NP/PA) now or as soon as the office opens. CALL BACK IF: * You become worse CARE ADVICE given per Cough - Acute Productive (Adult) guideline. Referrals Warm transfer to backline

## 2020-08-26 NOTE — Telephone Encounter (Signed)
I called and spoke with the patient's daughter, Julie Davies (ok per DPR). Per Julie Davies, the patient has had a cough she cannot get rid of the last few days.  She called the patient's PCP today to have her seen and they advised they had no appointments, to please call cardiology.   Per Julie Davies, she does have a history of pneumonia.  I have advised Julie Davies that we cannot evaluate the patient's cough and treat her acutely for this. I have asked that she take the patient to Urgent Care for further evaluation. She is aware the patient may be COVID & Flu tested.  Julie Davies voices understanding of the above recommendations and is agreeable.

## 2020-08-26 NOTE — Telephone Encounter (Signed)
noted 

## 2020-08-27 ENCOUNTER — Other Ambulatory Visit: Payer: Self-pay

## 2020-08-27 ENCOUNTER — Ambulatory Visit (INDEPENDENT_AMBULATORY_CARE_PROVIDER_SITE_OTHER): Payer: Medicare Other

## 2020-08-27 ENCOUNTER — Ambulatory Visit
Admission: EM | Admit: 2020-08-27 | Discharge: 2020-08-27 | Disposition: A | Discharge status: Home / Self Care | Payer: Medicare Other | Attending: Emergency Medicine | Admitting: Emergency Medicine

## 2020-08-27 DIAGNOSIS — R059 Cough, unspecified: Secondary | ICD-10-CM

## 2020-08-27 DIAGNOSIS — Z0189 Encounter for other specified special examinations: Secondary | ICD-10-CM | POA: Diagnosis not present

## 2020-08-27 MED ORDER — BENZONATATE 100 MG PO CAPS: 100.0000 mg | ORAL_CAPSULE | Freq: Three times a day (TID) | ORAL | 0 refills | Status: DC

## 2020-08-27 NOTE — ED Provider Notes (Signed)
Julie Davies    CSN: [id number] Arrival date & time: 08/27/20  0813      History   Chief Complaint Chief Complaint  Patient presents with  . Cough    HPI Julie Davies is a 86 y.o. female.   Daughter, patient presents with a cough x2 to 3 weeks.  The cough is occasionally productive of clear beige sputum.  She also reports shortness of breath with exertion.  She denies fever, sore throat, diarrhea, or other symptoms.  Her medical history includes hypertension, asthma, pulmonary embolism, paroxysmal A. fib, CHF diastolic dysfunction, pulmonary nodules, CAD.  The history is provided by the patient, a relative and medical records.    Past Medical History:  Diagnosis Date  . Allergy   . Arthritis   . Asthma   . Basal cell carcinoma of skin   . Chicken pox   . Diastolic dysfunction    a. TTE 5/19: EF 60-65%, mild focal basal hypertrophy, no RWMA, Gr1DD, mod dilated LA, RV cavity size nl with nl RVSF, PASP 40-45, mildly dilated IVC  . GERD (gastroesophageal reflux disease)   . Hiatal hernia    a. noted on Chest CT 03/2017.  Marland Kitchen History of cardiovascular stress test    a. 01/2018 MV: No ischemia. Fixed antsept, apical inferior defects - likely 2/2 GI uptake artifact (attenuation corrected images not available). Low risk.  Marland Kitchen History of kidney stones   . PAF (paroxysmal atrial fibrillation) ([place])    a. diagnosed 5/19; b. CHADS2VASc = 4 (age x 2, vascular disease - noted on CT chest 7/18, female)--> Eliquis; c. 01/2018 Zio Monitor: 14% Afib.  Marland Kitchen Postural dizziness with presyncope    a. TTE 8/18: EF 55-60%, nl WM, Gr1DD, mild MR, mildly dilated LA  . Pulmonary embolism on left [facility])    a. in the setting of right TKA; b. treated with Coumadin  . Pulmonary nodules    a. 03/2017 Chest CT: resolution of RUL ground glass nodule. Stable, numerous small bilat pulm nodules since 2016-->benign.    Patient Active Problem List   Diagnosis Date Noted  . Mood disorder ([place]) 10/17/2018    . Coronary artery calcification seen on CAT scan 07/15/2018  . PAF (paroxysmal atrial fibrillation) ([place]) 07/15/2018  . Chronic diastolic CHF (congestive heart failure) ([place]) 07/15/2018  . Asthma 04/17/2017  . Age-related osteoporosis without current pathological fracture 10/16/2016  . Pulmonary nodules 03/12/2015  . Essential hypertension 09/10/2014  . HLD (hyperlipidemia) 09/10/2014  . Gastroesophageal reflux disease without esophagitis 09/10/2014  . Arthritis 09/10/2014    Past Surgical History:  Procedure Laterality Date  . APPENDECTOMY  1935  . DILATION AND CURETTAGE OF UTERUS  1975  . NEPHRECTOMY Right 1975  . REPLACEMENT TOTAL KNEE BILATERAL  2013  . VEIN LIGATION AND STRIPPING Left 1971    OB History   No obstetric history on file.      Home Medications    Prior to Admission medications   Medication Sig Start Date End Date Taking? Authorizing Provider  HYDROcodone-acetaminophen (NORCO/VICODIN) 5-325 MG tablet Take 1 tablet by mouth every 6 (six) hours as needed for moderate pain.   Yes [provider]  acetaminophen (TYLENOL) 500 MG tablet Take 500 mg by mouth every 4 (four) hours as needed for mild pain.     [provider]  albuterol (VENTOLIN HFA) 108 (90 Base) MCG/ACT inhaler Inhale 2 puffs into the lungs every 4 (four) hours as needed for wheezing or shortness of breath. 09/25/19  Tower, Wynelle Fanny, MD  alendronate (FOSAMAX) 70 MG tablet TAKE 1 TABLET EVERY 7 DAYS WITH A FULL GLASS OF WATER ON AN EMPTY STOMACH 12/16/19   Jearld Fenton, NP  amiodarone (PACERONE) 200 MG tablet Take 1 tablet (200 mg total) by mouth daily. 03/23/20   Loel Dubonnet, NP  benzonatate (TESSALON) 100 MG capsule Take 1 capsule (100 mg total) by mouth every 8 (eight) hours. 08/27/20   Sharion Balloon, NP  Cholecalciferol (VITAMIN D) 2000 units CAPS Take 1 capsule by mouth daily.    [provider]  diltiazem (CARDIZEM) 60 MG tablet Take 1 tablet (60 mg total) by  mouth 3 (three) times daily. 08/25/20   Minna Merritts, MD  docusate sodium (COLACE) 100 MG capsule Take 100-200 mg by mouth 2 (two) times daily. Take 200 mg by mouth in the morning and 100 mg by mouth at bedtime.    [provider]  ELIQUIS 5 MG TABS tablet TAKE 1 TABLET TWICE A DAY 04/21/20   Minna Merritts, MD  escitalopram (LEXAPRO) 5 MG tablet TAKE 1 TABLET DAILY 01/12/20   Jearld Fenton, NP  fexofenadine (ALLEGRA) 180 MG tablet Take 180 mg by mouth daily.    [provider]  Fluticasone-Salmeterol (ADVAIR DISKUS) 250-50 MCG/DOSE AEPB Inhale 1 puff into the lungs in the morning and at bedtime. 06/23/20 06/23/21  Tyler Pita, MD  furosemide (LASIX) 20 MG tablet Take 20mg  (1 tab) daily only weekdays, Monday through Friday and HOLD every Saturday and Sunday. 08/09/20   Minna Merritts, MD  gabapentin (NEURONTIN) 300 MG capsule Take 300 mg by mouth 2 (two) times daily.  06/10/15   [provider]  Glucos-MSM-C-Mn-Ginger-Willow (GLUCOSAMINE MSM COMPLEX) TABS Take 1 tablet by mouth 2 (two) times daily.    [provider]  Misc Natural Products (TART CHERRY ADVANCED) CAPS Take 1 capsule by mouth 2 (two) times daily.     [provider]  Multiple Vitamins-Minerals (ONE DAILY MULTIVITAMIN WOMEN PO) Take 1 tablet by mouth daily.    [provider]  omeprazole (PRILOSEC) 40 MG capsule TAKE 1 CAPSULE DAILY 07/27/20   Jearld Fenton, NP  polyethylene glycol (MIRALAX / GLYCOLAX) packet Take 17 g by mouth daily as needed for mild constipation. 02/02/18   Nicholes Mango, MD  simvastatin (ZOCOR) 20 MG tablet TAKE 1 TABLET DAILY 08/22/20   Jearld Fenton, NP  TURMERIC PO Take 300 mg by mouth at bedtime.     [provider]    Family History Family History  Problem Relation Age of Onset  . Heart disease Father   . Stroke Maternal Uncle   . Heart disease Maternal Aunt   . Cancer Neg Hx   . Diabetes Neg Hx     Social History Social  History   Tobacco Use  . Smoking status: Never Smoker  . Smokeless tobacco: Never Used  Vaping Use  . Vaping Use: Never used  Substance Use Topics  . Alcohol use: Yes    Alcohol/week: 0.0 standard drinks    Comment: rare--wine  . Drug use: No     Allergies   Tramadol   Review of Systems Review of Systems  Constitutional: Negative for chills and fever.  HENT: Negative for ear pain and sore throat.   Eyes: Negative for pain and visual disturbance.  Respiratory: Positive for cough and shortness of breath.   Cardiovascular: Negative for chest pain and palpitations.  Gastrointestinal: Negative for abdominal  pain, diarrhea and vomiting.  Genitourinary: Negative for dysuria and hematuria.  Musculoskeletal: Negative for arthralgias and back pain.  Skin: Negative for color change and rash.  Neurological: Negative for seizures and syncope.  All other systems reviewed and are negative.    Physical Exam Triage Vital Signs ED Triage Vitals  Enc Vitals Group     BP      Pulse      Resp      Temp      Temp src      SpO2      Weight      Height      Head Circumference      Peak Flow      Pain Score      Pain Loc      Pain Edu?      Excl. in [place]?    No data found.  Updated Vital Signs BP 127/72 (BP Location: Right Arm)   Pulse 69   Temp 97.7 F (36.5 C) (Oral)   Resp 20   Ht 5\' 7"  (1.702 m)   Wt 176 lb (79.8 kg)   SpO2 96%   BMI 27.57 kg/m   Visual Acuity Right Eye Distance:   Left Eye Distance:   Bilateral Distance:    Right Eye Near:   Left Eye Near:    Bilateral Near:     Physical Exam Vitals and nursing note reviewed.  Constitutional:      General: She is not in acute distress.    Appearance: She is well-developed. She is not ill-appearing.  HENT:     Head: Normocephalic and atraumatic.     Right Ear: Tympanic membrane normal.     Left Ear: Tympanic membrane normal.     Nose: Nose normal.     Mouth/Throat:     Mouth: Mucous membranes are  moist.     Pharynx: Oropharynx is clear.  Eyes:     Conjunctiva/sclera: Conjunctivae normal.  Cardiovascular:     Rate and Rhythm: Normal rate and regular rhythm.     Heart sounds: Normal heart sounds.  Pulmonary:     Effort: Pulmonary effort is normal. No respiratory distress.     Breath sounds: Normal breath sounds.     Comments: Diminished lung sounds L>R. Abdominal:     Palpations: Abdomen is soft.     Tenderness: There is no abdominal tenderness. There is no guarding or rebound.  Musculoskeletal:     Cervical back: Neck supple.  Skin:    General: Skin is warm and dry.     Findings: No rash.  Neurological:     General: No focal deficit present.     Mental Status: She is alert and oriented to person, place, and time.     Gait: Gait normal.  Psychiatric:        Mood and Affect: Mood normal.        Behavior: Behavior normal.      UC Treatments / Results  Labs (all labs ordered are listed, but only abnormal results are displayed) Labs Reviewed  COVID-19, FLU A+B AND RSV    EKG   Radiology DG Chest 2 View  Result Date: 08/27/2020 CLINICAL DATA:  Shortness of breath and productive cough for 2-3 weeks EXAM: CHEST - 2 VIEW COMPARISON:  02/24/2020 FINDINGS: Normal heart size. Aortic tortuosity. Large hiatal hernia. There is no edema, consolidation, effusion, or pneumothorax. Remote L1 compression fracture. IMPRESSION: No acute finding. Large hiatal hernia. Electronically Signed  By: Monte Fantasia M.D.   On: 08/27/2020 09:20    Procedures Procedures (including critical care time)  Medications Ordered in UC Medications - No data to display  Initial Impression / Assessment and Plan / UC Course  I have reviewed the triage vital signs and the nursing notes.  Pertinent labs & imaging results that were available during my care of the patient were reviewed by me and considered in my medical decision making (see chart for details).   Cough.  CXR negative for acute  process.  Treating with Tessalon Per[place].  Influenza, RSV, COVID pending.  Instructed patient to self quarantine until the test results are back.  Discussed symptomatic treatment including Tylenol, rest, hydration.  Instructed patient to follow up with PCP if her symptoms are not improving  Patient agrees to plan of care.    Final Clinical Impressions(s) / UC Diagnoses   Final diagnoses:  Cough     Discharge Instructions     Follow up with your primary care provider if your symptoms are not improving.        ED Prescriptions    Medication Sig Dispense Auth. Provider   benzonatate (TESSALON) 100 MG capsule Take 1 capsule (100 mg total) by mouth every 8 (eight) hours. 21 capsule Sharion Balloon, NP     PDMP not reviewed this encounter.   Sharion Balloon, NP 08/27/20 0930

## 2020-08-27 NOTE — ED Triage Notes (Signed)
Pt presents to Urgent Care with c/o worsening cough x "a few weeks." Pt reports cough is occasionally productive w/ clear to beige sputum. Pt w/ no known COVID exposure; has been vaccinated against COVID and flu.

## 2020-08-27 NOTE — Discharge Instructions (Addendum)
Follow up with your primary care provider if your symptoms are not improving.    Your COVID and Flu tests are pending.  You should self quarantine until the test results are back.

## 2020-08-29 LAB — COVID-19, FLU A+B AND RSV
Influenza A, NAA: NOT DETECTED
Influenza B, NAA: NOT DETECTED
RSV, NAA: NOT DETECTED
SARS-CoV-2, NAA: NOT DETECTED

## 2020-08-30 ENCOUNTER — Other Ambulatory Visit: Payer: Self-pay | Admitting: *Deleted

## 2020-08-30 NOTE — Patient Outreach (Signed)
[facility]) Care Management  08/30/2020  Julie Davies Jul 22, 1927 [id number]  Telephone outreach multiple co-morbidities  Julie Davies reports she is doing well. Her back is giving her less problems and her pain has subsided somewhat after having her T12 compression fx. She only has to take her narcotic pain med once or twice a week.  She denies any falls over the last month.  She is going to [place] again  She says she has a HCPOA but she needs to get a copy to her provider so that it can be in the EMR.  She has received both her flu and COVID booster vaccinations.  She has been following the HF Action plan. Weighs daily, current 175# and stable. She may have some SOB on exertion but not at rest or with her activities in the home. Her furosemide has been reduced to only 5 days a week M-F. She will have some edema but does wear compression stockings.  She will be leaving to go to [place] to spend the winter with her son and be back in March.  We agreed we will talk again then.  Goals    . Maintain physical activity     Starting 12/10/2015, I will continue to do [facility] at least 2 times per week.   THN comment 08/30/20, Julie Davies continues to partcipate in her [place] classes when she can.    . [place] My Quality of Life     Follow Up Date 09/03/20   - complete a living will  - Update actions in case of cardiac/respiratory arrest.   Why is this important?   Having a long-term illness can be scary.  It can also be stressful for you and your caregiver.  These steps may help.    Notes:  08/30/20 Julie Davies says she has a HCPOA and LW but they are not in the EMR. Have encouraged her to provide a copy to her providers so this can be accessible. She says she will.    . Prevent Falls and Broken Bones     Follow Up Date 12/24/19   - always wear shoes or slippers with non-slip sole - install bathroom grab bars - keep cell phone with me always - make an  emergency alert plan in case I fall - use a cane or walker    Why is this important?   When you fall, there are 3 things that control if a bone breaks or not.  These are the fall itself, how hard and the direction that you fall and how fragile your bones are.  Preventing falls is very important for you because of fragile bones.     Notes: 08/30/20 No falls over the last 30 days. Encouraged personal safety awareness and fall precautions.    . Track and Manage Symptoms     Follow Up Date 12/23/20  - begin a heart failure diary - develop a rescue plan - follow rescue plan if symptoms flare-up - know when to call the doctor    Why is this important?   You will be able to handle your symptoms better if you keep track of them.  Making some simple changes to your lifestyle will help.  Eating healthy is one thing you can do to take good care of yourself.    Notes: Pt is following HF Action plan. She weighs daily, current wt 175, takes meds, gets some exercise, wears compression stockings. Reminded her to call her  provider early with sxs for intervention and to avoid hospitalizatioins.      Julie Davies. Myrtie Neither, MSN, [facility] Gerontological Nurse Practitioner [facility] Care Management [phone]

## 2020-08-31 ENCOUNTER — Telehealth (INDEPENDENT_AMBULATORY_CARE_PROVIDER_SITE_OTHER): Payer: Medicare Other | Admitting: Internal Medicine

## 2020-08-31 ENCOUNTER — Other Ambulatory Visit: Payer: Self-pay

## 2020-08-31 ENCOUNTER — Encounter: Payer: Self-pay | Admitting: Internal Medicine

## 2020-08-31 DIAGNOSIS — R059 Cough, unspecified: Secondary | ICD-10-CM

## 2020-08-31 DIAGNOSIS — J4530 Mild persistent asthma, uncomplicated: Secondary | ICD-10-CM | POA: Diagnosis not present

## 2020-08-31 DIAGNOSIS — I2510 Atherosclerotic heart disease of native coronary artery without angina pectoris: Secondary | ICD-10-CM | POA: Diagnosis not present

## 2020-08-31 NOTE — Patient Instructions (Signed)

## 2020-08-31 NOTE — Progress Notes (Signed)
Virtual Visit via Video Note  I connected with Julie Davies on 08/31/20 at 11:15 AM EST by a video enabled telemedicine application and verified that I am speaking with the correct person using two identifiers.  Location: Patient: Home Provider: Office  Person's participating in this video call: Webb Silversmith, NP-C and Julie Davies   I discussed the limitations of evaluation and management by telemedicine and the availability of in person appointments. The patient expressed understanding and agreed to proceed.  History of Present Illness:  Pt due for UC Followup with c/o a cough that had started 2-3 weeks prior. Respiratory panel was negative. Chest xray was negative. She was given a RX for [employer], discharged and advised to follow up with her PCP. Since discharge, she reports she has a persistent cough. The cough is productive of clear mucous. She has chronic SOB which has not been worse lately. She has chronic runny nose, not worse, but denies nasal congestion, ear pain, sore throat. She denies exposure to Covid that she is aware of. She has a history of asthma, taking Advair daily, using Albuterol as needed.    Past Medical History:  Diagnosis Date  . Allergy   . Arthritis   . Asthma   . Basal cell carcinoma of skin   . Chicken pox   . Diastolic dysfunction    a. TTE 5/19: EF 60-65%, mild focal basal hypertrophy, no RWMA, Gr1DD, mod dilated LA, RV cavity size nl with nl RVSF, PASP 40-45, mildly dilated IVC  . GERD (gastroesophageal reflux disease)   . Hiatal hernia    a. noted on Chest CT 03/2017.  Marland Kitchen History of cardiovascular stress test    a. 01/2018 MV: No ischemia. Fixed antsept, apical inferior defects - likely 2/2 GI uptake artifact (attenuation corrected images not available). Low risk.  Marland Kitchen History of kidney stones   . PAF (paroxysmal atrial fibrillation) ([place])    a. diagnosed 5/19; b. CHADS2VASc = 4 (age x 2, vascular disease - noted on CT chest 7/18, female)-->  Eliquis; c. 01/2018 Zio Monitor: 14% Afib.  Marland Kitchen Postural dizziness with presyncope    a. TTE 8/18: EF 55-60%, nl WM, Gr1DD, mild MR, mildly dilated LA  . Pulmonary embolism on left [facility])    a. in the setting of right TKA; b. treated with Coumadin  . Pulmonary nodules    a. 03/2017 Chest CT: resolution of RUL ground glass nodule. Stable, numerous small bilat pulm nodules since 2016-->benign.    Current Outpatient Medications  Medication Sig Dispense Refill  . acetaminophen (TYLENOL) 500 MG tablet Take 500 mg by mouth every 4 (four) hours as needed for mild pain.     Marland Kitchen albuterol (VENTOLIN HFA) 108 (90 Base) MCG/ACT inhaler Inhale 2 puffs into the lungs every 4 (four) hours as needed for wheezing or shortness of breath. 18 g 0  . alendronate (FOSAMAX) 70 MG tablet TAKE 1 TABLET EVERY 7 DAYS WITH A FULL GLASS OF WATER ON AN EMPTY STOMACH 12 tablet 3  . amiodarone (PACERONE) 200 MG tablet Take 1 tablet (200 mg total) by mouth daily. 90 tablet 3  . benzonatate (TESSALON) 100 MG capsule Take 1 capsule (100 mg total) by mouth every 8 (eight) hours. 21 capsule 0  . Cholecalciferol (VITAMIN D) 2000 units CAPS Take 1 capsule by mouth daily.    Marland Kitchen diltiazem (CARDIZEM) 60 MG tablet Take 1 tablet (60 mg total) by mouth 3 (three) times daily. 90 tablet 0  . docusate sodium (  COLACE) 100 MG capsule Take 100-200 mg by mouth 2 (two) times daily. Take 200 mg by mouth in the morning and 100 mg by mouth at bedtime.    Marland Kitchen ELIQUIS 5 MG TABS tablet TAKE 1 TABLET TWICE A DAY 180 tablet 1  . escitalopram (LEXAPRO) 5 MG tablet TAKE 1 TABLET DAILY 90 tablet 2  . fexofenadine (ALLEGRA) 180 MG tablet Take 180 mg by mouth daily.    . Fluticasone-Salmeterol (ADVAIR DISKUS) 250-50 MCG/DOSE AEPB Inhale 1 puff into the lungs in the morning and at bedtime. 180 each 1  . furosemide (LASIX) 20 MG tablet Take 20mg  (1 tab) daily only weekdays, Monday through Friday and HOLD every Saturday and Sunday. 90 tablet 0  . gabapentin (NEURONTIN)  300 MG capsule Take 300 mg by mouth 2 (two) times daily.     Donnie Aho (GLUCOSAMINE MSM COMPLEX) TABS Take 1 tablet by mouth 2 (two) times daily.    Marland Kitchen HYDROcodone-acetaminophen (NORCO/VICODIN) 5-325 MG tablet Take 1 tablet by mouth every 6 (six) hours as needed for moderate pain.    . Misc Natural Products (TART CHERRY ADVANCED) CAPS Take 1 capsule by mouth 2 (two) times daily.     . Multiple Vitamins-Minerals (ONE DAILY MULTIVITAMIN WOMEN PO) Take 1 tablet by mouth daily.    Marland Kitchen omeprazole (PRILOSEC) 40 MG capsule TAKE 1 CAPSULE DAILY 90 capsule 1  . polyethylene glycol (MIRALAX / GLYCOLAX) packet Take 17 g by mouth daily as needed for mild constipation. 14 each 0  . simvastatin (ZOCOR) 20 MG tablet TAKE 1 TABLET DAILY 90 tablet 0  . TURMERIC PO Take 300 mg by mouth at bedtime.      No current facility-administered medications for this visit.    Allergies  Allergen Reactions  . Tramadol Other (See Comments)    Pt states she had a syncopal episode and confusion with Elev BP.     Family History  Problem Relation Age of Onset  . Heart disease Father   . Stroke Maternal Uncle   . Heart disease Maternal Aunt   . Cancer Neg Hx   . Diabetes Neg Hx     Social History   Socioeconomic History  . Marital status: Widowed    Spouse name: Not on file  . Number of children: Not on file  . Years of education: Not on file  . Highest education level: Not on file  Occupational History  . Not on file  Tobacco Use  . Smoking status: Never Smoker  . Smokeless tobacco: Never Used  Vaping Use  . Vaping Use: Never used  Substance and Sexual Activity  . Alcohol use: Yes    Alcohol/week: 0.0 standard drinks    Comment: rare--wine  . Drug use: No  . Sexual activity: Not Currently  Other Topics Concern  . Not on file  Social History Narrative  . Not on file   Social Determinants of Health   Financial Resource Strain:   . Difficulty of Paying Living Expenses: Not on  file  Food Insecurity: No Food Insecurity  . Worried About Charity fundraiser in the Last Year: Never true  . Ran Out of Food in the Last Year: Never true  Transportation Needs: No Transportation Needs  . Lack of Transportation (Medical): No  . Lack of Transportation (Non-Medical): No  Physical Activity:   . Days of Exercise per Week: Not on file  . Minutes of Exercise per Session: Not on file  Stress:   .  Feeling of Stress : Not on file  Social Connections:   . Frequency of Communication with Friends and Family: Not on file  . Frequency of Social Gatherings with Friends and Family: Not on file  . Attends Religious Services: Not on file  . Active Member of Clubs or Organizations: Not on file  . Attends Archivist Meetings: Not on file  . Marital Status: Not on file  Intimate Partner Violence:   . Fear of Current or Ex-Partner: Not on file  . Emotionally Abused: Not on file  . Physically Abused: Not on file  . Sexually Abused: Not on file     Constitutional: Denies fever, malaise, fatigue, headache or abrupt weight changes.  HEENT: Pt reports runny nose. Denies eye pain, eye redness, ear pain, ringing in the ears, wax buildup, nasal congestion, bloody nose, or sore throat. Respiratory: Pt reports cough. Denies difficulty breathing, shortness of breath, or sputum production.   Cardiovascular: Denies chest pain, chest tightness, palpitations or swelling in the hands or feet.   No other specific complaints in a complete review of systems (except as listed in HPI above).  Observations/Objective:   Wt Readings from Last 3 Encounters:  08/27/20 176 lb (79.8 kg)  05/10/20 181 lb 8 oz (82.3 kg)  03/08/20 182 lb 9.6 oz (82.8 kg)    General: Appears her stated age, in NAD. HEENT: Head: normal shape and size;  Nose: no congestion noted ; Throat/Mouth: no hoarseness noted. Pulmonary/Chest: Normal effort. No respiratory distress.  Neurological: Alert and oriented.    BMET    Component Value Date/Time   NA 138 08/06/2020 1407   NA 144 08/13/2019 0941   K 4.4 08/06/2020 1407   CL 100 08/06/2020 1407   CO2 26 08/06/2020 1407   GLUCOSE 103 (H) 08/06/2020 1407   BUN 19 08/06/2020 1407   BUN 17 08/13/2019 0941   CREATININE 1.23 (H) 08/06/2020 1407   CALCIUM 9.3 08/06/2020 1407   GFRNONAA 41 (L) 08/06/2020 1407   GFRAA 59 (L) 08/13/2019 0941    Lipid Panel     Component Value Date/Time   CHOL 158 11/10/2019 1002   TRIG 117.0 11/10/2019 1002   HDL 56.40 11/10/2019 1002   CHOLHDL 3 11/10/2019 1002   VLDL 23.4 11/10/2019 1002   LDLCALC 78 11/10/2019 1002    CBC    Component Value Date/Time   WBC 4.1 02/24/2020 1035   RBC 3.98 02/24/2020 1035   HGB 11.6 (L) 02/24/2020 1035   HGB 12.8 08/13/2019 0941   HCT 34.9 (L) 02/24/2020 1035   HCT 38.3 08/13/2019 0941   PLT 345.0 02/24/2020 1035   PLT 222 08/13/2019 0941   MCV 87.7 02/24/2020 1035   MCV 86 08/13/2019 0941   MCH 28.8 08/13/2019 0941   MCH 29.7 01/31/2018 0627   MCHC 33.2 02/24/2020 1035   RDW 14.9 02/24/2020 1035   RDW 12.8 08/13/2019 0941   LYMPHSABS 1.8 04/12/2018 1459   MONOABS 0.9 01/28/2018 1224   EOSABS 0.2 04/12/2018 1459   BASOSABS 0.1 04/12/2018 1459    Hgb A1C Lab Results  Component Value Date   HGBA1C 5.6 01/31/2018        Assessment and Plan:  UC Follow Up for Cough:  UC notes, labs and imaging reviewed Will stop Allegra, start Zyrtec OTC Continue Advair and Albuterol Continue Tessalon Pearls No indication for steroids or abx at this time  Return precautions discussed Follow Up Instructions:    I discussed the assessment and  treatment plan with the patient. The patient was provided an opportunity to ask questions and all were answered. The patient agreed with the plan and demonstrated an understanding of the instructions.   The patient was advised to call back or seek an in-person evaluation if the symptoms worsen or if the condition fails to  improve as anticipated.   Webb Silversmith, NP

## 2020-09-23 DIAGNOSIS — I5032 Chronic diastolic (congestive) heart failure: Secondary | ICD-10-CM | POA: Diagnosis not present

## 2020-09-23 DIAGNOSIS — J410 Simple chronic bronchitis: Secondary | ICD-10-CM | POA: Diagnosis not present

## 2020-09-23 DIAGNOSIS — J4520 Mild intermittent asthma, uncomplicated: Secondary | ICD-10-CM | POA: Diagnosis not present

## 2020-09-23 DIAGNOSIS — F39 Unspecified mood [affective] disorder: Secondary | ICD-10-CM | POA: Diagnosis not present

## 2020-09-23 DIAGNOSIS — J9601 Acute respiratory failure with hypoxia: Secondary | ICD-10-CM | POA: Diagnosis not present

## 2020-10-04 ENCOUNTER — Other Ambulatory Visit: Payer: Self-pay | Admitting: Cardiovascular Disease

## 2020-10-04 NOTE — Telephone Encounter (Signed)
Rx request sent to pharmacy.  

## 2020-10-18 ENCOUNTER — Other Ambulatory Visit: Payer: Self-pay | Admitting: Cardiovascular Disease

## 2020-10-18 NOTE — Telephone Encounter (Signed)
45f, 82.3kg, scr 1.23 (08/06/20), lovw/gollan (05/10/20). refil for eliquis 5 mg granted

## 2020-10-18 NOTE — Telephone Encounter (Signed)
Please review for refill. Thanks!  

## 2020-10-19 ENCOUNTER — Telehealth: Payer: Self-pay | Admitting: Cardiovascular Disease

## 2020-10-19 NOTE — Telephone Encounter (Signed)
Received fax from [place] which states:  A prescription for a dose greater than 5 mg per day of Eliquis was ordered for your pt that is 85 years of age or older. Kidney function declines with age, and patients that are 32 years of age or older may benefit from a dose of 2.5 twice daily dose of Eliquis.   They are asking for a form to be filled out indicating if necessary to continue this dosage or to change dosage.  Treatment for PAF and Hx of PE   Will place form in Dr. Rockey Situ box in front. Routing message to Dr. Rockey Situ and anticoag for review.

## 2020-10-19 NOTE — Telephone Encounter (Signed)
Received fax from [facility] questioning current Eliquis dose of 5mg  twice daily.  Indication: PAF/HX of PE Last office visit: 05/10/20 Scr: 1.23 Age: 87 Weight: 82.1kg  Based on 2 out of 3 criteria using the above findings pt is on the appropriate Eliquis dose.  Forwarded to Dr Rockey Situ who has form from [place].

## 2020-10-21 NOTE — Telephone Encounter (Signed)
Appears that she should stay on Eliquis 5 twice daily as she is over 80 but does not have any other criteria Weight is over 60 kg and kidney function fine

## 2020-10-25 ENCOUNTER — Other Ambulatory Visit: Payer: Self-pay | Admitting: Internal Medicine

## 2020-10-25 DIAGNOSIS — M810 Age-related osteoporosis without current pathological fracture: Secondary | ICD-10-CM

## 2020-10-25 NOTE — Telephone Encounter (Signed)
Fax form for pt's Eliquis back to [facility]. Per Dr. Rockey Situ pt is to stay on current medication regiment of Eliquis 5 mg BID. No changes in therapy at this time.

## 2020-11-01 ENCOUNTER — Encounter: Payer: Self-pay | Admitting: *Deleted

## 2020-11-02 ENCOUNTER — Ambulatory Visit: Payer: Medicare Other | Admitting: Cardiovascular Disease

## 2020-11-09 ENCOUNTER — Encounter: Payer: Self-pay | Admitting: *Deleted

## 2020-11-09 NOTE — Patient Outreach (Signed)
[facility]) Care Management  11/09/2020  Rylah Fukuda 09/14/1927 [id number]  Transitioned to embedded care management program.  Eulah Pont. Myrtie Neither, MSN, [facility] Gerontological Nurse Practitioner [facility] Care Management [phone]

## 2020-11-11 ENCOUNTER — Encounter: Payer: Medicare Other | Admitting: Internal Medicine

## 2020-11-29 ENCOUNTER — Ambulatory Visit: Payer: Medicare Other | Admitting: Cardiovascular Disease

## 2020-12-03 ENCOUNTER — Other Ambulatory Visit: Payer: Self-pay | Admitting: Internal Medicine

## 2020-12-03 ENCOUNTER — Telehealth: Payer: Self-pay | Admitting: Pulmonary Disease

## 2020-12-03 NOTE — Telephone Encounter (Signed)
ATC pt- she picked up phone and hung up x 2- I was calling from restricted number due to our phones not working. Will call back 12/06/20.

## 2020-12-06 MED ORDER — FLUTICASONE-SALMETEROL 250-50 MCG/DOSE IN AEPB: 1.0000 | INHALATION_SPRAY | Freq: Two times a day (BID) | RESPIRATORY_TRACT | 1 refills | Status: AC

## 2020-12-06 NOTE — Telephone Encounter (Signed)
Rx for Advair has been sent to preferred pharmacy.  Patient is aware and voiced her understanding.  Nothing further needed at this time.

## 2020-12-13 ENCOUNTER — Ambulatory Visit: Payer: Medicare Other

## 2020-12-13 ENCOUNTER — Telehealth: Payer: Medicare Other

## 2020-12-13 NOTE — Chronic Care Management (AMB) (Signed)
  Chronic Care Management   Outreach Note  12/13/2020 Name: Julie Davies MRN: [id number] DOB: 1926/12/29  Referred by: Jearld Fenton, NP Reason for referral : Care Coordination and Chronic Care Management (Initial telephone assessment)   Successful contact was made with patient to discuss care management and care coordination services.  HIPAA verified.  Patient wishes to consider and/or discuss with PCP prior to consenting to or declining services.   Follow Up Plan: The care management team will reach out to the patient again over the next 30 days.   Quinn Plowman RN,BSN,CCM RN Case Manager [place]  [phone]

## 2020-12-17 ENCOUNTER — Telehealth: Payer: Medicare Other

## 2020-12-20 ENCOUNTER — Ambulatory Visit: Payer: Medicare Other | Admitting: *Deleted

## 2021-01-03 DIAGNOSIS — M545 Low back pain, unspecified: Secondary | ICD-10-CM | POA: Diagnosis not present

## 2021-01-03 DIAGNOSIS — M5416 Radiculopathy, lumbar region: Secondary | ICD-10-CM | POA: Diagnosis not present

## 2021-01-03 DIAGNOSIS — G894 Chronic pain syndrome: Secondary | ICD-10-CM | POA: Diagnosis not present

## 2021-01-03 DIAGNOSIS — M19012 Primary osteoarthritis, left shoulder: Secondary | ICD-10-CM | POA: Diagnosis not present

## 2021-01-03 DIAGNOSIS — M4854XD Collapsed vertebra, not elsewhere classified, thoracic region, subsequent encounter for fracture with routine healing: Secondary | ICD-10-CM | POA: Diagnosis not present

## 2021-01-06 ENCOUNTER — Encounter: Payer: Medicare Other | Admitting: Internal Medicine

## 2021-01-07 NOTE — Progress Notes (Signed)
Date:  01/10/2021   ID:  Julie Davies, DOB 09-Mar-1927, MRN [id number]  Patient Location:  [address] [place] [address]   Provider location:   [facility], [place] office  PCP:  Jearld Fenton, NP  Cardiologist:  Arvid Right [facility]  Chief Complaint  Patient presents with  . Follow-up    6 month F/U     History of Present Illness:    Julie Davies is a 87 y.o. female  past medical history of Mild COPD/asthma PE on left  HTN PAF Prior history of orthostasis Significant coronary disease noticed on chest CT scan Presents for f/u of her chest pain, shortness of breath, PAF  Last seen in clinic 04/2020 Today she presents with her daughter Some medication confusion, was not taking her Eliquis on the weekends We had previously told her to hold the Lasix on weekends  Rare atrial fib, 10 min spells  Some gait instability, uses a cane No recent falls  EKG personally reviewed by myself on todays visit Shows normal sinus rhythm rate 62 bpm interventricular conduction delay     May 2019  acute exacerbation of COPD   A. fib with RVR.   Hospitalized 12/31/19-01/04/20 for right sided pneumonia with episode of atrial fibrillation with RVR.   admitted to [facility] in [place] 02/12/20-02/17/20.  shortness of breath and weakness.  Again in atrial fibrillation with HR 116 bpm.  Her diltiazem was increased, Amiodarone and Eliquis continued.  PNA treated with IV abx. COPD exacerbation treated with nebulizers.   Home health helped with rehab She is not participating in exercise twice a week  Have more GERD, On omeprazole, gavascon Seen in the past by GI  Weight stable: weight at home 176 to 180 No edema  ZIO monitor which showed 14% A. fib burden.    longest spell lasted 14 hours and 8 minutes with an average rate of 94 bpmon eliquis 5 BID  Old lab work reviewed Total chol 151, LDL 73 CR 1.13, Bun 20   Prior CV studies:   The  following studies were reviewed today:  Stress test for chest pain 01/2018 Pharmacological myocardial perfusion imaging study with no significant ischemia Attenuation corrected images ( CT scan) not available secondary to technical issues Regions of fixed mildly decreased perfusion noted in the anteroseptal wall, apical inferior region, likely secondary to GI uptake artifact, breast attenuation artifact EF estimated at 34%, depressed ejection fraction secondary to GI uptake artifact No significant regional wall motion abnormality noted No EKG changes concerning for ischemia at peak stress or in recovery. Resting EKG with intraventricular conduction delay, APCs, which may be contributing to the mild fixed perfusion defects detailed above Low risk scan, clinical correlation suggested  Echo 01/2018,  - Left ventricle: The cavity size was normal. There was mild focal basal hypertrophy of the septum. Systolic function was normal. The estimated ejection fraction was in the range of 60% to 65%. Wall motion was normal; there were no regional wall motion abnormalities. Doppler parameters are consistent with abnormal left ventricular relaxation (grade 1 diastolic dysfunction). - Mitral valve: Calcified annulus. There was mild regurgitation. - Left atrium: The atrium was moderately dilated. - Right ventricle: The cavity size was normal. Wall thickness was normal. Systolic function was normal. - Pulmonary arteries: Systolic pressure was mildly to moderately increased, in the range of 40 mm Hg to 45 mm Hg.   Past Medical History:  Diagnosis Date  . Allergy   .  Arthritis   . Asthma   . Basal cell carcinoma of skin   . Chicken pox   . Diastolic dysfunction    a. TTE 5/19: EF 60-65%, mild focal basal hypertrophy, no RWMA, Gr1DD, mod dilated LA, RV cavity size nl with nl RVSF, PASP 40-45, mildly dilated IVC  . GERD (gastroesophageal reflux disease)   . Hiatal hernia    a. noted on  Chest CT 03/2017.  Marland Kitchen History of cardiovascular stress test    a. 01/2018 MV: No ischemia. Fixed antsept, apical inferior defects - likely 2/2 GI uptake artifact (attenuation corrected images not available). Low risk.  Marland Kitchen History of kidney stones   . PAF (paroxysmal atrial fibrillation) ([place])    a. diagnosed 5/19; b. CHADS2VASc = 4 (age x 2, vascular disease - noted on CT chest 7/18, female)--> Eliquis; c. 01/2018 Zio Monitor: 14% Afib.  Marland Kitchen Postural dizziness with presyncope    a. TTE 8/18: EF 55-60%, nl WM, Gr1DD, mild MR, mildly dilated LA  . Pulmonary embolism on left [facility])    a. in the setting of right TKA; b. treated with Coumadin  . Pulmonary nodules    a. 03/2017 Chest CT: resolution of RUL ground glass nodule. Stable, numerous small bilat pulm nodules since 2016-->benign.   Past Surgical History:  Procedure Laterality Date  . APPENDECTOMY  1935  . DILATION AND CURETTAGE OF UTERUS  1975  . NEPHRECTOMY Right 1975  . REPLACEMENT TOTAL KNEE BILATERAL  2013  . VEIN LIGATION AND STRIPPING Left 1971     Current Meds  Medication Sig  . acetaminophen (TYLENOL) 500 MG tablet Take 500 mg by mouth every 4 (four) hours as needed for mild pain.   Marland Kitchen albuterol (VENTOLIN HFA) 108 (90 Base) MCG/ACT inhaler Inhale 2 puffs into the lungs every 4 (four) hours as needed for wheezing or shortness of breath.  Marland Kitchen alendronate (FOSAMAX) 70 MG tablet TAKE 1 TABLET EVERY 7 DAYS WITH A FULL GLASS OF WATER ON AN EMPTY STOMACH  . amiodarone (PACERONE) 200 MG tablet Take 1 tablet (200 mg total) by mouth daily.  . benzonatate (TESSALON) 100 MG capsule Take 1 capsule (100 mg total) by mouth every 8 (eight) hours.  . Cholecalciferol (VITAMIN D) 2000 units CAPS Take 1 capsule by mouth daily.  Marland Kitchen diltiazem (CARDIZEM CD) 180 MG 24 hr capsule Take 1 capsule (180 mg total) by mouth daily.  Marland Kitchen docusate sodium (COLACE) 100 MG capsule Take 100-200 mg by mouth 2 (two) times daily. Take 200 mg by mouth in the morning and 100 mg by  mouth at bedtime.  Marland Kitchen ELIQUIS 5 MG TABS tablet TAKE 1 TABLET TWICE A DAY  . escitalopram (LEXAPRO) 5 MG tablet TAKE 1 TABLET DAILY  . fexofenadine (ALLEGRA) 180 MG tablet Take 180 mg by mouth daily.  . Fluticasone-Salmeterol (ADVAIR DISKUS) 250-50 MCG/DOSE AEPB Inhale 1 puff into the lungs in the morning and at bedtime.  . furosemide (LASIX) 20 MG tablet TAKE 1 TABLET DAILY  . gabapentin (NEURONTIN) 300 MG capsule Take 300 mg by mouth 2 (two) times daily.   Donnie Aho (GLUCOSAMINE MSM COMPLEX) TABS Take 1 tablet by mouth 2 (two) times daily.  Marland Kitchen HYDROcodone-acetaminophen (NORCO/VICODIN) 5-325 MG tablet Take 1 tablet by mouth every 6 (six) hours as needed for moderate pain.  . Misc Natural Products (TART CHERRY ADVANCED) CAPS Take 1 capsule by mouth 2 (two) times daily.   . Multiple Vitamins-Minerals (ONE DAILY MULTIVITAMIN WOMEN PO) Take 1 tablet by mouth  daily.  . omeprazole (PRILOSEC) 40 MG capsule TAKE 1 CAPSULE DAILY  . polyethylene glycol (MIRALAX / GLYCOLAX) packet Take 17 g by mouth daily as needed for mild constipation.  . simvastatin (ZOCOR) 20 MG tablet TAKE 1 TABLET DAILY  . TURMERIC PO Take 300 mg by mouth at bedtime.   . [DISCONTINUED] diltiazem (CARDIZEM) 60 MG tablet Take 1 tablet (60 mg total) by mouth 3 (three) times daily.     Allergies:   Tramadol   Social History   Tobacco Use  . Smoking status: Never Smoker  . Smokeless tobacco: Never Used  Vaping Use  . Vaping Use: Never used  Substance Use Topics  . Alcohol use: Yes    Alcohol/week: 0.0 standard drinks    Comment: rare--wine  . Drug use: No      Family Hx: The patient's family history includes Heart disease in her father and maternal aunt; Stroke in her maternal uncle. There is no history of Cancer or Diabetes.  ROS:   Please see the history of present illness.    Review of Systems  Constitutional: Negative.   HENT: Negative.   Respiratory: Negative.   Cardiovascular: Negative.    Gastrointestinal: Negative.   Musculoskeletal: Negative.   Neurological: Negative.   Psychiatric/Behavioral: Negative.   All other systems reviewed and are negative.    Labs/Other Tests and Data Reviewed:    Recent Labs: 02/24/2020: ALT 20; Hemoglobin 11.6; Platelets 345.0; TSH 2.57 08/06/2020: BUN 19; Creatinine, Ser 1.23; Potassium 4.4; Sodium 138   Recent Lipid Panel Lab Results  Component Value Date/Time   CHOL 158 11/10/2019 10:02 AM   TRIG 117.0 11/10/2019 10:02 AM   HDL 56.40 11/10/2019 10:02 AM   CHOLHDL 3 11/10/2019 10:02 AM   LDLCALC 78 11/10/2019 10:02 AM    Wt Readings from Last 3 Encounters:  01/10/21 187 lb (84.8 kg)  08/27/20 176 lb (79.8 kg)  05/10/20 181 lb 8 oz (82.3 kg)     Exam:   BP 134/70 (BP Location: Left Arm, Patient Position: Sitting, Cuff Size: Large)   Pulse 62   Ht 5\' 7"  (1.702 m)   Wt 187 lb (84.8 kg)   SpO2 95%   BMI 29.29 kg/m   Constitutional:  oriented to person, place, and time. No distress.  HENT:  Head: Grossly normal Eyes:  no discharge. No scleral icterus.  Neck: No JVD, no carotid bruits  Cardiovascular: Regular rate and rhythm, no murmurs appreciated Pulmonary/Chest: Clear to auscultation bilaterally, no wheezes or rails Abdominal: Soft.  no distension.  no tenderness.  Musculoskeletal: Normal range of motion Neurological:  normal muscle tone. Coordination normal. No atrophy Skin: Skin warm and dry Psychiatric: normal affect, pleasant   ASSESSMENT & PLAN:    Chronic diastolic CHF (congestive heart failure) (HCC) Recommend she take Lasix 5 days a week Appears euvolemic on today's visit  Coronary artery calcification seen on CAT scan Currently with no symptoms of angina. No further workup at this time. Continue current medication regimen. Cholesterol at goal  Essential hypertension Blood pressure well controlled, no medication changes made  Mixed hyperlipidemia On a statin,  Numbers at goal  PAF (paroxysmal  atrial fibrillation) (HCC) Rare episodes Change diltiazem to diltiazem ER 180 daily Previously taking Dilts 60 3 times daily Continue amiodarone 200 daily Eliquis twice daily   Total encounter time more than 25 minutes  Greater than 50% was spent in counseling and coordination of care with the patient     Signed, Ida Rogue,  MD  01/10/2021 5:24 PM    [place] Office [address], [place], [place] [address]

## 2021-01-10 ENCOUNTER — Ambulatory Visit (INDEPENDENT_AMBULATORY_CARE_PROVIDER_SITE_OTHER): Payer: Medicare Other | Admitting: Cardiovascular Disease

## 2021-01-10 ENCOUNTER — Other Ambulatory Visit: Payer: Self-pay

## 2021-01-10 ENCOUNTER — Encounter: Payer: Self-pay | Admitting: Cardiovascular Disease

## 2021-01-10 VITALS — BP 134/70 | HR 62 | Ht 67.0 in | Wt 187.0 lb

## 2021-01-10 DIAGNOSIS — I5032 Chronic diastolic (congestive) heart failure: Secondary | ICD-10-CM | POA: Diagnosis not present

## 2021-01-10 DIAGNOSIS — I10 Essential (primary) hypertension: Secondary | ICD-10-CM | POA: Diagnosis not present

## 2021-01-10 DIAGNOSIS — I2510 Atherosclerotic heart disease of native coronary artery without angina pectoris: Secondary | ICD-10-CM | POA: Diagnosis not present

## 2021-01-10 DIAGNOSIS — R42 Dizziness and giddiness: Secondary | ICD-10-CM

## 2021-01-10 DIAGNOSIS — I480 Paroxysmal atrial fibrillation: Secondary | ICD-10-CM

## 2021-01-10 DIAGNOSIS — E782 Mixed hyperlipidemia: Secondary | ICD-10-CM | POA: Diagnosis not present

## 2021-01-10 MED ORDER — DILTIAZEM HCL ER COATED BEADS 180 MG PO CP24: 180.0000 mg | ORAL_CAPSULE | Freq: Every day | ORAL | 3 refills | Status: AC

## 2021-01-10 NOTE — Patient Instructions (Addendum)
Medication Instructions:  Eliquis twice a day, even on weekends  Lasix/furosemide daily , skip the weeks  Stop the diltiazem, the three times a day one  Start diltiazem ER 180 mg daily  For long runs of atrial fibrillation, Take extra amiodarone and one of the short acting diltiazem   If you need a refill on your cardiac medications before your next appointment, please call your pharmacy.    Lab work: No new labs needed   If you have labs (blood work) drawn today and your tests are completely normal, you will receive your results only by: Marland Kitchen MyChart Message (if you have MyChart) OR . A paper copy in the mail If you have any lab test that is abnormal or we need to change your treatment, we will call you to review the results.   Testing/Procedures: No new testing needed   Follow-Up: At [facility], you and your health needs are our priority.  As part of our continuing mission to provide you with exceptional heart care, we have created designated Provider Care Teams.  These Care Teams include your primary Cardiologist (physician) and Advanced Practice Providers (APPs -  Physician Assistants and Nurse Practitioners) who all work together to provide you with the care you need, when you need it.  . You will need a follow up appointment in 6 months  . Providers on your designated Care Team:   . Murray Hodgkins, NP . Christell Faith, PA-C . Marrianne Mood, PA-C  Any Other Special Instructions Will Be Listed Below (If Applicable).  COVID-19 Vaccine Information can be found at: [email or web address].co.uk For questions related to vaccine distribution or appointments, please email [email or web address] or call [phone].

## 2021-01-12 DIAGNOSIS — M25512 Pain in left shoulder: Secondary | ICD-10-CM | POA: Diagnosis not present

## 2021-01-27 ENCOUNTER — Other Ambulatory Visit: Payer: Self-pay

## 2021-01-27 ENCOUNTER — Ambulatory Visit (INDEPENDENT_AMBULATORY_CARE_PROVIDER_SITE_OTHER): Payer: Medicare Other | Admitting: Internal Medicine

## 2021-01-27 ENCOUNTER — Encounter: Payer: Self-pay | Admitting: Internal Medicine

## 2021-01-27 VITALS — BP 125/64 | HR 65 | Temp 97.2°F | Resp 17 | Ht 67.0 in | Wt 183.0 lb

## 2021-01-27 DIAGNOSIS — M810 Age-related osteoporosis without current pathological fracture: Secondary | ICD-10-CM

## 2021-01-27 DIAGNOSIS — M1990 Unspecified osteoarthritis, unspecified site: Secondary | ICD-10-CM | POA: Diagnosis not present

## 2021-01-27 DIAGNOSIS — I5032 Chronic diastolic (congestive) heart failure: Secondary | ICD-10-CM | POA: Diagnosis not present

## 2021-01-27 DIAGNOSIS — Z0000 Encounter for general adult medical examination without abnormal findings: Secondary | ICD-10-CM | POA: Diagnosis not present

## 2021-01-27 DIAGNOSIS — I10 Essential (primary) hypertension: Secondary | ICD-10-CM | POA: Diagnosis not present

## 2021-01-27 DIAGNOSIS — I480 Paroxysmal atrial fibrillation: Secondary | ICD-10-CM

## 2021-01-27 DIAGNOSIS — I2510 Atherosclerotic heart disease of native coronary artery without angina pectoris: Secondary | ICD-10-CM | POA: Diagnosis not present

## 2021-01-27 DIAGNOSIS — Z9109 Other allergy status, other than to drugs and biological substances: Secondary | ICD-10-CM

## 2021-01-27 DIAGNOSIS — J4520 Mild intermittent asthma, uncomplicated: Secondary | ICD-10-CM

## 2021-01-27 DIAGNOSIS — E782 Mixed hyperlipidemia: Secondary | ICD-10-CM | POA: Diagnosis not present

## 2021-01-27 DIAGNOSIS — K219 Gastro-esophageal reflux disease without esophagitis: Secondary | ICD-10-CM | POA: Diagnosis not present

## 2021-01-27 DIAGNOSIS — F39 Unspecified mood [affective] disorder: Secondary | ICD-10-CM | POA: Diagnosis not present

## 2021-01-27 LAB — CBC
HCT: 40.3 % (ref 35.0–45.0)
Hemoglobin: 13 g/dL (ref 11.7–15.5)
MCH: 28.7 pg (ref 27.0–33.0)
MCHC: 32.3 g/dL (ref 32.0–36.0)
MCV: 89 fL (ref 80.0–100.0)
MPV: 10.1 fL (ref 7.5–12.5)
Platelets: 213 10*3/uL (ref 140–400)
RBC: 4.53 10*6/uL (ref 3.80–5.10)
RDW: 13.3 % (ref 11.0–15.0)
WBC: 4.3 10*3/uL (ref 3.8–10.8)

## 2021-01-27 LAB — COMPREHENSIVE METABOLIC PANEL
AG Ratio: 1.8 (calc) (ref 1.0–2.5)
ALT: 18 U/L (ref 6–29)
AST: 19 U/L (ref 10–35)
Albumin: 4 g/dL (ref 3.6–5.1)
Alkaline phosphatase (APISO): 46 U/L (ref 37–153)
BUN/Creatinine Ratio: 14 (calc) (ref 6–22)
BUN: 19 mg/dL (ref 7–25)
CO2: 30 mmol/L (ref 20–32)
Calcium: 9.5 mg/dL (ref 8.6–10.4)
Chloride: 102 mmol/L (ref 98–110)
Creat: 1.32 mg/dL — ABNORMAL HIGH (ref 0.60–0.88)
Globulin: 2.2 g/dL (calc) (ref 1.9–3.7)
Glucose, Bld: 74 mg/dL (ref 65–99)
Potassium: 4.3 mmol/L (ref 3.5–5.3)
Sodium: 140 mmol/L (ref 135–146)
Total Bilirubin: 0.4 mg/dL (ref 0.2–1.2)
Total Protein: 6.2 g/dL (ref 6.1–8.1)

## 2021-01-27 LAB — LIPID PANEL
Cholesterol: 170 mg/dL (ref <200)
HDL: 73 mg/dL (ref >=50)
LDL Cholesterol (Calc): 80 mg/dL (calc)
Non-HDL Cholesterol (Calc): 97 mg/dL (calc) (ref <130)
Total CHOL/HDL Ratio: 2.3 (calc) (ref <5.0)
Triglycerides: 86 mg/dL (ref <150)

## 2021-01-27 MED ORDER — OLOPATADINE HCL 0.1 % OP SOLN: 1.0000 [drp] | Freq: Two times a day (BID) | OPHTHALMIC | 12 refills | Status: AC

## 2021-01-27 MED ORDER — OMEPRAZOLE 40 MG PO CPDR: 1.0000 | DELAYED_RELEASE_CAPSULE | Freq: Every day | ORAL | 3 refills | Status: AC

## 2021-01-27 NOTE — Assessment & Plan Note (Signed)
Continue Alendronate, Calcium and Vit D OTC Encouraged weight bearing exercise Will not plan to repeat bone density at this time

## 2021-01-27 NOTE — Assessment & Plan Note (Signed)
Continue Glucosamine, Tumeric, Tylenol, Gabapentin and Hydrocodone as needed Will monitor

## 2021-01-27 NOTE — Assessment & Plan Note (Signed)
Continue Omeprazole and Gaviscon Omeprazole refilled today

## 2021-01-27 NOTE — Assessment & Plan Note (Signed)
Stable on Escitalopram, wean not indicated

## 2021-01-27 NOTE — Assessment & Plan Note (Signed)
Continue Diltiazem and Furosemide CMET today Reinforced DASH diet Will monitor

## 2021-01-27 NOTE — Assessment & Plan Note (Signed)
No angina Continue Simvastatin and Eliquis CMET and lipid profile today

## 2021-01-27 NOTE — Assessment & Plan Note (Signed)
Continue Advair and Albuterol as needed She will continue to follow with pulmonology, will follow

## 2021-01-27 NOTE — Assessment & Plan Note (Signed)
Continue Diltiazem, Amiodarone and Eliquis She will continue to follow with cardiology, will follow

## 2021-01-27 NOTE — Assessment & Plan Note (Signed)
Compensated Continue Diltiazem and Furosemide CMET today She will continue to follow with cardiology, will follow

## 2021-01-27 NOTE — Progress Notes (Signed)
HPI:  Patient presents to the clinic today for her subsequent annual Medicare wellness exam.  She is also due to follow-up chronic conditions.  OA: Mainly in her hands, wrist and back.  She takes  Glucosamine, Tumeric, Tylenol, Gabapentin and Hydrocodone with good relief of symptoms.  Asthma: Mild, persistent.  She reports intermittent cough and shortness of breath.  She uses Advair daily and Albuterol as needed  PFTs from 09/2015 reviewed.  She follows with pulmonology.  GERD: Managed with Omeprazole and Gaviscon OTC as needed. She does need a refill of Omeprazole today. There is no upper GI on file.  HTN: Her BP today is 125/64.  She is taking Diltiazem and Furosemide as prescribed.  ECG from 12/2020 reviewed.  HLD with CAD: She denies angina.  Her last LDL was 38, triglycerides 48, 01/2020.  She denies myalgias on Simvastatin.  She consumes a low-fat diet.  Osteoporosis: Managed with Alendronate, Calcium and Vit D.  She denies jaw pain but did have a recent fall, no major injury.  She gets weightbearing exercise daily.  Bone density from 04/2016 reviewed.  AFib: Paroxysmal.  She is taking Diltiazem, Amiodarone and Eliquis as prescribed.  ECG from 12/2020 reviewed.  She follows with cardiology.  Anxiety: Controlled on Escitalopram.  She is not currently seeing a therapist.  She denies depression, SI/HI.  CHF, Diastolic: She denies lower extremity edema, has intermittent shortness of breath.  She is taking Furosemide and  Diltiazem as prescribed.  Echo from 01/2020 reviewed.  She follows with cardiology.  Past Medical History:  Diagnosis Date  . Allergy   . Arthritis   . Asthma   . Basal cell carcinoma of skin   . Chicken pox   . Diastolic dysfunction    a. TTE 5/19: EF 60-65%, mild focal basal hypertrophy, no RWMA, Gr1DD, mod dilated LA, RV cavity size nl with nl RVSF, PASP 40-45, mildly dilated IVC  . GERD (gastroesophageal reflux disease)   . Hiatal hernia    a. noted on Chest CT  03/2017.  Marland Kitchen History of cardiovascular stress test    a. 01/2018 MV: No ischemia. Fixed antsept, apical inferior defects - likely 2/2 GI uptake artifact (attenuation corrected images not available). Low risk.  Marland Kitchen History of kidney stones   . PAF (paroxysmal atrial fibrillation) ([place])    a. diagnosed 5/19; b. CHADS2VASc = 4 (age x 2, vascular disease - noted on CT chest 7/18, female)--> Eliquis; c. 01/2018 Zio Monitor: 14% Afib.  Marland Kitchen Postural dizziness with presyncope    a. TTE 8/18: EF 55-60%, nl WM, Gr1DD, mild MR, mildly dilated LA  . Pulmonary embolism on left [facility])    a. in the setting of right TKA; b. treated with Coumadin  . Pulmonary nodules    a. 03/2017 Chest CT: resolution of RUL ground glass nodule. Stable, numerous small bilat pulm nodules since 2016-->benign.    Current Outpatient Medications  Medication Sig Dispense Refill  . acetaminophen (TYLENOL) 500 MG tablet Take 500 mg by mouth every 4 (four) hours as needed for mild pain.     Marland Kitchen albuterol (VENTOLIN HFA) 108 (90 Base) MCG/ACT inhaler Inhale 2 puffs into the lungs every 4 (four) hours as needed for wheezing or shortness of breath. 18 g 0  . alendronate (FOSAMAX) 70 MG tablet TAKE 1 TABLET EVERY 7 DAYS WITH A FULL GLASS OF WATER ON AN EMPTY STOMACH 12 tablet 1  . amiodarone (PACERONE) 200 MG tablet Take 1 tablet (200 mg total) by  mouth daily. 90 tablet 3  . benzonatate (TESSALON) 100 MG capsule Take 1 capsule (100 mg total) by mouth every 8 (eight) hours. 21 capsule 0  . Cholecalciferol (VITAMIN D) 2000 units CAPS Take 1 capsule by mouth daily.    Marland Kitchen diltiazem (CARDIZEM CD) 180 MG 24 hr capsule Take 1 capsule (180 mg total) by mouth daily. 90 capsule 3  . diltiazem (CARDIZEM) 60 MG tablet Take 1 tablet (60 mg total) by mouth 3 (three) times daily as needed (atrial fibrillation spells). 90 tablet 0  . docusate sodium (COLACE) 100 MG capsule Take 100-200 mg by mouth 2 (two) times daily. Take 200 mg by mouth in the morning and 100 mg by  mouth at bedtime.    Marland Kitchen ELIQUIS 5 MG TABS tablet TAKE 1 TABLET TWICE A DAY 180 tablet 1  . escitalopram (LEXAPRO) 5 MG tablet TAKE 1 TABLET DAILY 90 tablet 0  . fexofenadine (ALLEGRA) 180 MG tablet Take 180 mg by mouth daily.    . Fluticasone-Salmeterol (ADVAIR DISKUS) 250-50 MCG/DOSE AEPB Inhale 1 puff into the lungs in the morning and at bedtime. 180 each 1  . furosemide (LASIX) 20 MG tablet TAKE 1 TABLET DAILY 90 tablet 3  . gabapentin (NEURONTIN) 300 MG capsule Take 300 mg by mouth 2 (two) times daily.     Donnie Aho (GLUCOSAMINE MSM COMPLEX) TABS Take 1 tablet by mouth 2 (two) times daily.    Marland Kitchen HYDROcodone-acetaminophen (NORCO/VICODIN) 5-325 MG tablet Take 1 tablet by mouth every 6 (six) hours as needed for moderate pain.    . Misc Natural Products (TART CHERRY ADVANCED) CAPS Take 1 capsule by mouth 2 (two) times daily.     . Multiple Vitamins-Minerals (ONE DAILY MULTIVITAMIN WOMEN PO) Take 1 tablet by mouth daily.    Marland Kitchen omeprazole (PRILOSEC) 40 MG capsule TAKE 1 CAPSULE DAILY 90 capsule 1  . polyethylene glycol (MIRALAX / GLYCOLAX) packet Take 17 g by mouth daily as needed for mild constipation. 14 each 0  . simvastatin (ZOCOR) 20 MG tablet TAKE 1 TABLET DAILY 90 tablet 0  . TURMERIC PO Take 300 mg by mouth at bedtime.      No current facility-administered medications for this visit.    Allergies  Allergen Reactions  . Tramadol Other (See Comments)    Pt states she had a syncopal episode and confusion with Elev BP.     Family History  Problem Relation Age of Onset  . Heart disease Father   . Stroke Maternal Uncle   . Heart disease Maternal Aunt   . Cancer Neg Hx   . Diabetes Neg Hx     Social History   Socioeconomic History  . Marital status: Widowed    Spouse name: Not on file  . Number of children: Not on file  . Years of education: Not on file  . Highest education level: Not on file  Occupational History  . Not on file  Tobacco Use  . Smoking  status: Never Smoker  . Smokeless tobacco: Never Used  Vaping Use  . Vaping Use: Never used  Substance and Sexual Activity  . Alcohol use: Yes    Alcohol/week: 0.0 standard drinks    Comment: rare--wine  . Drug use: No  . Sexual activity: Not Currently  Other Topics Concern  . Not on file  Social History Narrative  . Not on file   Social Determinants of Health   Financial Resource Strain: Not on file  Food Insecurity: No Food  Insecurity  . Worried About Charity fundraiser in the Last Year: Never true  . Ran Out of Food in the Last Year: Never true  Transportation Needs: No Transportation Needs  . Lack of Transportation (Medical): No  . Lack of Transportation (Non-Medical): No  Physical Activity: Not on file  Stress: Not on file  Social Connections: Not on file  Intimate Partner Violence: Not on file    Hospitiliaztions: None  Health Maintenance:    Flu: 05/2019             Tetanus: 10/2009             Pneumovax: 11/2015             Prevnar: 03/2013             Zostavax: 2012 in [place]             Shingrix: never             COVID: [place] x2             Mammogram: no longer screening             Pap Smear: no longer screening             Bone Density: 04/2016             Colon Screening: no longer screening             Eye Doctor: annually             Dental Exam: as needed, dentures    Providers:   PCP: Webb Silversmith, NP-C             Cardiologist: Dr. Rockey Situ             Pulmonologist: Dr. Mortimer Fries             Gastroenterologist: Dr. Bailey Mech    I have personally reviewed and have noted:  1. The patient's medical and social history 2. Their use of alcohol, tobacco or illicit drugs 3. Their current medications and supplements 4. The patient's functional ability including ADL's, fall risks, home safety risks and hearing or visual impairment. 5. Diet and physical activities 6. Evidence for depression or mood disorder  Subjective:   Review of Systems:    Constitutional: Denies fever, malaise, fatigue, headache or abrupt weight changes.  HEENT: Pt reports itchy watery eyes, runny nose. Denies eye pain, eye redness, ear pain, ringing in the ears, wax buildup, nasal congestion, bloody nose, or sore throat. Respiratory: Patient reports intermittent shortness of breath.  Denies difficulty breathing, cough or sputum production.   Cardiovascular: Denies chest pain, chest tightness, palpitations or swelling in the hands or feet.  Gastrointestinal: Pt reports intermittent constipation. Denies abdominal pain, bloating, diarrhea or blood in the stool.  GU: Pt reports stress incontinence. Denies urgency, frequency, pain with urination, burning sensation, blood in urine, odor or discharge. Musculoskeletal: Patient reports intermittent joint pain.  Denies decrease in range of motion, difficulty with gait, muscle pain or joint swelling.  Skin: Denies redness, rashes, lesions or ulcercations.  Neurological: Pt reports difficulty with short term memory. Denies dizziness, difficulty with speech or problems with balance and coordination.  Psych: Patient has a history of anxiety.  Denies depression, SI/HI.  No other specific complaints in a complete review of systems (except as listed in HPI above).  Objective:  PE:   BP 125/64 (BP Location: Right Arm, Patient Position: Sitting, Cuff Size: Normal)   Pulse  65   Temp (!) 97.2 F (36.2 C) (Temporal)   Resp 17   Ht _0  (1.702 m)   Wt 183 lb (83 kg)   BMI 28.66 kg/m   Wt Readings from Last 3 Encounters:  01/10/21 187 lb (84.8 kg)  08/27/20 176 lb (79.8 kg)  05/10/20 181 lb 8 oz (82.3 kg)    General: Appears her stated age, in NAD. Skin: Warm, dry and intact. No ulcerations noted. HEENT: Head: normal shape and size; Eyes: sclera white and EOMs intact;  Neck: Neck supple, trachea midline. No masses, lumps or thyromegaly present.  Cardiovascular: Normal rate and rhythm. S1,S2 noted.  No murmur, rubs  or gallops noted. No JVD or BLE edema. No carotid bruits noted. Pulmonary/Chest: Normal effort and positive vesicular breath sounds. No respiratory distress. No wheezes, rales or ronchi noted.  Abdomen: Soft and nontender. Normal bowel sounds. No distention or masses noted. Liver, spleen and kidneys non palpable. Musculoskeletal:  Strength 5/5 BUE/BLE.  Gait slow and steady with use of cane. Neurological: Alert and oriented. Cranial nerves II-XII grossly intact. Coordination normal.  Psychiatric: Mood and affect normal. Behavior is normal. Judgment and thought content normal.     BMET    Component Value Date/Time   NA 138 08/06/2020 1407   NA 144 08/13/2019 0941   K 4.4 08/06/2020 1407   CL 100 08/06/2020 1407   CO2 26 08/06/2020 1407   GLUCOSE 103 (H) 08/06/2020 1407   BUN 19 08/06/2020 1407   BUN 17 08/13/2019 0941   CREATININE 1.23 (H) 08/06/2020 1407   CALCIUM 9.3 08/06/2020 1407   GFRNONAA 41 (L) 08/06/2020 1407   GFRAA 59 (L) 08/13/2019 0941    Lipid Panel     Component Value Date/Time   CHOL 158 11/10/2019 1002   TRIG 117.0 11/10/2019 1002   HDL 56.40 11/10/2019 1002   CHOLHDL 3 11/10/2019 1002   VLDL 23.4 11/10/2019 1002   LDLCALC 78 11/10/2019 1002    CBC    Component Value Date/Time   WBC 4.1 02/24/2020 1035   RBC 3.98 02/24/2020 1035   HGB 11.6 (L) 02/24/2020 1035   HGB 12.8 08/13/2019 0941   HCT 34.9 (L) 02/24/2020 1035   HCT 38.3 08/13/2019 0941   PLT 345.0 02/24/2020 1035   PLT 222 08/13/2019 0941   MCV 87.7 02/24/2020 1035   MCV 86 08/13/2019 0941   MCH 28.8 08/13/2019 0941   MCH 29.7 01/31/2018 0627   MCHC 33.2 02/24/2020 1035   RDW 14.9 02/24/2020 1035   RDW 12.8 08/13/2019 0941   LYMPHSABS 1.8 04/12/2018 1459   MONOABS 0.9 01/28/2018 1224   EOSABS 0.2 04/12/2018 1459   BASOSABS 0.1 04/12/2018 1459    Hgb A1C Lab Results  Component Value Date   HGBA1C 5.6 01/31/2018      Assessment and Plan:   Medicare Annual Wellness  Visit:  Diet: She does eat meat. She consumes fruits and veggies. She tries to avoid fried foods.  Physical activity: Walking Depression/mood screen: Negative PHQ 9 score of 3, medicated Hearing: Intact to whispered voice Visual acuity: Grossly normal, performs annual eye exam  ADLs: Capable with use of cane Fall risk: Moderate, declines PT at this time Home safety: Good Cognitive evaluation: Trouble with recall. Intact to orientation, naming, and repetition. MMSE score of 27. EOL planning: Adv directives, full code/ I agree  Preventative Medicine: Encouraged her to get a flu shot in the fall.  She declines tetanus for financial reasons but  advised her if she gets bitten or cut she will need to get this done.  Pneumovax and Prevnar UTD.  Zostavax UTD.  Advised her if she wanted a Shingrix vaccine she will need to get this at the pharmacy.  Encouraged her to finish her covid vaccination. She is no longer screening for cervical, breast or colon cancer.  Given her age will not repeat bone density at this time.  Advised her to consume a balanced diet and exercise regimen.  Encouraged her to see an eye doctor annually and dentist as needed.  Will check CBC, c-Met, lipid profile today.  Due dates for screening exams given to patient as part of her AVS.   Next appointment: 1 year, Medicare wellness exam   Webb Silversmith, NP This visit occurred during the SARS-CoV-2 public health emergency.  Safety protocols were in place, including screening questions prior to the visit, additional usage of staff PPE, and extensive cleaning of exam room while observing appropriate contact time as indicated for disinfecting solutions.

## 2021-01-27 NOTE — Addendum Note (Signed)
Addended by: Jearld Fenton on: 01/27/2021 02:40 PM   Modules accepted: Level of Service

## 2021-01-27 NOTE — Assessment & Plan Note (Signed)
Continue Fexofenadine Will add Pataday for eye symptoms

## 2021-01-27 NOTE — Patient Instructions (Signed)

## 2021-01-27 NOTE — Assessment & Plan Note (Signed)
CMET and lipid profile today Encouraged her to consume a low fat diet Continue Simvastatin

## 2021-01-31 DIAGNOSIS — M5134 Other intervertebral disc degeneration, thoracic region: Secondary | ICD-10-CM | POA: Diagnosis not present

## 2021-01-31 DIAGNOSIS — G894 Chronic pain syndrome: Secondary | ICD-10-CM | POA: Diagnosis not present

## 2021-01-31 DIAGNOSIS — M19012 Primary osteoarthritis, left shoulder: Secondary | ICD-10-CM | POA: Diagnosis not present

## 2021-01-31 DIAGNOSIS — M5416 Radiculopathy, lumbar region: Secondary | ICD-10-CM | POA: Diagnosis not present

## 2021-01-31 DIAGNOSIS — M546 Pain in thoracic spine: Secondary | ICD-10-CM | POA: Diagnosis not present

## 2021-01-31 DIAGNOSIS — M4854XD Collapsed vertebra, not elsewhere classified, thoracic region, subsequent encounter for fracture with routine healing: Secondary | ICD-10-CM | POA: Diagnosis not present

## 2021-01-31 DIAGNOSIS — M545 Low back pain, unspecified: Secondary | ICD-10-CM | POA: Diagnosis not present

## 2021-01-31 DIAGNOSIS — M47814 Spondylosis without myelopathy or radiculopathy, thoracic region: Secondary | ICD-10-CM | POA: Diagnosis not present

## 2021-02-07 ENCOUNTER — Other Ambulatory Visit: Payer: Self-pay | Admitting: Family

## 2021-02-08 ENCOUNTER — Other Ambulatory Visit: Payer: Self-pay | Admitting: Internal Medicine

## 2021-02-18 ENCOUNTER — Other Ambulatory Visit: Payer: Self-pay | Admitting: Internal Medicine

## 2021-02-18 NOTE — Telephone Encounter (Signed)
Patient seen 01/24/21- per notes- continue statin

## 2021-02-22 ENCOUNTER — Telehealth: Payer: Self-pay | Admitting: Internal Medicine

## 2021-02-22 DIAGNOSIS — R2689 Other abnormalities of gait and mobility: Secondary | ICD-10-CM

## 2021-02-22 NOTE — Telephone Encounter (Signed)
Referral for home health placed. 

## 2021-02-22 NOTE — Addendum Note (Signed)
Addended by: Jearld Fenton on: 02/22/2021 02:41 PM   Modules accepted: Orders

## 2021-02-22 NOTE — Telephone Encounter (Signed)
Pt had medicare annual wellcare with regina on 01-27-2021. Pt is calling and would like regina to order in home PT for balance problems and strengthening  and also home health nurse to monitor her conditions. Pt has asthma, afib and sometimes sob . Pt using her inhaler before she goes on walks. Pt is not having any symptoms that  she is aware of. Pt also has pulmonologist and cardiologist with [facility]. Pt has orthopaedic md at [facility] who also wants her to do exercises.

## 2021-02-28 ENCOUNTER — Telehealth: Payer: Self-pay | Admitting: Internal Medicine

## 2021-02-28 NOTE — Telephone Encounter (Signed)
Home Health Verbal Orders - Caller/Agency: Alex// [facility] Callback Number: [phone] [id number]  [id number] OT/PT/Skilled Nursing/Social Work/Speech Therapy: PT Frequency: 2w5, 1w3

## 2021-03-01 NOTE — Telephone Encounter (Signed)
[place] for OT/PT/Skilled Nursing/Social Work and Theme park manager Therapy as requested

## 2021-03-02 ENCOUNTER — Telehealth: Payer: Self-pay | Admitting: Internal Medicine

## 2021-03-02 NOTE — Telephone Encounter (Signed)
Copied from [place] [id number]. Topic: Quick Communication - Home Health Verbal Orders >> Mar 02, 2021  4:33 PM Yvette Rack wrote: Caller/Agency: Cecille Rubin with [facility] Callback Number: [phone] Requesting OT/PT/Skilled Nursing/Social Work/Speech Therapy: skilled nursing Frequency: 2 times a week for 2 weeks ,1 time a week for 7 weeks, 2 prn visits

## 2021-03-02 NOTE — Telephone Encounter (Signed)
I called and left a message on Julie Davies vm to continue with PT.

## 2021-03-03 NOTE — Telephone Encounter (Signed)
Verbal given per Rollene Fare.

## 2021-03-03 NOTE — Telephone Encounter (Signed)
Ok for verbal orders as requested 

## 2021-03-15 DIAGNOSIS — D2261 Melanocytic nevi of right upper limb, including shoulder: Secondary | ICD-10-CM | POA: Diagnosis not present

## 2021-03-15 DIAGNOSIS — D225 Melanocytic nevi of trunk: Secondary | ICD-10-CM | POA: Diagnosis not present

## 2021-03-15 DIAGNOSIS — D2272 Melanocytic nevi of left lower limb, including hip: Secondary | ICD-10-CM | POA: Diagnosis not present

## 2021-03-15 DIAGNOSIS — L821 Other seborrheic keratosis: Secondary | ICD-10-CM | POA: Diagnosis not present

## 2021-03-15 DIAGNOSIS — L570 Actinic keratosis: Secondary | ICD-10-CM | POA: Diagnosis not present

## 2021-03-15 DIAGNOSIS — D2271 Melanocytic nevi of right lower limb, including hip: Secondary | ICD-10-CM | POA: Diagnosis not present

## 2021-03-15 DIAGNOSIS — D2262 Melanocytic nevi of left upper limb, including shoulder: Secondary | ICD-10-CM | POA: Diagnosis not present

## 2021-03-15 DIAGNOSIS — Z85828 Personal history of other malignant neoplasm of skin: Secondary | ICD-10-CM | POA: Diagnosis not present

## 2021-04-06 ENCOUNTER — Encounter: Payer: Self-pay | Admitting: Internal Medicine

## 2021-04-06 ENCOUNTER — Ambulatory Visit (INDEPENDENT_AMBULATORY_CARE_PROVIDER_SITE_OTHER): Payer: Medicare Other | Admitting: Internal Medicine

## 2021-04-06 ENCOUNTER — Other Ambulatory Visit: Payer: Self-pay

## 2021-04-06 VITALS — BP 129/62 | HR 69 | Temp 97.3°F | Resp 17 | Ht 67.0 in | Wt 183.0 lb

## 2021-04-06 DIAGNOSIS — M810 Age-related osteoporosis without current pathological fracture: Secondary | ICD-10-CM

## 2021-04-06 DIAGNOSIS — I10 Essential (primary) hypertension: Secondary | ICD-10-CM | POA: Diagnosis not present

## 2021-04-06 DIAGNOSIS — K219 Gastro-esophageal reflux disease without esophagitis: Secondary | ICD-10-CM | POA: Diagnosis not present

## 2021-04-06 DIAGNOSIS — M1990 Unspecified osteoarthritis, unspecified site: Secondary | ICD-10-CM

## 2021-04-06 DIAGNOSIS — I480 Paroxysmal atrial fibrillation: Secondary | ICD-10-CM | POA: Diagnosis not present

## 2021-04-06 DIAGNOSIS — I700 Atherosclerosis of aorta: Secondary | ICD-10-CM | POA: Insufficient documentation

## 2021-04-06 DIAGNOSIS — I5032 Chronic diastolic (congestive) heart failure: Secondary | ICD-10-CM

## 2021-04-06 DIAGNOSIS — J4520 Mild intermittent asthma, uncomplicated: Secondary | ICD-10-CM | POA: Diagnosis not present

## 2021-04-06 DIAGNOSIS — E782 Mixed hyperlipidemia: Secondary | ICD-10-CM

## 2021-04-06 DIAGNOSIS — I2510 Atherosclerotic heart disease of native coronary artery without angina pectoris: Secondary | ICD-10-CM

## 2021-04-06 DIAGNOSIS — Z0289 Encounter for other administrative examinations: Secondary | ICD-10-CM | POA: Diagnosis not present

## 2021-04-06 DIAGNOSIS — F39 Unspecified mood [affective] disorder: Secondary | ICD-10-CM

## 2021-04-06 NOTE — Assessment & Plan Note (Signed)
Not on statin given age

## 2021-04-06 NOTE — Progress Notes (Signed)
Subjective:    Patient ID: Julie Davies, female    DOB: 01-Oct-1926, 87 y.o.   MRN: [id number]  HPI  Patient presents the clinic today for [facility] 2 form completion. She will be moving into [facility]. She is independent with ADL's- bathing, dressing and toileting. She walks with cane, walker or rolling walker. She has not had a fall in over a month. She needs assist with meals and medication management.  Review of Systems     Past Medical History:  Diagnosis Date   Allergy    Arthritis    Asthma    Basal cell carcinoma of skin    Chicken pox    Diastolic dysfunction    a. TTE 5/19: EF 60-65%, mild focal basal hypertrophy, no RWMA, Gr1DD, mod dilated LA, RV cavity size nl with nl RVSF, PASP 40-45, mildly dilated IVC   GERD (gastroesophageal reflux disease)    Hiatal hernia    a. noted on Chest CT 03/2017.   History of cardiovascular stress test    a. 01/2018 MV: No ischemia. Fixed antsept, apical inferior defects - likely 2/2 GI uptake artifact (attenuation corrected images not available). Low risk.   History of kidney stones    PAF (paroxysmal atrial fibrillation) ([place])    a. diagnosed 5/19; b. CHADS2VASc = 4 (age x 2, vascular disease - noted on CT chest 7/18, female)--> Eliquis; c. 01/2018 Zio Monitor: 14% Afib.   Postural dizziness with presyncope    a. TTE 8/18: EF 55-60%, nl WM, Gr1DD, mild MR, mildly dilated LA   Pulmonary embolism on left ([place])    a. in the setting of right TKA; b. treated with Coumadin   Pulmonary nodules    a. 03/2017 Chest CT: resolution of RUL ground glass nodule. Stable, numerous small bilat pulm nodules since 2016-->benign.    Current Outpatient Medications  Medication Sig Dispense Refill   acetaminophen (TYLENOL) 500 MG tablet Take 500 mg by mouth every 4 (four) hours as needed for mild pain.      albuterol (VENTOLIN HFA) 108 (90 Base) MCG/ACT inhaler Inhale 2 puffs into the lungs every 4 (four) hours as needed for wheezing or shortness of breath.  18 g 0   alendronate (FOSAMAX) 70 MG tablet TAKE 1 TABLET EVERY 7 DAYS WITH A FULL GLASS OF WATER ON AN EMPTY STOMACH 12 tablet 1   Cholecalciferol (VITAMIN D) 2000 units CAPS Take 1 capsule by mouth daily.     diltiazem (CARDIZEM CD) 180 MG 24 hr capsule Take 1 capsule (180 mg total) by mouth daily. 90 capsule 3   diltiazem (CARDIZEM) 60 MG tablet Take 1 tablet (60 mg total) by mouth 3 (three) times daily as needed (atrial fibrillation spells). 90 tablet 0   docusate sodium (COLACE) 100 MG capsule Take 100-200 mg by mouth 2 (two) times daily. Take 200 mg by mouth in the morning and 100 mg by mouth at bedtime.     ELIQUIS 5 MG TABS tablet TAKE 1 TABLET TWICE A DAY 180 tablet 1   escitalopram (LEXAPRO) 5 MG tablet TAKE 1 TABLET DAILY 90 tablet 3   fexofenadine (ALLEGRA) 180 MG tablet Take 180 mg by mouth daily.     Fluticasone-Salmeterol (ADVAIR DISKUS) 250-50 MCG/DOSE AEPB Inhale 1 puff into the lungs in the morning and at bedtime. 180 each 1   furosemide (LASIX) 20 MG tablet TAKE 1 TABLET DAILY 90 tablet 3   gabapentin (NEURONTIN) 300 MG capsule Take 300 mg by mouth 2 (  two) times daily.      Glucos-MSM-C-Mn-Ginger-Willow (GLUCOSAMINE MSM COMPLEX) TABS Take 1 tablet by mouth 2 (two) times daily.     HYDROcodone-acetaminophen (NORCO/VICODIN) 5-325 MG tablet Take 1 tablet by mouth every 6 (six) hours as needed for moderate pain.     Misc Natural Products (TART CHERRY ADVANCED) CAPS Take 1 capsule by mouth 2 (two) times daily.      Multiple Vitamins-Minerals (ONE DAILY MULTIVITAMIN WOMEN PO) Take 1 tablet by mouth daily.     olopatadine (PATADAY) 0.1 % ophthalmic solution Place 1 drop into both eyes 2 (two) times daily. 5 mL 12   omeprazole (PRILOSEC) 40 MG capsule Take 1 capsule (40 mg total) by mouth daily. 90 capsule 3   PACERONE 200 MG tablet TAKE 1 TABLET DAILY 90 tablet 1   polyethylene glycol (MIRALAX / GLYCOLAX) packet Take 17 g by mouth daily as needed for mild constipation. 14 each 0    simvastatin (ZOCOR) 20 MG tablet TAKE 1 TABLET DAILY 90 tablet 3   TURMERIC PO Take 300 mg by mouth at bedtime.      No current facility-administered medications for this visit.    Allergies  Allergen Reactions   Tramadol Other (See Comments)    Pt states she had a syncopal episode and confusion with Elev BP.     Family History  Problem Relation Age of Onset   Heart disease Father    Stroke Maternal Uncle    Heart disease Maternal Aunt    Cancer Neg Hx    Diabetes Neg Hx     Social History   Socioeconomic History   Marital status: Widowed    Spouse name: Not on file   Number of children: Not on file   Years of education: Not on file   Highest education level: Not on file  Occupational History   Not on file  Tobacco Use   Smoking status: Never   Smokeless tobacco: Never  Vaping Use   Vaping Use: Never used  Substance and Sexual Activity   Alcohol use: Yes    Alcohol/week: 0.0 standard drinks    Comment: rare--wine   Drug use: No   Sexual activity: Not Currently  Other Topics Concern   Not on file  Social History Narrative   Not on file   Social Determinants of Health   Financial Resource Strain: Not on file  Food Insecurity: No Food Insecurity   Worried About Running Out of Food in the Last Year: Never true   Ran Out of Food in the Last Year: Never true  Transportation Needs: No Transportation Needs   Lack of Transportation (Medical): No   Lack of Transportation (Non-Medical): No  Physical Activity: Not on file  Stress: Not on file  Social Connections: Not on file  Intimate Partner Violence: Not on file     Constitutional: Denies fever, malaise, fatigue, headache or abrupt weight changes.  HEENT: Denies eye pain, eye redness, ear pain, ringing in the ears, wax buildup, runny nose, nasal congestion, bloody nose, or sore throat. Respiratory: Pt reports dyspnea on exertion. Denies difficulty breathing, cough or sputum production.   Cardiovascular: Denies  chest pain, chest tightness, palpitations or swelling in the hands or feet.  Gastrointestinal: Pt reports intermittent constipation. Denies abdominal pain, bloating, diarrhea or blood in the stool.  GU: Denies urgency, frequency, pain with urination, burning sensation, blood in urine, odor or discharge. Musculoskeletal: Pt reports difficulty with gait. Denies decrease in range of motion, muscle pain  or joint pain and swelling.  Skin: Denies redness, rashes, lesions or ulcercations.  Neurological: Denies dizziness, difficulty with memory, difficulty with speech or problems with balance and coordination.  Psych: Denies anxiety, depression, SI/HI.  No other specific complaints in a complete review of systems (except as listed in HPI above).  Objective:   Physical Exam BP 129/62 (BP Location: Left Arm, Patient Position: Sitting, Cuff Size: Normal)   Pulse 69   Temp (!) 97.3 F (36.3 C) (Temporal)   Resp 17   Ht 5\' 7"  (1.702 m)   Wt 183 lb (83 kg)   SpO2 97%   BMI 28.66 kg/m   Wt Readings from Last 3 Encounters:  01/27/21 183 lb (83 kg)  01/10/21 187 lb (84.8 kg)  08/27/20 176 lb (79.8 kg)    General: Appears her stated age, overweight, in NAD. Skin: Warm, dry and intact.  HEENT: Head: normal shape and size; Eyes: sclera white and EOMs intact;  Cardiovascular: Normal rate and rhythm. S1,S2 noted.  No murmur, rubs or gallops noted.  Pulmonary/Chest: Normal effort and positive vesicular breath sounds. No respiratory distress. No wheezes, rales or ronchi noted.  Musculoskeletal: Gait slow and steady with use of cane.  Neurological: Alert and oriented.  Psychiatric: Mood and affect normal. Behavior is normal. Judgment and thought content normal.   BMET    Component Value Date/Time   NA 140 01/27/2021 0834   NA 144 08/13/2019 0941   K 4.3 01/27/2021 0834   CL 102 01/27/2021 0834   CO2 30 01/27/2021 0834   GLUCOSE 74 01/27/2021 0834   BUN 19 01/27/2021 0834   BUN 17 08/13/2019  0941   CREATININE 1.32 (H) 01/27/2021 0834   CALCIUM 9.5 01/27/2021 0834   GFRNONAA 41 (L) 08/06/2020 1407   GFRAA 59 (L) 08/13/2019 0941    Lipid Panel     Component Value Date/Time   CHOL 170 01/27/2021 0834   TRIG 86 01/27/2021 0834   HDL 73 01/27/2021 0834   CHOLHDL 2.3 01/27/2021 0834   VLDL 23.4 11/10/2019 1002   LDLCALC 80 01/27/2021 0834    CBC    Component Value Date/Time   WBC 4.3 01/27/2021 0834   RBC 4.53 01/27/2021 0834   HGB 13.0 01/27/2021 0834   HGB 12.8 08/13/2019 0941   HCT 40.3 01/27/2021 0834   HCT 38.3 08/13/2019 0941   PLT 213 01/27/2021 0834   PLT 222 08/13/2019 0941   MCV 89.0 01/27/2021 0834   MCV 86 08/13/2019 0941   MCH 28.7 01/27/2021 0834   MCHC 32.3 01/27/2021 0834   RDW 13.3 01/27/2021 0834   RDW 12.8 08/13/2019 0941   LYMPHSABS 1.8 04/12/2018 1459   MONOABS 0.9 01/28/2018 1224   EOSABS 0.2 04/12/2018 1459   BASOSABS 0.1 04/12/2018 1459    Hgb A1C Lab Results  Component Value Date   HGBA1C 5.6 01/31/2018           Assessment & Plan:   Encounter for Form Completion with Patient:  FL2 form completed, copy scanned into chart, original given to patient Discussed transition to ALF here She ultimately would like to go back to [place] where she lived for many years We will continue current medications at this time  Return precautions discussed  Webb Silversmith, NP This visit occurred during the SARS-CoV-2 public health emergency.  Safety protocols were in place, including screening questions prior to the visit, additional usage of staff PPE, and extensive cleaning of exam room while observing appropriate contact  time as indicated for disinfecting solutions.

## 2021-04-06 NOTE — Assessment & Plan Note (Signed)
Continue daily Lasix

## 2021-04-06 NOTE — Assessment & Plan Note (Signed)
Continue Cardizem and Eliquis

## 2021-04-06 NOTE — Assessment & Plan Note (Signed)
No issues off meds °

## 2021-04-06 NOTE — Assessment & Plan Note (Signed)
Continue Fosamax  

## 2021-04-06 NOTE — Assessment & Plan Note (Signed)
Not on statin 

## 2021-04-06 NOTE — Assessment & Plan Note (Signed)
Continue Lexapro

## 2021-04-06 NOTE — Assessment & Plan Note (Addendum)
Pain controlled with Tylenol as needed

## 2021-04-06 NOTE — Assessment & Plan Note (Signed)
Controlled off meds.

## 2021-04-06 NOTE — Assessment & Plan Note (Signed)
Continue Advair and albuterol 

## 2021-04-07 ENCOUNTER — Telehealth: Payer: Self-pay

## 2021-04-07 NOTE — Telephone Encounter (Signed)
Copied from [place] [id number]. Topic: General - Other >> Apr 07, 2021  9:59 AM Lennox Solders wrote: Reason for CRM: Deatra Ina with [facility] in [place] [facility] will refax FL-2 addendum again and would like last 2 office visit to be fax also. Lisa fax on 03-24-2021. The fax number is [phone] and phone number is [phone]

## 2021-04-07 NOTE — Telephone Encounter (Signed)
I called and spoke with Julie Davies and refaxed the documentation  to [phone]

## 2021-04-08 NOTE — Telephone Encounter (Signed)
I attempted to contact Lattie Haw the clinical coordinator, no answer and voicemail full. I placed the form in your inbox on your desk for review.

## 2021-04-08 NOTE — Telephone Encounter (Signed)
Leah calling from [facility] in [place] is asking if the FL-2 with addendum please be refaxed Fax number [phone] Phone- [phone]

## 2021-04-08 NOTE — Telephone Encounter (Signed)
Form filled out, placed in your inbox

## 2021-04-26 ENCOUNTER — Telehealth: Payer: Self-pay | Admitting: Cardiovascular Disease

## 2021-04-26 MED ORDER — FUROSEMIDE 20 MG PO TABS
ORAL_TABLET | ORAL | 3 refills | Status: AC
Start: 2021-04-26 — End: ?

## 2021-04-26 NOTE — Telephone Encounter (Signed)
Was able to reach back out to Julie Davies with [facility] moved into the facility yesterday  Tiffany had question regarding Pacerone, as it was not on pt's current med list, pt's daughter advised pt was taking medication.  Reviewed all pt's current cardiac medication prescribe by Dr. Rockey Situ since recent [place] in April 2022  Order list of pt's current medication faxed to [place] per request.

## 2021-04-26 NOTE — Telephone Encounter (Signed)
Tiffany from [facility] is calling to see if patient needs to continue taking medication Pacerone '200mg'$ 

## 2021-04-29 DIAGNOSIS — I480 Paroxysmal atrial fibrillation: Secondary | ICD-10-CM | POA: Diagnosis not present

## 2021-04-29 DIAGNOSIS — I2510 Atherosclerotic heart disease of native coronary artery without angina pectoris: Secondary | ICD-10-CM | POA: Diagnosis not present

## 2021-04-29 DIAGNOSIS — E663 Overweight: Secondary | ICD-10-CM | POA: Diagnosis not present

## 2021-04-29 DIAGNOSIS — Z6827 Body mass index (BMI) 27.0-27.9, adult: Secondary | ICD-10-CM | POA: Diagnosis not present

## 2021-04-29 DIAGNOSIS — M1990 Unspecified osteoarthritis, unspecified site: Secondary | ICD-10-CM | POA: Diagnosis not present

## 2021-04-29 DIAGNOSIS — J4520 Mild intermittent asthma, uncomplicated: Secondary | ICD-10-CM | POA: Diagnosis not present

## 2021-04-29 DIAGNOSIS — Z9181 History of falling: Secondary | ICD-10-CM | POA: Diagnosis not present

## 2021-04-29 DIAGNOSIS — I700 Atherosclerosis of aorta: Secondary | ICD-10-CM | POA: Diagnosis not present

## 2021-04-29 DIAGNOSIS — I5032 Chronic diastolic (congestive) heart failure: Secondary | ICD-10-CM | POA: Diagnosis not present

## 2021-04-29 DIAGNOSIS — F39 Unspecified mood [affective] disorder: Secondary | ICD-10-CM | POA: Diagnosis not present

## 2021-04-29 DIAGNOSIS — I110 Hypertensive heart disease with heart failure: Secondary | ICD-10-CM | POA: Diagnosis not present

## 2021-05-05 DIAGNOSIS — I5032 Chronic diastolic (congestive) heart failure: Secondary | ICD-10-CM | POA: Diagnosis not present

## 2021-05-05 DIAGNOSIS — I700 Atherosclerosis of aorta: Secondary | ICD-10-CM | POA: Diagnosis not present

## 2021-05-05 DIAGNOSIS — I110 Hypertensive heart disease with heart failure: Secondary | ICD-10-CM | POA: Diagnosis not present

## 2021-05-05 DIAGNOSIS — I2510 Atherosclerotic heart disease of native coronary artery without angina pectoris: Secondary | ICD-10-CM | POA: Diagnosis not present

## 2021-05-05 DIAGNOSIS — I480 Paroxysmal atrial fibrillation: Secondary | ICD-10-CM | POA: Diagnosis not present

## 2021-05-05 DIAGNOSIS — J4520 Mild intermittent asthma, uncomplicated: Secondary | ICD-10-CM | POA: Diagnosis not present

## 2021-05-09 DIAGNOSIS — I5032 Chronic diastolic (congestive) heart failure: Secondary | ICD-10-CM | POA: Diagnosis not present

## 2021-05-09 DIAGNOSIS — J4520 Mild intermittent asthma, uncomplicated: Secondary | ICD-10-CM | POA: Diagnosis not present

## 2021-05-09 DIAGNOSIS — I110 Hypertensive heart disease with heart failure: Secondary | ICD-10-CM | POA: Diagnosis not present

## 2021-05-09 DIAGNOSIS — I480 Paroxysmal atrial fibrillation: Secondary | ICD-10-CM | POA: Diagnosis not present

## 2021-05-09 DIAGNOSIS — I700 Atherosclerosis of aorta: Secondary | ICD-10-CM | POA: Diagnosis not present

## 2021-05-09 DIAGNOSIS — I2510 Atherosclerotic heart disease of native coronary artery without angina pectoris: Secondary | ICD-10-CM | POA: Diagnosis not present

## 2021-05-21 ENCOUNTER — Telehealth: Payer: Self-pay | Admitting: Physician Assistant

## 2021-05-21 DIAGNOSIS — I2510 Atherosclerotic heart disease of native coronary artery without angina pectoris: Secondary | ICD-10-CM | POA: Diagnosis not present

## 2021-05-21 DIAGNOSIS — J4520 Mild intermittent asthma, uncomplicated: Secondary | ICD-10-CM | POA: Diagnosis not present

## 2021-05-21 DIAGNOSIS — I5032 Chronic diastolic (congestive) heart failure: Secondary | ICD-10-CM | POA: Diagnosis not present

## 2021-05-21 DIAGNOSIS — I110 Hypertensive heart disease with heart failure: Secondary | ICD-10-CM | POA: Diagnosis not present

## 2021-05-21 DIAGNOSIS — I700 Atherosclerosis of aorta: Secondary | ICD-10-CM | POA: Diagnosis not present

## 2021-05-21 DIAGNOSIS — I480 Paroxysmal atrial fibrillation: Secondary | ICD-10-CM | POA: Diagnosis not present

## 2021-05-21 NOTE — Telephone Encounter (Signed)
Received page from physical therapist regarding patient low blood pressure for past 1 week.  Its been running in the 90s/50s.  Most recent reading 94/52 this morning.  Patient been feeling poorly for past week. Patient at facility.  I have recommended to reduce Cardizem CD to 120 mg daily.  However, therapist recommended order to facility.  I have advised to call Monday after 8 AM to send order to facility.  Recommended evaluation by PCP or provider at facility.

## 2021-05-25 DIAGNOSIS — I700 Atherosclerosis of aorta: Secondary | ICD-10-CM | POA: Diagnosis not present

## 2021-05-25 DIAGNOSIS — I480 Paroxysmal atrial fibrillation: Secondary | ICD-10-CM | POA: Diagnosis not present

## 2021-05-25 DIAGNOSIS — J4520 Mild intermittent asthma, uncomplicated: Secondary | ICD-10-CM | POA: Diagnosis not present

## 2021-05-25 DIAGNOSIS — I5032 Chronic diastolic (congestive) heart failure: Secondary | ICD-10-CM | POA: Diagnosis not present

## 2021-05-25 DIAGNOSIS — I2510 Atherosclerotic heart disease of native coronary artery without angina pectoris: Secondary | ICD-10-CM | POA: Diagnosis not present

## 2021-05-25 DIAGNOSIS — I110 Hypertensive heart disease with heart failure: Secondary | ICD-10-CM | POA: Diagnosis not present

## 2021-05-29 DIAGNOSIS — I110 Hypertensive heart disease with heart failure: Secondary | ICD-10-CM | POA: Diagnosis not present

## 2021-05-29 DIAGNOSIS — Z9181 History of falling: Secondary | ICD-10-CM | POA: Diagnosis not present

## 2021-05-29 DIAGNOSIS — I2510 Atherosclerotic heart disease of native coronary artery without angina pectoris: Secondary | ICD-10-CM | POA: Diagnosis not present

## 2021-05-29 DIAGNOSIS — Z6827 Body mass index (BMI) 27.0-27.9, adult: Secondary | ICD-10-CM | POA: Diagnosis not present

## 2021-05-29 DIAGNOSIS — E663 Overweight: Secondary | ICD-10-CM | POA: Diagnosis not present

## 2021-05-29 DIAGNOSIS — M1990 Unspecified osteoarthritis, unspecified site: Secondary | ICD-10-CM | POA: Diagnosis not present

## 2021-05-29 DIAGNOSIS — J4520 Mild intermittent asthma, uncomplicated: Secondary | ICD-10-CM | POA: Diagnosis not present

## 2021-05-29 DIAGNOSIS — I480 Paroxysmal atrial fibrillation: Secondary | ICD-10-CM | POA: Diagnosis not present

## 2021-05-29 DIAGNOSIS — I700 Atherosclerosis of aorta: Secondary | ICD-10-CM | POA: Diagnosis not present

## 2021-05-29 DIAGNOSIS — I5032 Chronic diastolic (congestive) heart failure: Secondary | ICD-10-CM | POA: Diagnosis not present

## 2021-05-29 DIAGNOSIS — F39 Unspecified mood [affective] disorder: Secondary | ICD-10-CM | POA: Diagnosis not present

## 2021-06-03 DIAGNOSIS — I2510 Atherosclerotic heart disease of native coronary artery without angina pectoris: Secondary | ICD-10-CM | POA: Diagnosis not present

## 2021-06-03 DIAGNOSIS — J4520 Mild intermittent asthma, uncomplicated: Secondary | ICD-10-CM | POA: Diagnosis not present

## 2021-06-03 DIAGNOSIS — I5032 Chronic diastolic (congestive) heart failure: Secondary | ICD-10-CM | POA: Diagnosis not present

## 2021-06-03 DIAGNOSIS — I480 Paroxysmal atrial fibrillation: Secondary | ICD-10-CM | POA: Diagnosis not present

## 2021-06-03 DIAGNOSIS — I110 Hypertensive heart disease with heart failure: Secondary | ICD-10-CM | POA: Diagnosis not present

## 2021-06-03 DIAGNOSIS — I700 Atherosclerosis of aorta: Secondary | ICD-10-CM | POA: Diagnosis not present

## 2021-06-06 ENCOUNTER — Telehealth: Payer: Self-pay

## 2021-06-06 NOTE — Telephone Encounter (Signed)
I called the pt back to f/u on her symptoms. She informed me that her symptoms subsided after getting in the shower this morning. She said the cough is chronic and she is unsure if the bodyaches is related to how she slept. She declined an appointment, but requested that you send in [facility]. She said she take them PRN for the chronic cough. She is currently at [facility] and unsure what pharmacy they uses. I attempted to contact [facility], no answer. I will call back tomorrow to verify the pharmacy if necessary.

## 2021-06-06 NOTE — Telephone Encounter (Signed)
Copied from [place] [id number]. Topic: Appointment Scheduling - Scheduling Inquiry for Clinic >> Jun 06, 2021 10:28 AM Celene Kras wrote: Reason for CRM: Pt called stating that she has a cough, chills, body aches, headache. She states that is needing to have an appt before next available with PCP on 06/10/21. Please advise.

## 2021-06-07 DIAGNOSIS — J4520 Mild intermittent asthma, uncomplicated: Secondary | ICD-10-CM | POA: Diagnosis not present

## 2021-06-07 DIAGNOSIS — I110 Hypertensive heart disease with heart failure: Secondary | ICD-10-CM | POA: Diagnosis not present

## 2021-06-07 DIAGNOSIS — I700 Atherosclerosis of aorta: Secondary | ICD-10-CM | POA: Diagnosis not present

## 2021-06-07 DIAGNOSIS — I2510 Atherosclerotic heart disease of native coronary artery without angina pectoris: Secondary | ICD-10-CM | POA: Diagnosis not present

## 2021-06-07 DIAGNOSIS — I5032 Chronic diastolic (congestive) heart failure: Secondary | ICD-10-CM | POA: Diagnosis not present

## 2021-06-07 DIAGNOSIS — I480 Paroxysmal atrial fibrillation: Secondary | ICD-10-CM | POA: Diagnosis not present

## 2021-06-07 NOTE — Telephone Encounter (Signed)
Noted, will send in [place] once we figure out the pharmacy

## 2021-06-15 DIAGNOSIS — I110 Hypertensive heart disease with heart failure: Secondary | ICD-10-CM | POA: Diagnosis not present

## 2021-06-15 DIAGNOSIS — I2510 Atherosclerotic heart disease of native coronary artery without angina pectoris: Secondary | ICD-10-CM | POA: Diagnosis not present

## 2021-06-15 DIAGNOSIS — I5032 Chronic diastolic (congestive) heart failure: Secondary | ICD-10-CM | POA: Diagnosis not present

## 2021-06-15 DIAGNOSIS — I480 Paroxysmal atrial fibrillation: Secondary | ICD-10-CM | POA: Diagnosis not present

## 2021-06-15 DIAGNOSIS — I700 Atherosclerosis of aorta: Secondary | ICD-10-CM | POA: Diagnosis not present

## 2021-06-15 DIAGNOSIS — J4520 Mild intermittent asthma, uncomplicated: Secondary | ICD-10-CM | POA: Diagnosis not present

## 2021-06-20 DIAGNOSIS — I700 Atherosclerosis of aorta: Secondary | ICD-10-CM | POA: Diagnosis not present

## 2021-06-20 DIAGNOSIS — I2510 Atherosclerotic heart disease of native coronary artery without angina pectoris: Secondary | ICD-10-CM | POA: Diagnosis not present

## 2021-06-20 DIAGNOSIS — I110 Hypertensive heart disease with heart failure: Secondary | ICD-10-CM | POA: Diagnosis not present

## 2021-06-20 DIAGNOSIS — J4520 Mild intermittent asthma, uncomplicated: Secondary | ICD-10-CM | POA: Diagnosis not present

## 2021-06-20 DIAGNOSIS — I5032 Chronic diastolic (congestive) heart failure: Secondary | ICD-10-CM | POA: Diagnosis not present

## 2021-06-20 DIAGNOSIS — I480 Paroxysmal atrial fibrillation: Secondary | ICD-10-CM | POA: Diagnosis not present

## 2021-06-21 ENCOUNTER — Telehealth: Payer: Self-pay | Admitting: Internal Medicine

## 2021-06-21 NOTE — Telephone Encounter (Signed)
noted 

## 2021-06-21 NOTE — Telephone Encounter (Signed)
Patient discharged from home health therapy on yesterday 09.26.22

## 2021-06-24 ENCOUNTER — Other Ambulatory Visit: Payer: Self-pay | Admitting: Internal Medicine

## 2021-07-08 DIAGNOSIS — M159 Polyosteoarthritis, unspecified: Secondary | ICD-10-CM | POA: Diagnosis not present

## 2021-07-08 DIAGNOSIS — E785 Hyperlipidemia, unspecified: Secondary | ICD-10-CM | POA: Diagnosis not present

## 2021-07-08 DIAGNOSIS — I509 Heart failure, unspecified: Secondary | ICD-10-CM | POA: Diagnosis not present

## 2021-07-08 DIAGNOSIS — I110 Hypertensive heart disease with heart failure: Secondary | ICD-10-CM | POA: Diagnosis not present

## 2021-07-08 DIAGNOSIS — M810 Age-related osteoporosis without current pathological fracture: Secondary | ICD-10-CM | POA: Diagnosis not present

## 2021-07-08 DIAGNOSIS — J45909 Unspecified asthma, uncomplicated: Secondary | ICD-10-CM | POA: Diagnosis not present

## 2021-07-08 DIAGNOSIS — I4891 Unspecified atrial fibrillation: Secondary | ICD-10-CM | POA: Diagnosis not present

## 2021-07-12 DIAGNOSIS — I110 Hypertensive heart disease with heart failure: Secondary | ICD-10-CM | POA: Diagnosis not present

## 2021-07-12 DIAGNOSIS — I700 Atherosclerosis of aorta: Secondary | ICD-10-CM | POA: Diagnosis not present

## 2021-07-12 DIAGNOSIS — M810 Age-related osteoporosis without current pathological fracture: Secondary | ICD-10-CM | POA: Diagnosis not present

## 2021-07-12 DIAGNOSIS — M159 Polyosteoarthritis, unspecified: Secondary | ICD-10-CM | POA: Diagnosis not present

## 2021-07-12 DIAGNOSIS — I4891 Unspecified atrial fibrillation: Secondary | ICD-10-CM | POA: Diagnosis not present

## 2021-07-12 DIAGNOSIS — Z4800 Encounter for change or removal of nonsurgical wound dressing: Secondary | ICD-10-CM | POA: Diagnosis not present

## 2021-07-12 DIAGNOSIS — J45909 Unspecified asthma, uncomplicated: Secondary | ICD-10-CM | POA: Diagnosis not present

## 2021-07-12 DIAGNOSIS — I509 Heart failure, unspecified: Secondary | ICD-10-CM | POA: Diagnosis not present

## 2021-07-12 DIAGNOSIS — S90411D Abrasion, right great toe, subsequent encounter: Secondary | ICD-10-CM | POA: Diagnosis not present

## 2021-07-12 DIAGNOSIS — F0631 Mood disorder due to known physiological condition with depressive features: Secondary | ICD-10-CM | POA: Diagnosis not present

## 2021-07-12 DIAGNOSIS — E785 Hyperlipidemia, unspecified: Secondary | ICD-10-CM | POA: Diagnosis not present

## 2021-07-12 DIAGNOSIS — Z9181 History of falling: Secondary | ICD-10-CM | POA: Diagnosis not present

## 2021-07-12 DIAGNOSIS — Z96653 Presence of artificial knee joint, bilateral: Secondary | ICD-10-CM | POA: Diagnosis not present

## 2021-07-14 DIAGNOSIS — I509 Heart failure, unspecified: Secondary | ICD-10-CM | POA: Diagnosis not present

## 2021-07-14 DIAGNOSIS — I4891 Unspecified atrial fibrillation: Secondary | ICD-10-CM | POA: Diagnosis not present

## 2021-07-14 DIAGNOSIS — M810 Age-related osteoporosis without current pathological fracture: Secondary | ICD-10-CM | POA: Diagnosis not present

## 2021-07-14 DIAGNOSIS — Z4800 Encounter for change or removal of nonsurgical wound dressing: Secondary | ICD-10-CM | POA: Diagnosis not present

## 2021-07-14 DIAGNOSIS — L02511 Cutaneous abscess of right hand: Secondary | ICD-10-CM | POA: Diagnosis not present

## 2021-07-14 DIAGNOSIS — S90411D Abrasion, right great toe, subsequent encounter: Secondary | ICD-10-CM | POA: Diagnosis not present

## 2021-07-14 DIAGNOSIS — I110 Hypertensive heart disease with heart failure: Secondary | ICD-10-CM | POA: Diagnosis not present

## 2021-07-15 ENCOUNTER — Ambulatory Visit: Payer: TRICARE For Life (TFL) | Admitting: Cardiovascular Disease

## 2021-07-15 DIAGNOSIS — L03114 Cellulitis of left upper limb: Secondary | ICD-10-CM | POA: Diagnosis not present

## 2021-07-18 DIAGNOSIS — S90411D Abrasion, right great toe, subsequent encounter: Secondary | ICD-10-CM | POA: Diagnosis not present

## 2021-07-18 DIAGNOSIS — I7389 Other specified peripheral vascular diseases: Secondary | ICD-10-CM | POA: Diagnosis not present

## 2021-07-18 DIAGNOSIS — L84 Corns and callosities: Secondary | ICD-10-CM | POA: Diagnosis not present

## 2021-07-18 DIAGNOSIS — I4891 Unspecified atrial fibrillation: Secondary | ICD-10-CM | POA: Diagnosis not present

## 2021-07-18 DIAGNOSIS — B351 Tinea unguium: Secondary | ICD-10-CM | POA: Diagnosis not present

## 2021-07-18 DIAGNOSIS — I110 Hypertensive heart disease with heart failure: Secondary | ICD-10-CM | POA: Diagnosis not present

## 2021-07-18 DIAGNOSIS — M2040 Other hammer toe(s) (acquired), unspecified foot: Secondary | ICD-10-CM | POA: Diagnosis not present

## 2021-07-18 DIAGNOSIS — Z4800 Encounter for change or removal of nonsurgical wound dressing: Secondary | ICD-10-CM | POA: Diagnosis not present

## 2021-07-18 DIAGNOSIS — I509 Heart failure, unspecified: Secondary | ICD-10-CM | POA: Diagnosis not present

## 2021-07-18 DIAGNOSIS — R600 Localized edema: Secondary | ICD-10-CM | POA: Diagnosis not present

## 2021-07-18 DIAGNOSIS — M810 Age-related osteoporosis without current pathological fracture: Secondary | ICD-10-CM | POA: Diagnosis not present

## 2021-07-19 DIAGNOSIS — Z4800 Encounter for change or removal of nonsurgical wound dressing: Secondary | ICD-10-CM | POA: Diagnosis not present

## 2021-07-19 DIAGNOSIS — I4891 Unspecified atrial fibrillation: Secondary | ICD-10-CM | POA: Diagnosis not present

## 2021-07-19 DIAGNOSIS — M810 Age-related osteoporosis without current pathological fracture: Secondary | ICD-10-CM | POA: Diagnosis not present

## 2021-07-19 DIAGNOSIS — I509 Heart failure, unspecified: Secondary | ICD-10-CM | POA: Diagnosis not present

## 2021-07-19 DIAGNOSIS — S90411D Abrasion, right great toe, subsequent encounter: Secondary | ICD-10-CM | POA: Diagnosis not present

## 2021-07-19 DIAGNOSIS — I110 Hypertensive heart disease with heart failure: Secondary | ICD-10-CM | POA: Diagnosis not present

## 2021-07-21 DIAGNOSIS — I509 Heart failure, unspecified: Secondary | ICD-10-CM | POA: Diagnosis not present

## 2021-07-21 DIAGNOSIS — G8929 Other chronic pain: Secondary | ICD-10-CM | POA: Diagnosis not present

## 2021-07-21 DIAGNOSIS — S90411D Abrasion, right great toe, subsequent encounter: Secondary | ICD-10-CM | POA: Diagnosis not present

## 2021-07-21 DIAGNOSIS — M542 Cervicalgia: Secondary | ICD-10-CM | POA: Diagnosis not present

## 2021-07-21 DIAGNOSIS — I110 Hypertensive heart disease with heart failure: Secondary | ICD-10-CM | POA: Diagnosis not present

## 2021-07-21 DIAGNOSIS — M48061 Spinal stenosis, lumbar region without neurogenic claudication: Secondary | ICD-10-CM | POA: Diagnosis not present

## 2021-07-21 DIAGNOSIS — Z8739 Personal history of other diseases of the musculoskeletal system and connective tissue: Secondary | ICD-10-CM | POA: Diagnosis not present

## 2021-07-21 DIAGNOSIS — I10 Essential (primary) hypertension: Secondary | ICD-10-CM | POA: Diagnosis not present

## 2021-07-21 DIAGNOSIS — Z8781 Personal history of (healed) traumatic fracture: Secondary | ICD-10-CM | POA: Diagnosis not present

## 2021-07-21 DIAGNOSIS — M8008XD Age-related osteoporosis with current pathological fracture, vertebra(e), subsequent encounter for fracture with routine healing: Secondary | ICD-10-CM | POA: Diagnosis not present

## 2021-07-21 DIAGNOSIS — M4156 Other secondary scoliosis, lumbar region: Secondary | ICD-10-CM | POA: Diagnosis not present

## 2021-07-21 DIAGNOSIS — M5136 Other intervertebral disc degeneration, lumbar region: Secondary | ICD-10-CM | POA: Diagnosis not present

## 2021-07-21 DIAGNOSIS — M5459 Other low back pain: Secondary | ICD-10-CM | POA: Diagnosis not present

## 2021-07-21 DIAGNOSIS — I4891 Unspecified atrial fibrillation: Secondary | ICD-10-CM | POA: Diagnosis not present

## 2021-07-21 DIAGNOSIS — M810 Age-related osteoporosis without current pathological fracture: Secondary | ICD-10-CM | POA: Diagnosis not present

## 2021-07-21 DIAGNOSIS — Z4800 Encounter for change or removal of nonsurgical wound dressing: Secondary | ICD-10-CM | POA: Diagnosis not present

## 2021-07-23 DIAGNOSIS — I110 Hypertensive heart disease with heart failure: Secondary | ICD-10-CM | POA: Diagnosis not present

## 2021-07-23 DIAGNOSIS — J45909 Unspecified asthma, uncomplicated: Secondary | ICD-10-CM | POA: Diagnosis not present

## 2021-07-23 DIAGNOSIS — I509 Heart failure, unspecified: Secondary | ICD-10-CM | POA: Diagnosis not present

## 2021-07-23 DIAGNOSIS — M159 Polyosteoarthritis, unspecified: Secondary | ICD-10-CM | POA: Diagnosis not present

## 2021-07-23 DIAGNOSIS — F0631 Mood disorder due to known physiological condition with depressive features: Secondary | ICD-10-CM | POA: Diagnosis not present

## 2021-07-23 DIAGNOSIS — M810 Age-related osteoporosis without current pathological fracture: Secondary | ICD-10-CM | POA: Diagnosis not present

## 2021-07-23 DIAGNOSIS — I4891 Unspecified atrial fibrillation: Secondary | ICD-10-CM | POA: Diagnosis not present

## 2021-07-23 DIAGNOSIS — E785 Hyperlipidemia, unspecified: Secondary | ICD-10-CM | POA: Diagnosis not present

## 2021-07-25 DIAGNOSIS — M810 Age-related osteoporosis without current pathological fracture: Secondary | ICD-10-CM | POA: Diagnosis not present

## 2021-07-25 DIAGNOSIS — I509 Heart failure, unspecified: Secondary | ICD-10-CM | POA: Diagnosis not present

## 2021-07-25 DIAGNOSIS — Z4800 Encounter for change or removal of nonsurgical wound dressing: Secondary | ICD-10-CM | POA: Diagnosis not present

## 2021-07-25 DIAGNOSIS — S90411D Abrasion, right great toe, subsequent encounter: Secondary | ICD-10-CM | POA: Diagnosis not present

## 2021-07-25 DIAGNOSIS — I110 Hypertensive heart disease with heart failure: Secondary | ICD-10-CM | POA: Diagnosis not present

## 2021-07-25 DIAGNOSIS — I4891 Unspecified atrial fibrillation: Secondary | ICD-10-CM | POA: Diagnosis not present

## 2021-07-26 DIAGNOSIS — S90411D Abrasion, right great toe, subsequent encounter: Secondary | ICD-10-CM | POA: Diagnosis not present

## 2021-07-26 DIAGNOSIS — I110 Hypertensive heart disease with heart failure: Secondary | ICD-10-CM | POA: Diagnosis not present

## 2021-07-26 DIAGNOSIS — I4891 Unspecified atrial fibrillation: Secondary | ICD-10-CM | POA: Diagnosis not present

## 2021-07-26 DIAGNOSIS — Z4800 Encounter for change or removal of nonsurgical wound dressing: Secondary | ICD-10-CM | POA: Diagnosis not present

## 2021-07-26 DIAGNOSIS — I509 Heart failure, unspecified: Secondary | ICD-10-CM | POA: Diagnosis not present

## 2021-07-26 DIAGNOSIS — M810 Age-related osteoporosis without current pathological fracture: Secondary | ICD-10-CM | POA: Diagnosis not present

## 2021-07-27 DIAGNOSIS — Z4800 Encounter for change or removal of nonsurgical wound dressing: Secondary | ICD-10-CM | POA: Diagnosis not present

## 2021-07-27 DIAGNOSIS — I509 Heart failure, unspecified: Secondary | ICD-10-CM | POA: Diagnosis not present

## 2021-07-27 DIAGNOSIS — I110 Hypertensive heart disease with heart failure: Secondary | ICD-10-CM | POA: Diagnosis not present

## 2021-07-27 DIAGNOSIS — S90411D Abrasion, right great toe, subsequent encounter: Secondary | ICD-10-CM | POA: Diagnosis not present

## 2021-07-27 DIAGNOSIS — M810 Age-related osteoporosis without current pathological fracture: Secondary | ICD-10-CM | POA: Diagnosis not present

## 2021-07-27 DIAGNOSIS — I4891 Unspecified atrial fibrillation: Secondary | ICD-10-CM | POA: Diagnosis not present

## 2021-07-28 DIAGNOSIS — M810 Age-related osteoporosis without current pathological fracture: Secondary | ICD-10-CM | POA: Diagnosis not present

## 2021-07-28 DIAGNOSIS — I4891 Unspecified atrial fibrillation: Secondary | ICD-10-CM | POA: Diagnosis not present

## 2021-07-28 DIAGNOSIS — I509 Heart failure, unspecified: Secondary | ICD-10-CM | POA: Diagnosis not present

## 2021-07-28 DIAGNOSIS — I110 Hypertensive heart disease with heart failure: Secondary | ICD-10-CM | POA: Diagnosis not present

## 2021-07-28 DIAGNOSIS — Z4800 Encounter for change or removal of nonsurgical wound dressing: Secondary | ICD-10-CM | POA: Diagnosis not present

## 2021-07-28 DIAGNOSIS — S90411D Abrasion, right great toe, subsequent encounter: Secondary | ICD-10-CM | POA: Diagnosis not present

## 2021-07-29 DIAGNOSIS — L03114 Cellulitis of left upper limb: Secondary | ICD-10-CM | POA: Diagnosis not present

## 2021-08-02 DIAGNOSIS — I509 Heart failure, unspecified: Secondary | ICD-10-CM | POA: Diagnosis not present

## 2021-08-02 DIAGNOSIS — I4891 Unspecified atrial fibrillation: Secondary | ICD-10-CM | POA: Diagnosis not present

## 2021-08-02 DIAGNOSIS — M810 Age-related osteoporosis without current pathological fracture: Secondary | ICD-10-CM | POA: Diagnosis not present

## 2021-08-02 DIAGNOSIS — Z4800 Encounter for change or removal of nonsurgical wound dressing: Secondary | ICD-10-CM | POA: Diagnosis not present

## 2021-08-02 DIAGNOSIS — S90411D Abrasion, right great toe, subsequent encounter: Secondary | ICD-10-CM | POA: Diagnosis not present

## 2021-08-02 DIAGNOSIS — I110 Hypertensive heart disease with heart failure: Secondary | ICD-10-CM | POA: Diagnosis not present

## 2021-08-03 DIAGNOSIS — I4891 Unspecified atrial fibrillation: Secondary | ICD-10-CM | POA: Diagnosis not present

## 2021-08-03 DIAGNOSIS — I110 Hypertensive heart disease with heart failure: Secondary | ICD-10-CM | POA: Diagnosis not present

## 2021-08-03 DIAGNOSIS — S90411D Abrasion, right great toe, subsequent encounter: Secondary | ICD-10-CM | POA: Diagnosis not present

## 2021-08-03 DIAGNOSIS — I509 Heart failure, unspecified: Secondary | ICD-10-CM | POA: Diagnosis not present

## 2021-08-03 DIAGNOSIS — M810 Age-related osteoporosis without current pathological fracture: Secondary | ICD-10-CM | POA: Diagnosis not present

## 2021-08-03 DIAGNOSIS — Z4800 Encounter for change or removal of nonsurgical wound dressing: Secondary | ICD-10-CM | POA: Diagnosis not present

## 2021-08-04 DIAGNOSIS — I110 Hypertensive heart disease with heart failure: Secondary | ICD-10-CM | POA: Diagnosis not present

## 2021-08-04 DIAGNOSIS — S90411D Abrasion, right great toe, subsequent encounter: Secondary | ICD-10-CM | POA: Diagnosis not present

## 2021-08-04 DIAGNOSIS — Z4800 Encounter for change or removal of nonsurgical wound dressing: Secondary | ICD-10-CM | POA: Diagnosis not present

## 2021-08-04 DIAGNOSIS — M810 Age-related osteoporosis without current pathological fracture: Secondary | ICD-10-CM | POA: Diagnosis not present

## 2021-08-04 DIAGNOSIS — I509 Heart failure, unspecified: Secondary | ICD-10-CM | POA: Diagnosis not present

## 2021-08-04 DIAGNOSIS — I4891 Unspecified atrial fibrillation: Secondary | ICD-10-CM | POA: Diagnosis not present

## 2021-08-06 DIAGNOSIS — R1010 Upper abdominal pain, unspecified: Secondary | ICD-10-CM | POA: Diagnosis not present

## 2021-08-06 DIAGNOSIS — R112 Nausea with vomiting, unspecified: Secondary | ICD-10-CM | POA: Diagnosis not present

## 2021-08-06 DIAGNOSIS — I480 Paroxysmal atrial fibrillation: Secondary | ICD-10-CM | POA: Diagnosis not present

## 2021-08-06 DIAGNOSIS — J45909 Unspecified asthma, uncomplicated: Secondary | ICD-10-CM | POA: Diagnosis not present

## 2021-08-06 DIAGNOSIS — J690 Pneumonitis due to inhalation of food and vomit: Secondary | ICD-10-CM | POA: Diagnosis not present

## 2021-08-06 DIAGNOSIS — I2584 Coronary atherosclerosis due to calcified coronary lesion: Secondary | ICD-10-CM | POA: Diagnosis not present

## 2021-08-06 DIAGNOSIS — F329 Major depressive disorder, single episode, unspecified: Secondary | ICD-10-CM | POA: Diagnosis not present

## 2021-08-06 DIAGNOSIS — K449 Diaphragmatic hernia without obstruction or gangrene: Secondary | ICD-10-CM | POA: Diagnosis not present

## 2021-08-06 DIAGNOSIS — J9601 Acute respiratory failure with hypoxia: Secondary | ICD-10-CM | POA: Diagnosis not present

## 2021-08-06 DIAGNOSIS — Z66 Do not resuscitate: Secondary | ICD-10-CM | POA: Diagnosis not present

## 2021-08-07 DIAGNOSIS — Z79899 Other long term (current) drug therapy: Secondary | ICD-10-CM | POA: Diagnosis not present

## 2021-08-07 DIAGNOSIS — Z886 Allergy status to analgesic agent status: Secondary | ICD-10-CM | POA: Diagnosis not present

## 2021-08-07 DIAGNOSIS — Z7901 Long term (current) use of anticoagulants: Secondary | ICD-10-CM | POA: Diagnosis not present

## 2021-08-07 DIAGNOSIS — Z66 Do not resuscitate: Secondary | ICD-10-CM | POA: Diagnosis present

## 2021-08-07 DIAGNOSIS — J9601 Acute respiratory failure with hypoxia: Secondary | ICD-10-CM | POA: Diagnosis not present

## 2021-08-07 DIAGNOSIS — E049 Nontoxic goiter, unspecified: Secondary | ICD-10-CM | POA: Diagnosis present

## 2021-08-07 DIAGNOSIS — Z791 Long term (current) use of non-steroidal anti-inflammatories (NSAID): Secondary | ICD-10-CM | POA: Diagnosis not present

## 2021-08-07 DIAGNOSIS — Z7951 Long term (current) use of inhaled steroids: Secondary | ICD-10-CM | POA: Diagnosis not present

## 2021-08-07 DIAGNOSIS — I10 Essential (primary) hypertension: Secondary | ICD-10-CM | POA: Diagnosis present

## 2021-08-07 DIAGNOSIS — N739 Female pelvic inflammatory disease, unspecified: Secondary | ICD-10-CM | POA: Diagnosis present

## 2021-08-07 DIAGNOSIS — J690 Pneumonitis due to inhalation of food and vomit: Secondary | ICD-10-CM | POA: Diagnosis present

## 2021-08-07 DIAGNOSIS — F32A Depression, unspecified: Secondary | ICD-10-CM | POA: Diagnosis present

## 2021-08-07 DIAGNOSIS — J45909 Unspecified asthma, uncomplicated: Secondary | ICD-10-CM | POA: Diagnosis not present

## 2021-08-07 DIAGNOSIS — K219 Gastro-esophageal reflux disease without esophagitis: Secondary | ICD-10-CM | POA: Diagnosis present

## 2021-08-07 DIAGNOSIS — K449 Diaphragmatic hernia without obstruction or gangrene: Secondary | ICD-10-CM | POA: Diagnosis not present

## 2021-08-07 DIAGNOSIS — Z8701 Personal history of pneumonia (recurrent): Secondary | ICD-10-CM | POA: Diagnosis not present

## 2021-08-07 DIAGNOSIS — R9431 Abnormal electrocardiogram [ECG] [EKG]: Secondary | ICD-10-CM | POA: Diagnosis not present

## 2021-08-07 DIAGNOSIS — I480 Paroxysmal atrial fibrillation: Secondary | ICD-10-CM | POA: Diagnosis not present

## 2021-08-07 DIAGNOSIS — F329 Major depressive disorder, single episode, unspecified: Secondary | ICD-10-CM | POA: Diagnosis present

## 2021-08-08 DIAGNOSIS — R9431 Abnormal electrocardiogram [ECG] [EKG]: Secondary | ICD-10-CM | POA: Diagnosis not present

## 2021-08-09 DIAGNOSIS — Q790 Congenital diaphragmatic hernia: Secondary | ICD-10-CM | POA: Diagnosis not present

## 2021-08-09 DIAGNOSIS — J17 Pneumonia in diseases classified elsewhere: Secondary | ICD-10-CM | POA: Diagnosis not present

## 2021-08-09 DIAGNOSIS — E739 Lactose intolerance, unspecified: Secondary | ICD-10-CM | POA: Diagnosis not present

## 2021-08-09 DIAGNOSIS — R2689 Other abnormalities of gait and mobility: Secondary | ICD-10-CM | POA: Diagnosis not present

## 2021-08-11 DIAGNOSIS — J45909 Unspecified asthma, uncomplicated: Secondary | ICD-10-CM | POA: Diagnosis not present

## 2021-08-11 DIAGNOSIS — I700 Atherosclerosis of aorta: Secondary | ICD-10-CM | POA: Diagnosis not present

## 2021-08-11 DIAGNOSIS — M810 Age-related osteoporosis without current pathological fracture: Secondary | ICD-10-CM | POA: Diagnosis not present

## 2021-08-11 DIAGNOSIS — I110 Hypertensive heart disease with heart failure: Secondary | ICD-10-CM | POA: Diagnosis not present

## 2021-08-11 DIAGNOSIS — Z4800 Encounter for change or removal of nonsurgical wound dressing: Secondary | ICD-10-CM | POA: Diagnosis not present

## 2021-08-11 DIAGNOSIS — F0631 Mood disorder due to known physiological condition with depressive features: Secondary | ICD-10-CM | POA: Diagnosis not present

## 2021-08-11 DIAGNOSIS — E785 Hyperlipidemia, unspecified: Secondary | ICD-10-CM | POA: Diagnosis not present

## 2021-08-11 DIAGNOSIS — M159 Polyosteoarthritis, unspecified: Secondary | ICD-10-CM | POA: Diagnosis not present

## 2021-08-11 DIAGNOSIS — I509 Heart failure, unspecified: Secondary | ICD-10-CM | POA: Diagnosis not present

## 2021-08-11 DIAGNOSIS — Z9181 History of falling: Secondary | ICD-10-CM | POA: Diagnosis not present

## 2021-08-11 DIAGNOSIS — S90411D Abrasion, right great toe, subsequent encounter: Secondary | ICD-10-CM | POA: Diagnosis not present

## 2021-08-11 DIAGNOSIS — I4891 Unspecified atrial fibrillation: Secondary | ICD-10-CM | POA: Diagnosis not present

## 2021-08-11 DIAGNOSIS — Z96653 Presence of artificial knee joint, bilateral: Secondary | ICD-10-CM | POA: Diagnosis not present

## 2021-08-23 DIAGNOSIS — Z4800 Encounter for change or removal of nonsurgical wound dressing: Secondary | ICD-10-CM | POA: Diagnosis not present

## 2021-08-23 DIAGNOSIS — I4891 Unspecified atrial fibrillation: Secondary | ICD-10-CM | POA: Diagnosis not present

## 2021-08-23 DIAGNOSIS — I509 Heart failure, unspecified: Secondary | ICD-10-CM | POA: Diagnosis not present

## 2021-08-23 DIAGNOSIS — S90411D Abrasion, right great toe, subsequent encounter: Secondary | ICD-10-CM | POA: Diagnosis not present

## 2021-08-23 DIAGNOSIS — I110 Hypertensive heart disease with heart failure: Secondary | ICD-10-CM | POA: Diagnosis not present

## 2021-08-23 DIAGNOSIS — M810 Age-related osteoporosis without current pathological fracture: Secondary | ICD-10-CM | POA: Diagnosis not present

## 2021-08-24 DIAGNOSIS — J17 Pneumonia in diseases classified elsewhere: Secondary | ICD-10-CM | POA: Diagnosis not present

## 2021-08-24 DIAGNOSIS — I110 Hypertensive heart disease with heart failure: Secondary | ICD-10-CM | POA: Diagnosis not present

## 2021-08-24 DIAGNOSIS — J45909 Unspecified asthma, uncomplicated: Secondary | ICD-10-CM | POA: Diagnosis not present

## 2021-08-24 DIAGNOSIS — I4891 Unspecified atrial fibrillation: Secondary | ICD-10-CM | POA: Diagnosis not present

## 2021-08-24 DIAGNOSIS — M810 Age-related osteoporosis without current pathological fracture: Secondary | ICD-10-CM | POA: Diagnosis not present

## 2021-08-24 DIAGNOSIS — E785 Hyperlipidemia, unspecified: Secondary | ICD-10-CM | POA: Diagnosis not present

## 2021-08-24 DIAGNOSIS — Z4800 Encounter for change or removal of nonsurgical wound dressing: Secondary | ICD-10-CM | POA: Diagnosis not present

## 2021-08-24 DIAGNOSIS — I509 Heart failure, unspecified: Secondary | ICD-10-CM | POA: Diagnosis not present

## 2021-08-24 DIAGNOSIS — S90411D Abrasion, right great toe, subsequent encounter: Secondary | ICD-10-CM | POA: Diagnosis not present

## 2021-08-24 DIAGNOSIS — F0631 Mood disorder due to known physiological condition with depressive features: Secondary | ICD-10-CM | POA: Diagnosis not present

## 2021-08-24 DIAGNOSIS — M159 Polyosteoarthritis, unspecified: Secondary | ICD-10-CM | POA: Diagnosis not present

## 2021-08-28 DIAGNOSIS — I4891 Unspecified atrial fibrillation: Secondary | ICD-10-CM | POA: Diagnosis not present

## 2021-08-28 DIAGNOSIS — I110 Hypertensive heart disease with heart failure: Secondary | ICD-10-CM | POA: Diagnosis not present

## 2021-08-28 DIAGNOSIS — Z4800 Encounter for change or removal of nonsurgical wound dressing: Secondary | ICD-10-CM | POA: Diagnosis not present

## 2021-08-28 DIAGNOSIS — S90411D Abrasion, right great toe, subsequent encounter: Secondary | ICD-10-CM | POA: Diagnosis not present

## 2021-08-28 DIAGNOSIS — M810 Age-related osteoporosis without current pathological fracture: Secondary | ICD-10-CM | POA: Diagnosis not present

## 2021-08-28 DIAGNOSIS — I509 Heart failure, unspecified: Secondary | ICD-10-CM | POA: Diagnosis not present

## 2021-09-01 DIAGNOSIS — I4891 Unspecified atrial fibrillation: Secondary | ICD-10-CM | POA: Diagnosis not present

## 2021-09-01 DIAGNOSIS — M810 Age-related osteoporosis without current pathological fracture: Secondary | ICD-10-CM | POA: Diagnosis not present

## 2021-09-01 DIAGNOSIS — I110 Hypertensive heart disease with heart failure: Secondary | ICD-10-CM | POA: Diagnosis not present

## 2021-09-01 DIAGNOSIS — Z4800 Encounter for change or removal of nonsurgical wound dressing: Secondary | ICD-10-CM | POA: Diagnosis not present

## 2021-09-01 DIAGNOSIS — S90411D Abrasion, right great toe, subsequent encounter: Secondary | ICD-10-CM | POA: Diagnosis not present

## 2021-09-01 DIAGNOSIS — I509 Heart failure, unspecified: Secondary | ICD-10-CM | POA: Diagnosis not present

## 2021-09-05 DIAGNOSIS — C44311 Basal cell carcinoma of skin of nose: Secondary | ICD-10-CM | POA: Diagnosis not present

## 2021-09-05 DIAGNOSIS — D2239 Melanocytic nevi of other parts of face: Secondary | ICD-10-CM | POA: Diagnosis not present

## 2021-09-05 DIAGNOSIS — D485 Neoplasm of uncertain behavior of skin: Secondary | ICD-10-CM | POA: Diagnosis not present

## 2021-09-05 DIAGNOSIS — L814 Other melanin hyperpigmentation: Secondary | ICD-10-CM | POA: Diagnosis not present

## 2021-09-06 DIAGNOSIS — Z4800 Encounter for change or removal of nonsurgical wound dressing: Secondary | ICD-10-CM | POA: Diagnosis not present

## 2021-09-06 DIAGNOSIS — S90411D Abrasion, right great toe, subsequent encounter: Secondary | ICD-10-CM | POA: Diagnosis not present

## 2021-09-06 DIAGNOSIS — I509 Heart failure, unspecified: Secondary | ICD-10-CM | POA: Diagnosis not present

## 2021-09-06 DIAGNOSIS — I110 Hypertensive heart disease with heart failure: Secondary | ICD-10-CM | POA: Diagnosis not present

## 2021-09-06 DIAGNOSIS — I4891 Unspecified atrial fibrillation: Secondary | ICD-10-CM | POA: Diagnosis not present

## 2021-09-06 DIAGNOSIS — M810 Age-related osteoporosis without current pathological fracture: Secondary | ICD-10-CM | POA: Diagnosis not present

## 2021-09-08 DIAGNOSIS — I4891 Unspecified atrial fibrillation: Secondary | ICD-10-CM | POA: Diagnosis not present

## 2021-09-08 DIAGNOSIS — S90411D Abrasion, right great toe, subsequent encounter: Secondary | ICD-10-CM | POA: Diagnosis not present

## 2021-09-08 DIAGNOSIS — I110 Hypertensive heart disease with heart failure: Secondary | ICD-10-CM | POA: Diagnosis not present

## 2021-09-08 DIAGNOSIS — Z4800 Encounter for change or removal of nonsurgical wound dressing: Secondary | ICD-10-CM | POA: Diagnosis not present

## 2021-09-08 DIAGNOSIS — I509 Heart failure, unspecified: Secondary | ICD-10-CM | POA: Diagnosis not present

## 2021-09-08 DIAGNOSIS — M810 Age-related osteoporosis without current pathological fracture: Secondary | ICD-10-CM | POA: Diagnosis not present

## 2021-09-14 DIAGNOSIS — Z7901 Long term (current) use of anticoagulants: Secondary | ICD-10-CM | POA: Diagnosis not present

## 2021-09-14 DIAGNOSIS — I480 Paroxysmal atrial fibrillation: Secondary | ICD-10-CM | POA: Diagnosis not present

## 2021-12-06 ENCOUNTER — Telehealth: Payer: Self-pay | Admitting: Cardiovascular Disease

## 2021-12-06 ENCOUNTER — Other Ambulatory Visit: Payer: Self-pay | Admitting: Cardiovascular Disease

## 2021-12-06 NOTE — Telephone Encounter (Signed)
Patient has moved to [place] ?

## 2021-12-06 NOTE — Telephone Encounter (Signed)
Please schedule overdue 6 month F/U appointment. Thank you! °

## 2021-12-06 NOTE — Telephone Encounter (Signed)
Patient has moved to [place], deleted recall ?

## 2022-01-30 ENCOUNTER — Encounter: Payer: Medicare Other | Admitting: Internal Medicine
